# Patient Record
Sex: Male | Born: 1940 | Race: White | Hispanic: No | Marital: Married | Smoking: Never smoker
Health system: Southern US, Community
[De-identification: ages and names within clinical notes are randomized; demographics above are authoritative.]

## PROBLEM LIST (undated history)

## (undated) DIAGNOSIS — K922 Gastrointestinal hemorrhage, unspecified: Secondary | ICD-10-CM

## (undated) DIAGNOSIS — I878 Other specified disorders of veins: Secondary | ICD-10-CM

## (undated) DIAGNOSIS — I5022 Chronic systolic (congestive) heart failure: Secondary | ICD-10-CM

## (undated) DIAGNOSIS — G4733 Obstructive sleep apnea (adult) (pediatric): Secondary | ICD-10-CM

## (undated) DIAGNOSIS — Z7289 Other problems related to lifestyle: Secondary | ICD-10-CM

## (undated) DIAGNOSIS — I10 Essential (primary) hypertension: Secondary | ICD-10-CM

## (undated) DIAGNOSIS — I482 Chronic atrial fibrillation, unspecified: Secondary | ICD-10-CM

## (undated) DIAGNOSIS — Z789 Other specified health status: Secondary | ICD-10-CM

## (undated) DIAGNOSIS — I499 Cardiac arrhythmia, unspecified: Secondary | ICD-10-CM

## (undated) DIAGNOSIS — L0390 Cellulitis, unspecified: Secondary | ICD-10-CM

## (undated) HISTORY — DX: Other specified health status: Z78.9

## (undated) HISTORY — DX: Cellulitis, unspecified: L03.90

## (undated) HISTORY — DX: Cardiac arrhythmia, unspecified: I49.9

## (undated) HISTORY — DX: Other problems related to lifestyle: Z72.89

## (undated) HISTORY — DX: Obstructive sleep apnea (adult) (pediatric): G47.33

## (undated) HISTORY — PX: ABDOMINAL SURGERY: SHX537

---

## 2017-06-04 ENCOUNTER — Inpatient Hospital Stay
Admission: EM | Admit: 2017-06-04 | Discharge: 2017-06-10 | DRG: 291 | Disposition: A | Discharge status: Home Health | Payer: Medicare Other | Attending: Family Medicine | Admitting: Family Medicine

## 2017-06-04 ENCOUNTER — Emergency Department: Payer: Medicare Other

## 2017-06-04 ENCOUNTER — Other Ambulatory Visit: Payer: Self-pay

## 2017-06-04 DIAGNOSIS — J918 Pleural effusion in other conditions classified elsewhere: Secondary | ICD-10-CM | POA: Diagnosis present

## 2017-06-04 DIAGNOSIS — J939 Pneumothorax, unspecified: Secondary | ICD-10-CM

## 2017-06-04 DIAGNOSIS — R739 Hyperglycemia, unspecified: Secondary | ICD-10-CM | POA: Diagnosis not present

## 2017-06-04 DIAGNOSIS — R0603 Acute respiratory distress: Secondary | ICD-10-CM | POA: Diagnosis present

## 2017-06-04 DIAGNOSIS — R609 Edema, unspecified: Secondary | ICD-10-CM | POA: Diagnosis not present

## 2017-06-04 DIAGNOSIS — J90 Pleural effusion, not elsewhere classified: Secondary | ICD-10-CM

## 2017-06-04 DIAGNOSIS — I481 Persistent atrial fibrillation: Secondary | ICD-10-CM | POA: Diagnosis present

## 2017-06-04 DIAGNOSIS — M7989 Other specified soft tissue disorders: Secondary | ICD-10-CM | POA: Diagnosis present

## 2017-06-04 DIAGNOSIS — I4891 Unspecified atrial fibrillation: Secondary | ICD-10-CM | POA: Diagnosis not present

## 2017-06-04 DIAGNOSIS — Z888 Allergy status to other drugs, medicaments and biological substances status: Secondary | ICD-10-CM

## 2017-06-04 DIAGNOSIS — L03116 Cellulitis of left lower limb: Secondary | ICD-10-CM | POA: Diagnosis present

## 2017-06-04 DIAGNOSIS — Z8719 Personal history of other diseases of the digestive system: Secondary | ICD-10-CM

## 2017-06-04 DIAGNOSIS — S91209A Unspecified open wound of unspecified toe(s) with damage to nail, initial encounter: Secondary | ICD-10-CM | POA: Diagnosis present

## 2017-06-04 DIAGNOSIS — J189 Pneumonia, unspecified organism: Secondary | ICD-10-CM | POA: Diagnosis present

## 2017-06-04 DIAGNOSIS — Z95828 Presence of other vascular implants and grafts: Secondary | ICD-10-CM

## 2017-06-04 DIAGNOSIS — E876 Hypokalemia: Secondary | ICD-10-CM | POA: Diagnosis not present

## 2017-06-04 DIAGNOSIS — I878 Other specified disorders of veins: Secondary | ICD-10-CM | POA: Diagnosis present

## 2017-06-04 DIAGNOSIS — I5033 Acute on chronic diastolic (congestive) heart failure: Secondary | ICD-10-CM | POA: Diagnosis not present

## 2017-06-04 DIAGNOSIS — X58XXXA Exposure to other specified factors, initial encounter: Secondary | ICD-10-CM | POA: Diagnosis present

## 2017-06-04 DIAGNOSIS — L03115 Cellulitis of right lower limb: Secondary | ICD-10-CM | POA: Diagnosis present

## 2017-06-04 DIAGNOSIS — I482 Chronic atrial fibrillation: Secondary | ICD-10-CM | POA: Diagnosis present

## 2017-06-04 DIAGNOSIS — L089 Local infection of the skin and subcutaneous tissue, unspecified: Secondary | ICD-10-CM | POA: Diagnosis present

## 2017-06-04 DIAGNOSIS — F1010 Alcohol abuse, uncomplicated: Secondary | ICD-10-CM | POA: Diagnosis present

## 2017-06-04 DIAGNOSIS — R0600 Dyspnea, unspecified: Secondary | ICD-10-CM | POA: Diagnosis not present

## 2017-06-04 DIAGNOSIS — E8809 Other disorders of plasma-protein metabolism, not elsewhere classified: Secondary | ICD-10-CM | POA: Diagnosis not present

## 2017-06-04 DIAGNOSIS — I110 Hypertensive heart disease with heart failure: Secondary | ICD-10-CM | POA: Diagnosis present

## 2017-06-04 DIAGNOSIS — J95811 Postprocedural pneumothorax: Secondary | ICD-10-CM | POA: Diagnosis not present

## 2017-06-04 DIAGNOSIS — I509 Heart failure, unspecified: Secondary | ICD-10-CM

## 2017-06-04 HISTORY — DX: Chronic atrial fibrillation, unspecified: I48.20

## 2017-06-04 HISTORY — DX: Other specified disorders of veins: I87.8

## 2017-06-04 HISTORY — DX: Chronic systolic (congestive) heart failure: I50.22

## 2017-06-04 HISTORY — DX: Essential (primary) hypertension: I10

## 2017-06-04 HISTORY — DX: Gastrointestinal hemorrhage, unspecified: K92.2

## 2017-06-04 LAB — CBC WITH DIFFERENTIAL/PLATELET
BASOS ABS: 0.1 10*3/uL (ref 0–0.1)
Basophils Relative: 1 %
EOS ABS: 0.2 10*3/uL (ref 0–0.7)
EOS PCT: 3 %
HCT: 29.9 % — ABNORMAL LOW (ref 40.0–52.0)
HEMOGLOBIN: 9.6 g/dL — AB (ref 13.0–18.0)
Lymphocytes Relative: 15 %
Lymphs Abs: 0.9 10*3/uL — ABNORMAL LOW (ref 1.0–3.6)
MCH: 29.2 pg (ref 26.0–34.0)
MCHC: 32.2 g/dL (ref 32.0–36.0)
MCV: 90.6 fL (ref 80.0–100.0)
Monocytes Absolute: 0.6 10*3/uL (ref 0.2–1.0)
Monocytes Relative: 9 %
NEUTROS PCT: 72 %
Neutro Abs: 4.5 10*3/uL (ref 1.4–6.5)
PLATELETS: 177 10*3/uL (ref 150–440)
RBC: 3.3 MIL/uL — AB (ref 4.40–5.90)
RDW: 15 % — ABNORMAL HIGH (ref 11.5–14.5)
WBC: 6.3 10*3/uL (ref 3.8–10.6)

## 2017-06-04 LAB — COMPREHENSIVE METABOLIC PANEL
ALK PHOS: 57 U/L (ref 38–126)
ALT: 6 U/L — AB (ref 17–63)
AST: 20 U/L (ref 15–41)
Albumin: 2.7 g/dL — ABNORMAL LOW (ref 3.5–5.0)
Anion gap: 8 (ref 5–15)
BUN: 15 mg/dL (ref 6–20)
CHLORIDE: 106 mmol/L (ref 101–111)
CO2: 26 mmol/L (ref 22–32)
CREATININE: 0.91 mg/dL (ref 0.61–1.24)
Calcium: 8.9 mg/dL (ref 8.9–10.3)
GFR calc Af Amer: >60 mL/min (ref >60)
GFR calc non Af Amer: >60 mL/min (ref >60)
GLUCOSE: 117 mg/dL — AB (ref 65–99)
Potassium: 3.8 mmol/L (ref 3.5–5.1)
SODIUM: 140 mmol/L (ref 135–145)
Total Bilirubin: 0.8 mg/dL (ref 0.3–1.2)
Total Protein: 7 g/dL (ref 6.5–8.1)

## 2017-06-04 LAB — TROPONIN I
Troponin I: <0.03 ng/mL (ref <0.03)
Troponin I: <0.03 ng/mL (ref <0.03)

## 2017-06-04 LAB — BRAIN NATRIURETIC PEPTIDE: B Natriuretic Peptide: 146 pg/mL — ABNORMAL HIGH (ref 0.0–100.0)

## 2017-06-04 MED ORDER — CARVEDILOL 6.25 MG PO TABS
3.1250 mg | ORAL_TABLET | Freq: Two times a day (BID) | ORAL | Status: DC
  Administered 2017-06-04 – 2017-06-05 (×2): 3.125 mg via ORAL
  Filled 2017-06-04 (×2): qty 1

## 2017-06-04 MED ORDER — SODIUM CHLORIDE 0.9% FLUSH
3.0000 mL | INTRAVENOUS | Status: DC | PRN
  Administered 2017-06-07 – 2017-06-09 (×2): 3 mL via INTRAVENOUS
  Filled 2017-06-04 (×2): qty 3

## 2017-06-04 MED ORDER — LISINOPRIL 5 MG PO TABS
2.5000 mg | ORAL_TABLET | Freq: Every day | ORAL | Status: DC
  Administered 2017-06-04 – 2017-06-05 (×2): 2.5 mg via ORAL
  Filled 2017-06-04 (×2): qty 1

## 2017-06-04 MED ORDER — POLYETHYLENE GLYCOL 3350 17 G PO PACK: 17.0000 g | PACK | Freq: Every day | ORAL | Status: DC | PRN

## 2017-06-04 MED ORDER — METOPROLOL TARTRATE 5 MG/5ML IV SOLN: 2.5000 mg | Freq: Four times a day (QID) | INTRAVENOUS | Status: DC | PRN

## 2017-06-04 MED ORDER — IPRATROPIUM-ALBUTEROL 0.5-2.5 (3) MG/3ML IN SOLN
3.0000 mL | RESPIRATORY_TRACT | Status: DC | PRN
Start: 2017-06-04 — End: 2017-06-10

## 2017-06-04 MED ORDER — METOLAZONE 5 MG PO TABS
5.0000 mg | ORAL_TABLET | Freq: Every day | ORAL | Status: DC
  Administered 2017-06-04: 5 mg via ORAL
  Filled 2017-06-04 (×2): qty 1

## 2017-06-04 MED ORDER — ENOXAPARIN SODIUM 40 MG/0.4ML ~~LOC~~ SOLN
40.0000 mg | SUBCUTANEOUS | Status: DC
  Administered 2017-06-04: 40 mg via SUBCUTANEOUS
  Filled 2017-06-04: qty 0.4

## 2017-06-04 MED ORDER — ONDANSETRON HCL 4 MG PO TABS: 4.0000 mg | ORAL_TABLET | Freq: Four times a day (QID) | ORAL | Status: DC | PRN

## 2017-06-04 MED ORDER — HYDROCODONE-ACETAMINOPHEN 5-325 MG PO TABS: 1.0000 | ORAL_TABLET | ORAL | Status: DC | PRN

## 2017-06-04 MED ORDER — BISACODYL 10 MG RE SUPP: 10.0000 mg | Freq: Every day | RECTAL | Status: DC | PRN

## 2017-06-04 MED ORDER — ACETAMINOPHEN 650 MG RE SUPP: 650.0000 mg | Freq: Four times a day (QID) | RECTAL | Status: DC | PRN

## 2017-06-04 MED ORDER — SODIUM CHLORIDE 0.9 % IV SOLN: 250.0000 mL | INTRAVENOUS | Status: DC | PRN

## 2017-06-04 MED ORDER — ONDANSETRON HCL 4 MG/2ML IJ SOLN: 4.0000 mg | Freq: Four times a day (QID) | INTRAMUSCULAR | Status: DC | PRN

## 2017-06-04 MED ORDER — DOCUSATE SODIUM 100 MG PO CAPS
100.0000 mg | ORAL_CAPSULE | Freq: Two times a day (BID) | ORAL | Status: DC
  Administered 2017-06-05 – 2017-06-07 (×3): 100 mg via ORAL
  Filled 2017-06-04 (×6): qty 1

## 2017-06-04 MED ORDER — FUROSEMIDE 10 MG/ML IJ SOLN
40.0000 mg | Freq: Two times a day (BID) | INTRAMUSCULAR | Status: DC
  Administered 2017-06-04 – 2017-06-07 (×5): 40 mg via INTRAVENOUS
  Filled 2017-06-04 (×5): qty 4

## 2017-06-04 MED ORDER — ASPIRIN EC 81 MG PO TBEC
81.0000 mg | DELAYED_RELEASE_TABLET | Freq: Every day | ORAL | Status: DC
  Filled 2017-06-04: qty 1

## 2017-06-04 MED ORDER — DILTIAZEM HCL 100 MG IV SOLR
5.0000 mg/h | INTRAVENOUS | Status: DC
  Administered 2017-06-04: 5 mg/h via INTRAVENOUS
  Filled 2017-06-04: qty 100

## 2017-06-04 MED ORDER — CEFAZOLIN SODIUM-DEXTROSE 2-4 GM/100ML-% IV SOLN
2.0000 g | Freq: Four times a day (QID) | INTRAVENOUS | Status: DC
Start: 2017-06-04 — End: 2017-06-07
  Administered 2017-06-04 – 2017-06-07 (×12): 2 g via INTRAVENOUS
  Filled 2017-06-04 (×16): qty 100

## 2017-06-04 MED ORDER — SODIUM CHLORIDE 0.9% FLUSH
3.0000 mL | Freq: Two times a day (BID) | INTRAVENOUS | Status: DC
  Administered 2017-06-05 – 2017-06-07 (×2): 3 mL via INTRAVENOUS

## 2017-06-04 MED ORDER — ACETAMINOPHEN 325 MG PO TABS
650.0000 mg | ORAL_TABLET | Freq: Four times a day (QID) | ORAL | Status: DC | PRN
  Administered 2017-06-07: 650 mg via ORAL
  Filled 2017-06-04: qty 2

## 2017-06-04 MED ORDER — FUROSEMIDE 10 MG/ML IJ SOLN
40.0000 mg | Freq: Once | INTRAMUSCULAR | Status: AC
  Administered 2017-06-04: 40 mg via INTRAVENOUS
  Filled 2017-06-04: qty 4

## 2017-06-04 NOTE — H&P (Signed)
[employer] - [place] at [facility]   PATIENT NAME: Carlos Roach    MR#:  [id number]  DATE OF BIRTH:  Feb 05, 1941  DATE OF ADMISSION:  06/04/2017  PRIMARY CARE PHYSICIAN: Patient, No Pcp Per   REQUESTING/REFERRING PHYSICIAN:   CHIEF COMPLAINT:   Chief Complaint  Patient presents with  . Cough  . Leg Swelling    HISTORY OF PRESENT ILLNESS: Zeyad Delaguila  is a 77 y.o. male with a known history per below, recently discharged to inpatient rehab from hospital, [facility] home [facility] EMS for 3-week history of progressive cough, worsening lower extremity swelling, has gained up to 5 pounds of fluid per day, in the emergency room patient was found to be tachycardic, tachypneic, BNP 146, EKG noted for A. fib with RVR, chest x-ray noted for heart failure, patient evaluated emergency room, wife at the bedside, clinical exam noted for marked pitting edema up to the thigh bilaterally, avulsed right great toenail with associated mild infection, bilateral lower extremity cellulitis, patient is now been admitted for acute on chronic congestive heart failure exacerbation, bilateral lower extremity cellulitis, A. fib with RVR, and avulsed right great toenail with infection noted  PAST MEDICAL HISTORY:   Past Medical History:  Diagnosis Date  . Atrial fibrillation (HCC)   . Hypertension     PAST SURGICAL HISTORY: PICC line right arm  SOCIAL HISTORY:  Social History   Tobacco Use  . Smoking status: Never Smoker  . Smokeless tobacco: Never Used  Substance Use Topics  . Alcohol use: Yes    Comment: none for 3 weeks    FAMILY HISTORY: HTN  DRUG ALLERGIES:  Allergies  Allergen Reactions  . Mucinex [Guaifenesin Er] Rash    REVIEW OF SYSTEMS:   CONSTITUTIONAL: No fever, +fatigue/weakness.  EYES: No blurred or double vision.  EARS, NOSE, AND THROAT: No tinnitus or ear pain.  RESPIRATORY: + cough, shortness of breath, no wheezing or hemoptysis.  CARDIOVASCULAR:  No chest pain, orthopnea, + edema.  GASTROINTESTINAL: No nausea, vomiting, diarrhea or abdominal pain.  GENITOURINARY: No dysuria, hematuria.  ENDOCRINE: No polyuria, nocturia,  HEMATOLOGY: No anemia, easy bruising or bleeding SKIN: No rash or lesion. MUSCULOSKELETAL: No joint pain or arthritis.   NEUROLOGIC: No tingling, numbness, weakness.  PSYCHIATRY: No anxiety or depression.   MEDICATIONS AT HOME:  Prior to Admission medications   Not on File      PHYSICAL EXAMINATION:   VITAL SIGNS: Blood pressure 115/81, pulse (!) 126, temperature 98.3 F (36.8 C), temperature source Oral, resp. rate 18, height 6' (1.829 m), weight 124.7 kg (275 lb), SpO2 98 %.  GENERAL:  77 y.o.-year-old patient lying in the bed with no acute distress.  Obese, frail appearing EYES: Pupils equal, round, reactive to light and accommodation. No scleral icterus. Extraocular muscles intact.  HEENT: Head atraumatic, normocephalic. Oropharynx and nasopharynx clear.  NECK:  Supple, no jugular venous distention. No thyroid enlargement, no tenderness.  LUNGS: Bilateral diminished breath sounds-worse at bases. No use of accessory muscles of respiration.  CARDIOVASCULAR: S1, S2 normal. No murmurs, rubs, or gallops.  ABDOMEN: Soft, nontender, nondistended. Bowel sounds present. No organomegaly or mass.  EXTREMITIES: Marked bilateral lower extremity pitting edema up to bilateral thighs,no  cyanosis, or clubbing.  NEUROLOGIC: Cranial nerves II through XII are intact. Muscle strength 5/5 in all extremities. Sensation intact. Gait not checked.  PSYCHIATRIC: The patient is alert and oriented x 3.  SKIN: No obvious rash, lesion, or ulcer.  Avulsed right great  toe nail with mild infection noted, bilateral lower extremity cellulitis just below the knee  LABORATORY PANEL:   CBC Recent Labs  Lab 06/04/17 1035  WBC 6.3  HGB 9.6*  HCT 29.9*  PLT 177  MCV 90.6  MCH 29.2  MCHC 32.2  RDW 15.0*  LYMPHSABS 0.9*  MONOABS 0.6   EOSABS 0.2  BASOSABS 0.1   ------------------------------------------------------------------------------------------------------------------  Chemistries  Recent Labs  Lab 06/04/17 1035  NA 140  K 3.8  CL 106  CO2 26  GLUCOSE 117*  BUN 15  CREATININE 0.91  CALCIUM 8.9  AST 20  ALT 6*  ALKPHOS 57  BILITOT 0.8   ------------------------------------------------------------------------------------------------------------------ estimated creatinine clearance is 95.6 mL/min (by C-G formula based on SCr of 0.91 mg/dL). ------------------------------------------------------------------------------------------------------------------ No results for input(s): TSH, T4TOTAL, T3FREE, THYROIDAB in the last 72 hours.  Invalid input(s): FREET3   Coagulation profile No results for input(s): INR, PROTIME in the last 168 hours. ------------------------------------------------------------------------------------------------------------------- No results for input(s): DDIMER in the last 72 hours. -------------------------------------------------------------------------------------------------------------------  Cardiac Enzymes Recent Labs  Lab 06/04/17 1035  TROPONINI <0.03   ------------------------------------------------------------------------------------------------------------------ Invalid input(s): POCBNP  ---------------------------------------------------------------------------------------------------------------  Urinalysis No results found for: COLORURINE, APPEARANCEUR, LABSPEC, PHURINE, GLUCOSEU, HGBUR, BILIRUBINUR, KETONESUR, PROTEINUR, UROBILINOGEN, NITRITE, LEUKOCYTESUR   RADIOLOGY: Dg Chest 2 View  Result Date: 06/04/2017 CLINICAL DATA:  Nonproductive cough bilateral lower extremity swelling for 2 weeks. EXAM: CHEST  2 VIEW COMPARISON:  None. FINDINGS: Right upper extremity PIC C. tip is at the cavoatrial junction. Moderate cardiomegaly. Vascular congestion.  Central and basilar pulmonary edema. Left pleural effusion. Round opacity projects inferior to the hilum on the lateral view of unknown significance. IMPRESSION: Findings are most consistent with CHF. Right upper extremity PICC. Tip is at the cavoatrial junction. Round opacity projects over the lower lung zones. Follow-up chest radiographs are recommend Electronically Signed   By: Jolaine ClickArthur  Hoss M.D.   On: 06/04/2017 11:09    EKG: Orders placed or performed during the hospital encounter of 06/04/17  . EKG 12-Lead  . EKG 12-Lead  . EKG 12-Lead  . EKG 12-Lead    IMPRESSION AND PLAN: 1 acute on chronic congestive heart failure exacerbation Compounded by A. fib with RVR Admit to telemetry bed, IV Lasix twice daily, Zaroxolyn daily, BMP every a.m., strict I&O monitoring, daily weights, low-sodium/heart healthy diet with fluid restriction, aspirin, check lipids in the morning, check echocardiogram, cardiology consult for expert opinion, check blood/urine cultures, lisinopril, complete MAR when available  2 acute A. fib with RVR Most likely exacerbated by above  Rule out acute coronary syndrome with cardiac enzymes x3 sets, Cardizem drip for now, and all other plans as stated above  3 acute bilateral lower extremity cellulitis Exacerbated by edema and noted avulsed right great toenail with associated infection IV Ancef for 7-day course, check wound cultures, blood cultures, and continue close medical monitoring  4 acute avulsed right great toenail With associated infection noted Podiatry to see  5 chronic benign essential hypertension Stable Vitals per routine and make changes as per necessary  6 incomplete MAR Complete when available  7 history of right arm PICC line Stable Per patient-Place for pneumonia treatment Chest x-ray noted for heart failure, bilateral lower lobe opacities-recommended repeat chest x-ray for further evaluation Continue Ancef as stated above, check two-view  chest x-ray in the morning for reevaluation as chest x-ray findings may be primarily heart failure nature  Disposition home in the care of wife in 1-3 days barring any complications, the wife is not interested  in having the patient be sent back to rest home/inpatient rehab  All the records are reviewed and case discussed with ED provider. Management plans discussed with the patient, family and they are in agreement.  CODE STATUS:full Code Status History    This patient does not have a recorded code status. Please follow your organizational policy for patients in this situation.    Advance Directive Documentation     Most Recent Value  Type of Advance Directive  Living will  Pre-existing out of facility DNR order (yellow form or pink MOST form)  No data  "MOST" Form in Place?  No data       TOTAL TIME TAKING CARE OF THIS PATIENT: 45 minutes.    Evelena Asa Salary M.D on 06/04/2017   Between 7am to 6pm - Pager - [phone]  After 6pm go to [email or web address] - password [email or web address]  [facility]  Office  [phone]  CC: Primary care physician; Patient, No Pcp Per   Note: This dictation was prepared with Dragon dictation along with smaller phrase technology. Any transcriptional errors that result from this process are unintentional.

## 2017-06-04 NOTE — Care Management (Signed)
Admitted for CHF and atrial fib with RVR. Cardizem drip and IV lasix.  Presented from [facility]  and it id documented wife does not want him to return to facility. [place] has a [place]  address and no insurance listed.  CM will investigate.  Patient receiving IV antibiotics for cellulitis lower extremities

## 2017-06-04 NOTE — ED Provider Notes (Signed)
[facility] Emergency Department Provider Note  ___________________________________________   First MD Initiated Contact with Patient 06/04/17 1035     (approximate)  I have reviewed the triage vital signs and the nursing notes.   HISTORY  Chief Complaint Cough and Leg Swelling  HPI Carlos Roach is a 77 y.o. male with a history of atrial fibrillation as well as hypertension currently with a PICC line being treated for pneumonia who is presenting to the emergency department today with cough as well as fluid retention and weight gain.  The patient says that he has the cough ongoing now for several weeks but is improving.  He denies any sputum production.  Denies any chest pain or shortness of breath.  However, he states that he is gaining about 5 pounds per day and has persistent lower extremity edema.  Denies any pain to the bilateral lower extremities.  Denies any fever.  Past Medical History:  Diagnosis Date  . Atrial fibrillation (HCC)   . Hypertension     There are no active problems to display for this patient.     Prior to Admission medications   Not on File    Allergies Mucinex [guaifenesin er]  No family history on file.  Social History Social History   Tobacco Use  . Smoking status: Never Smoker  . Smokeless tobacco: Never Used  Substance Use Topics  . Alcohol use: Yes    Comment: none for 3 weeks  . Drug use: No    Review of Systems  Constitutional: No fever/chills Eyes: No visual changes. ENT: No sore throat. Cardiovascular: Denies chest pain. Respiratory: As above Gastrointestinal: No abdominal pain.  No nausea, no vomiting.  No diarrhea.  No constipation. Genitourinary: Negative for dysuria. Musculoskeletal: Negative for back pain. Skin: Negative for rash. Neurological: Negative for headaches, focal weakness or numbness.   ____________________________________________   PHYSICAL EXAM:  VITAL SIGNS: ED Triage  Vitals  Enc Vitals Group     BP 06/04/17 1019 115/82     Pulse Rate 06/04/17 1019 (!) 119     Resp 06/04/17 1019 (!) 30     Temp 06/04/17 1019 98.3 F (36.8 C)     Temp Source 06/04/17 1019 Oral     SpO2 06/04/17 1019 95 %     Weight 06/04/17 1020 275 lb (124.7 kg)     Height 06/04/17 1020 6' (1.829 m)     Head Circumference --      Peak Flow --      Pain Score --      Pain Loc --      Pain Edu? --      Excl. in GC? --     Constitutional: Alert and oriented. Well appearing and in no acute distress. Eyes: Conjunctivae are normal.  Head: Atraumatic. Nose: No congestion/rhinnorhea. Mouth/Throat: Mucous membranes are moist.  Neck: No stridor.   Cardiovascular: Irregularly irregular rhythm.  Tachycardic.  Grossly normal heart sounds. Respiratory: Severely decreased lung sounds throughout with expiratory cough.  No audible rales or wheezes. Gastrointestinal: Soft and nontender. No distention. No CVA tenderness. Musculoskeletal: Bilateral lower extremity edema that is pitting up to the mid thighs. Neurologic:  Normal speech and language. No gross focal neurologic deficits are appreciated. Skin:  Skin is warm, dry and intact. No rash noted. Psychiatric: Mood and affect are normal. Speech and behavior are normal.  ____________________________________________   LABS (all labs ordered are listed, but only abnormal results are displayed)  Labs Reviewed  CBC WITH DIFFERENTIAL/PLATELET - Abnormal; Notable for the following components:      Result Value   RBC 3.30 (*)    Hemoglobin 9.6 (*)    HCT 29.9 (*)    RDW 15.0 (*)    Lymphs Abs 0.9 (*)    All other components within normal limits  COMPREHENSIVE METABOLIC PANEL - Abnormal; Notable for the following components:   Glucose, Bld 117 (*)    Albumin 2.7 (*)    ALT 6 (*)    All other components within normal limits  BRAIN NATRIURETIC PEPTIDE - Abnormal; Notable for the following components:   B Natriuretic Peptide 146.0 (*)     All other components within normal limits  TROPONIN I   ____________________________________________  EKG  ED ECG REPORT I, Arelia Longestavid M Yaiden Yang, the attending physician, personally viewed and interpreted this ECG.   Date: 06/04/2017  EKG Time: 1020  Rate: 103  Rhythm: atrial fibrillation, rate 103  Axis: Normal  Intervals:left anterior fascicular block  ST&T Change: No ST segment elevation or depression.  T wave inversions in 1 as well as aVL.  ____________________________________________  RADIOLOGY  Findings consistent with CHF.  PICC tip at cavoatrial junction.  Round opacity over the lung zones in the lower fields.  Follow-up chest radiographs are recommended. ____________________________________________   PROCEDURES  Procedure(s) performed:   Procedures  Critical Care performed:   ____________________________________________   INITIAL IMPRESSION / ASSESSMENT AND PLAN / ED COURSE  Pertinent labs & imaging results that were available during my care of the patient were reviewed by me and considered in my medical decision making (see chart for details).  Differential includes, but is not limited to, viral syndrome, bronchitis including COPD exacerbation, pneumonia, reactive airway disease including asthma, CHF including exacerbation with or without pulmonary/interstitial edema, pneumothorax, ACS, thoracic trauma, and pulmonary embolism. As part of my medical decision making, I reviewed the following data within the electronic MEDICAL RECORD NUMBER Old chart reviewed    ----------------------------------------- 1:38 PM on 06/04/2017 -----------------------------------------  Patient's wife now at the bedside.  She says that, contradicting the patient, that his cough has been worse and that he is now unable to go to medical procedures secondary to decompensation of his medical conditions.  Patient's wife also says that his blood pressure has been in the 90s and this is why  he has not been able to 6.  Patient will be admitted to the hospital because of his worsening respiratory status as well as the need for close monitoring of his vital signs along with his diuresis.  Patient is understanding of the plan willing to comply.  Signed up with Dr. Katheren ShamsSalary.   ____________________________________________   FINAL CLINICAL IMPRESSION(S) / ED DIAGNOSES  CHF.  Peripheral edema.    NEW MEDICATIONS STARTED DURING THIS VISIT:  New Prescriptions   No medications on file     Note:  This document was prepared using Dragon voice recognition software and may include unintentional dictation errors.     Myrna BlazerSchaevitz, Elester Apodaca Matthew, MD 06/04/17 [phone]

## 2017-06-04 NOTE — ED Triage Notes (Signed)
Pt arrived from [facility] via [facility] c/o 3 week history of cough and swelling in legs and feet, states he is gaining 5lbs a day in fluid

## 2017-06-04 NOTE — ED Notes (Signed)
Returned from XR 

## 2017-06-05 ENCOUNTER — Encounter: Payer: Self-pay | Admitting: Physician Assistant

## 2017-06-05 ENCOUNTER — Inpatient Hospital Stay: Payer: Medicare Other

## 2017-06-05 ENCOUNTER — Inpatient Hospital Stay: Admit: 2017-06-05 | Payer: Medicare Other

## 2017-06-05 LAB — URINALYSIS, COMPLETE (UACMP) WITH MICROSCOPIC
BILIRUBIN URINE: NEGATIVE
Glucose, UA: NEGATIVE mg/dL
Ketones, ur: NEGATIVE mg/dL
NITRITE: NEGATIVE
Protein, ur: 30 mg/dL — AB
SPECIFIC GRAVITY, URINE: 1.016 (ref 1.005–1.030)
pH: 5 (ref 5.0–8.0)

## 2017-06-05 LAB — MRSA PCR SCREENING: MRSA by PCR: NEGATIVE

## 2017-06-05 LAB — BASIC METABOLIC PANEL
Anion gap: 9 (ref 5–15)
BUN: 13 mg/dL (ref 6–20)
CO2: 28 mmol/L (ref 22–32)
CREATININE: 1.01 mg/dL (ref 0.61–1.24)
Calcium: 8.7 mg/dL — ABNORMAL LOW (ref 8.9–10.3)
Chloride: 100 mmol/L — ABNORMAL LOW (ref 101–111)
GFR calc Af Amer: >60 mL/min (ref >60)
GLUCOSE: 103 mg/dL — AB (ref 65–99)
POTASSIUM: 3 mmol/L — AB (ref 3.5–5.1)
SODIUM: 137 mmol/L (ref 135–145)

## 2017-06-05 LAB — TROPONIN I

## 2017-06-05 LAB — LIPID PANEL
CHOLESTEROL: 86 mg/dL (ref 0–200)
HDL: 34 mg/dL — ABNORMAL LOW (ref >40)
LDL Cholesterol: 41 mg/dL (ref 0–99)
Total CHOL/HDL Ratio: 2.5 RATIO
Triglycerides: 56 mg/dL (ref <150)
VLDL: 11 mg/dL (ref 0–40)

## 2017-06-05 LAB — MAGNESIUM: MAGNESIUM: 1.6 mg/dL — AB (ref 1.7–2.4)

## 2017-06-05 MED ORDER — POTASSIUM CHLORIDE CRYS ER 20 MEQ PO TBCR
40.0000 meq | EXTENDED_RELEASE_TABLET | Freq: Two times a day (BID) | ORAL | Status: DC
  Administered 2017-06-05 – 2017-06-07 (×6): 40 meq via ORAL
  Filled 2017-06-05 (×6): qty 2

## 2017-06-05 MED ORDER — METOPROLOL TARTRATE 25 MG PO TABS
25.0000 mg | ORAL_TABLET | Freq: Two times a day (BID) | ORAL | Status: DC
  Administered 2017-06-05 – 2017-06-10 (×11): 25 mg via ORAL
  Filled 2017-06-05 (×11): qty 1

## 2017-06-05 MED ORDER — SODIUM CHLORIDE 0.9% FLUSH
10.0000 mL | Freq: Two times a day (BID) | INTRAVENOUS | Status: DC
  Administered 2017-06-06 – 2017-06-09 (×8): 10 mL

## 2017-06-05 MED ORDER — SODIUM CHLORIDE 0.9% FLUSH: 10.0000 mL | INTRAVENOUS | Status: DC | PRN

## 2017-06-05 MED ORDER — DILTIAZEM HCL 30 MG PO TABS
30.0000 mg | ORAL_TABLET | Freq: Three times a day (TID) | ORAL | Status: DC
  Administered 2017-06-05 – 2017-06-06 (×4): 30 mg via ORAL
  Filled 2017-06-05 (×4): qty 1

## 2017-06-05 MED ORDER — MAGNESIUM SULFATE 2 GM/50ML IV SOLN
2.0000 g | Freq: Once | INTRAVENOUS | Status: AC
  Administered 2017-06-05: 2 g via INTRAVENOUS
  Filled 2017-06-05: qty 50

## 2017-06-05 MED ORDER — BENZONATATE 100 MG PO CAPS
200.0000 mg | ORAL_CAPSULE | Freq: Three times a day (TID) | ORAL | Status: DC
  Administered 2017-06-05 – 2017-06-10 (×15): 200 mg via ORAL
  Filled 2017-06-05 (×15): qty 2

## 2017-06-05 NOTE — Consult Note (Signed)
Is asked to evaluate right great toe for infection.  Patient had very loose right great toenail with mild erythema.  Was able to gently remove the remainder of the nail itself today.  No purulent drainage was noted.  Good health nailbed but the nail was quite loose and causing irritation at the base of the nail fold.  With Betadine and a sterile gauze was applied.  Continue to cover with antibiotic ointment and a Band-Aid daily.  No further follow-up needed.

## 2017-06-05 NOTE — Progress Notes (Signed)
PT Cancellation Note  Patient Details Name: Carlos Roach MRN: [id number] DOB: Apr 22, 1940   Cancelled Treatment:    Reason Eval/Treat Not Completed: Medical issues which prohibited therapy; Pt's Ka currently 3.0 and trending downward with current level falling outside guidelines for participation with PT services.  Will attempt to see pt at a future date/time as medically appropriate.    Ovidio Hanger. Scott Hulan Szumski PT, DPT 06/05/17, 12:35 PM

## 2017-06-05 NOTE — Care Management (Signed)
Spoke with patient and wife.  Obtained address in [place] and corrected demographics in Epic.  Does not want to return to [facility] because "the girl in the office said we would have to pay over 400 dollars a day and we don't have it. "  Patient  was admitted to snf 2/8 and receiving IV antibiotics for wounds on lower extremities via PICC.  If discharges home, would be agreeable to home health but patient does not have PCP.  [place] [facility] then [facility].  No preferences for PCP other than being associated with [facility] and in [place].  Made an appointment with Dr Lawson RadarSionne George's PA for march 11 12:45p.  Will ask physician advisor to sign home health orders and follow until patient gets established. At present physical therapy has not been able to evaluate and unsure if patient is going to need IV therapy at discharge.

## 2017-06-05 NOTE — Consult Note (Signed)
WOC Nurse wound consult note Reason for Consult:Lymphedema and swelling to lower legs.  Patient is off unit for an extended period.  Will see in the morning and assess for need for compression/treatment plan.  WOC team will follow. Maple Hudson.  Reeves Musick RN BSN CWON Pager [phone]

## 2017-06-05 NOTE — Progress Notes (Signed)
[employer] [facility] [place] [facility]                                                                                                                                                                                  Patient Demographics   Carlos Roach, is a 77 y.o. male, DOB - [birth year], WJX:[id number]  Admit date - 06/04/2017   Admitting Physician Bertrum SolMontell D Salary, MD  Outpatient Primary MD for the patient is Patient, No Pcp Per   LOS - 1  Subjective: Patient was [facility] he initially presented there with lower extremity cellulitis, subsequently developed severe upper GI bleed had to have embolization as well as an endoscopy which showed a mass which they were not able to biopsy.  Patient also developed acute CHF during that hospitalization.  Patient was discharged on oral antibiotics for lower extremity cellulitis however at the facility where he resides he was started on IV antibiotics for pneumonia.  Patient now admitted here with shortness of breath cough and congestion.     Review of Systems:   CONSTITUTIONAL: No documented fever. No fatigue, weakness. No weight gain, no weight loss.  EYES: No blurry or double vision.  ENT: No tinnitus. No postnasal drip. No redness of the oropharynx.  RESPIRATORY: Positive cough, no wheeze, no hemoptysis.  Positive dyspnea.  CARDIOVASCULAR: No chest pain. No orthopnea. No palpitations. No syncope.  GASTROINTESTINAL: No nausea, no vomiting or diarrhea. No abdominal pain. No melena or hematochezia.  GENITOURINARY: No dysuria or hematuria.  ENDOCRINE: No polyuria or nocturia. No heat or cold intolerance.  HEMATOLOGY: No anemia. No bruising. No bleeding.  INTEGUMENTARY: No rashes. No lesions.  MUSCULOSKELETAL: No arthritis. No swelling. No gout.  NEUROLOGIC: No numbness, tingling, or ataxia. No seizure-type activity.  PSYCHIATRIC: No anxiety. No insomnia. No ADD.    Vitals:   Vitals:   06/04/17  1740 06/04/17 1916 06/05/17 0333 06/05/17 0736  BP: 114/68 (!) 106/54 104/61 111/71  Pulse: 96 87 90 87  Resp: 18 17 18 18   Temp:  97.9 F (36.6 C) 97.7 F (36.5 C) 98.1 F (36.7 C)  TempSrc:  Oral Oral   SpO2: 97% 98% 97% 97%  Weight:   265 lb 9.6 oz (120.5 kg)   Height:        Wt Readings from Last 3 Encounters:  06/05/17 265 lb 9.6 oz (120.5 kg)     Intake/Output Summary (Last 24 hours) at 06/05/2017 1402 Last data filed at 06/05/2017 1347 Gross per 24 hour  Intake 520.67 ml  Output 3900 ml  Net -3379.33 ml    Physical Exam:   GENERAL: Pleasant-appearing in no apparent distress.  HEAD, EYES, EARS, NOSE AND THROAT: Atraumatic, normocephalic. Extraocular muscles are intact. Pupils equal and reactive to light. Sclerae anicteric. No conjunctival injection. No oro-pharyngeal erythema.  NECK: Supple. There is no jugular venous distention. No bruits, no lymphadenopathy, no thyromegaly.  HEART: Regular rate and rhythm,. No murmurs, no rubs, no clicks.  LUNGS: Rhonchus breath sounds bilaterally with diminished breath sounds at the left base ABDOMEN: Soft, flat, nontender, nondistended. Has good bowel sounds. No hepatosplenomegaly appreciated.  EXTREMITIES: No evidence of any cyanosis, clubbing, or peripheral edema.  +2 pedal and radial pulses bilaterally.  NEUROLOGIC: The patient is alert, awake, and oriented x3 with no focal motor or sensory deficits appreciated bilaterally.  SKIN: Moist and warm with no rashes appreciated.  Psych: Not anxious, depressed LN: No inguinal LN enlargement    Antibiotics   Anti-infectives (From admission, onward)   Start     Dose/Rate Route Frequency Ordered Stop   06/04/17 1600  ceFAZolin (ANCEF) IVPB 2g/100 mL premix     2 g 200 mL/hr over 30 Minutes Intravenous Every 6 hours 06/04/17 1357        Medications   Scheduled Meds: . diltiazem  30 mg Oral Q8H  . docusate sodium  100 mg Oral BID  . furosemide  40 mg Intravenous BID  .  metoprolol tartrate  25 mg Oral BID  . potassium chloride  40 mEq Oral BID  . sodium chloride flush  3 mL Intravenous Q12H   Continuous Infusions: . sodium chloride    .  ceFAZolin (ANCEF) IV Stopped (06/05/17 1338)   PRN Meds:.sodium chloride, acetaminophen **OR** acetaminophen, bisacodyl, HYDROcodone-acetaminophen, ipratropium-albuterol, metoprolol tartrate, ondansetron **OR** ondansetron (ZOFRAN) IV, polyethylene glycol, sodium chloride flush   Data Review:   Micro Results Recent Results (from the past 240 hour(s))  CULTURE, BLOOD (ROUTINE X 2) w Reflex to ID Panel     Status: None (Preliminary result)   Collection Time: 06/04/17  4:36 PM  Result Value Ref Range Status   Specimen Description BLOOD BLOOD LEFT ARM  Final   Special Requests   Final    BOTTLES DRAWN AEROBIC AND ANAEROBIC Blood Culture adequate volume   Culture   Final    NO GROWTH < 24 HOURS Performed at [facility], [address] [place], [place] [address]    Report Status PENDING  Incomplete  CULTURE, BLOOD (ROUTINE X 2) w Reflex to ID Panel     Status: None (Preliminary result)   Collection Time: 06/04/17  4:36 PM  Result Value Ref Range Status   Specimen Description BLOOD LEFT ANTECUBITAL  Final   Special Requests   Final    BOTTLES DRAWN AEROBIC AND ANAEROBIC Blood Culture adequate volume   Culture   Final    NO GROWTH < 24 HOURS Performed at [facility], [address] [place], [place] [address]    Report Status PENDING  Incomplete    Radiology Reports Dg Chest 2 View  Result Date: 06/04/2017 CLINICAL DATA:  Nonproductive cough bilateral lower extremity swelling for 2 weeks. EXAM: CHEST  2 VIEW COMPARISON:  None. FINDINGS: Right upper extremity PIC C. tip is at the cavoatrial junction. Moderate cardiomegaly. Vascular congestion. Central and basilar pulmonary edema. Left pleural effusion. Round opacity projects inferior to the hilum on the lateral view of unknown significance.  IMPRESSION: Findings are most consistent with CHF. Right upper extremity PICC. Tip is at the cavoatrial junction. Round opacity projects over the lower lung zones. Follow-up chest radiographs are recommend Electronically  Signed   By: Jolaine Click M.D.   On: 06/04/2017 11:09   Ct Chest Wo Contrast  Result Date: 06/05/2017 CLINICAL DATA:  Evaluate opacity identified on recent chest radiograph. EXAM: CT CHEST WITHOUT CONTRAST TECHNIQUE: Multidetector CT imaging of the chest was performed following the standard protocol without IV contrast. COMPARISON:  Chest radiograph 06/04/2017 FINDINGS: Cardiovascular: The heart size is enlarged. Trace pericardial fluid. Aortic atherosclerosis. Calcification in the LAD, left circumflex and RCA coronary arteries noted. Mediastinum/Nodes: Normal appearance of the thyroid gland. Normal appearance of the esophagus. Prominent mediastinal lymph nodes are identified without adenopathy. Index right paratracheal node measures 8 mm, image 67/2. Evaluation of the hilar regions limited due to lack of intravenous contrast material. Lungs/Pleura: There is a moderate left pleural effusion. Complete atelectasis and/or consolidation of the left lower lobe is identified. Small right pleural effusion identified. No interstitial edema identified. In the right upper lobe there is a small pulmonary nodule which measures 4 mm, image 58/3. Upper Abdomen: No acute abnormality. 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 Mild upper T-spine scoliosis noted. No suspicious bone lesions. IMPRESSION: 1. Moderate left pleural effusion with complete atelectasis and/or consolidation of the left lower lobe. Follow-up imaging is advised to ensure resolution of left lower lobe atelectasis/consolidation and rule out underlying malignancy. 2. Small right pleural effusion. 3. 4 mm right upper lobe pulmonary nodule. No follow-up needed if patient is low-risk. Non-contrast chest CT  can be considered in 12 months if patient is high-risk. This recommendation follows the consensus statement: Guidelines for Management of Incidental Pulmonary Nodules Detected on CT Images: From the Fleischner Society 2017; Radiology 2017; 284:228-243. 4. Cardiac enlargement, aortic atherosclerosis and 3 vessel coronary artery atherosclerotic calcifications. Aortic Atherosclerosis (ICD10-I70.0). Electronically Signed   By: Signa Kell M.D.   On: 06/05/2017 13:46     CBC Recent Labs  Lab 06/04/17 1035  WBC 6.3  HGB 9.6*  HCT 29.9*  PLT 177  MCV 90.6  MCH 29.2  MCHC 32.2  RDW 15.0*  LYMPHSABS 0.9*  MONOABS 0.6  EOSABS 0.2  BASOSABS 0.1    Chemistries  Recent Labs  Lab 06/04/17 1035 06/05/17 0147  NA 140 137  K 3.8 3.0*  CL 106 100*  CO2 26 28  GLUCOSE 117* 103*  BUN 15 13  CREATININE 0.91 1.01  CALCIUM 8.9 8.7*  MG  --  1.6*  AST 20  --   ALT 6*  --   ALKPHOS 57  --   BILITOT 0.8  --    ------------------------------------------------------------------------------------------------------------------ estimated creatinine clearance is 84.7 mL/min (by C-G formula based on SCr of 1.01 mg/dL). ------------------------------------------------------------------------------------------------------------------ No results for input(s): HGBA1C in the last 72 hours. ------------------------------------------------------------------------------------------------------------------ Recent Labs    06/05/17 0147  CHOL 86  HDL 34*  LDLCALC 41  TRIG 56  CHOLHDL 2.5   ------------------------------------------------------------------------------------------------------------------ No results for input(s): TSH, T4TOTAL, T3FREE, THYROIDAB in the last 72 hours.  Invalid input(s): FREET3 ------------------------------------------------------------------------------------------------------------------ No results for input(s): VITAMINB12, FOLATE, FERRITIN, TIBC, IRON, RETICCTPCT in  the last 72 hours.  Coagulation profile No results for input(s): INR, PROTIME in the last 168 hours.  No results for input(s): DDIMER in the last 72 hours.  Cardiac Enzymes Recent Labs  Lab 06/04/17 1636 06/04/17 1950 06/05/17 0147  TROPONINI <0.03 <0.03 <0.03   ------------------------------------------------------------------------------------------------------------------ Invalid input(s): POCBNP    Assessment & Plan   Patient 77 year old with complicated medical history presenting with shortness of breath   1.  Acute on chronic systolic  CHF recent echo showed EF of 50% which was done within the past month Continue therapy with IV Lasix he has responded to this  2.  Possible pneumonia I have ordered a CT scan which shows left-sided effusion Continue IV antibiotics for now I will discussed with patient regarding drainage of this tomorrow Check pro-calcitonin level  3.  A. fib with RVR continue oral Cardizem, continue metoprolol heart rate improved Due to recent massive GI bleed not a candidate for any type anticoagulation including aspirin  3.  Recent GI bleeding with a mass noted he needs to follow-up with [facility] for an endoscopy for definite diagnosis, Protonix twice daily  4.  Bilateral lower extremity venous stasis wound care consult also podiatry has been consulted for his foot  5.  Hypokalemia hypomagnesemia we will replace his electrolytes recheck in the a.m.  6.  Miscellaneous SCDs for DVT prophylaxis     Code Status Orders  (From admission, onward)        Start     Ordered   06/04/17 1358  Full code  Continuous     06/04/17 1357    Code Status History    Date Active Date Inactive Code Status Order ID Comments User Context   This patient has a current code status but no historical code status.    Advance Directive Documentation     Most Recent Value  Type of Advance Directive  Living will  Pre-existing out of facility DNR order (yellow form or  pink MOST form)  No data  "MOST" Form in Place?  No data           Consults cardiology  DVT Prophylaxis scd's  Lab Results  Component Value Date   PLT 177 06/04/2017     Time Spent in minutes   Greater than 50% of time spent in care coordination and counseling patient regarding the condition and plan of care.   Auburn Bilberry M.D on 06/05/2017 at 2:02 PM  Between 7am to 6pm - Pager - [phone]  After 6pm go to [email or web address] - password [email or web address] [facility]  [facility]   Office  [phone]

## 2017-06-05 NOTE — Progress Notes (Signed)
77 year old male with hx of chronic a-fib which in the past patient underwent DCCV.  Recently he has been rate controlled.  Patient not currently on Eliquis or any anticoagulation given his recent GI Bleed.  Patient  has hx of chronic combined CHF, chronic venous stasis, GI Bleed, hypokalemia, and chronic alcohol abuse.  Patient presented to ED with worsening SOB with weight gain of greater than 5 pounds overnight.  Last [facility] with EF of 50% [facility]  Active Problem List this admission: 1. Persistent A-fib with RVR 2. Acute on Chronic diastolic CHF 3. Chronic Venous Stasis 4. History of upper GI Bleed.   5. Hypokalemia 6. Hyperglycemia 7. Hypoalbuminemia 8. Anemia 9. HTN  CHF Education:?? Educational session with patient completed.  Provided patient with "Living Better with Heart Failure" packet. Briefly reviewed definition of heart failure and signs and symptoms of an exacerbation.  Explained to patient that HF is a chronic illness which requires self-assessment / self-management along with help from the cardiologist/PCP/HF Clinic.?? ? *Reviewed importance of and reason behind checking weight daily in the AM, after using the bathroom, but before getting dressed.?Patient has means to purchase scales.    Reviewed the following information with patient:  *Discussed when to call the Dr= weight gain of >2-3lb overnight of 5lb in a week,  *Discussed yellow zone= call MD: weight gain of >2-3lb overnight of 5lb in a week, increased swelling, increased SOB when lying down, chest discomfort, dizziness, increased fatigue *Red Zone= call 911: struggle to breath, fainting or near fainting, significant chest pain   *Reviewed low sodium diet-provided handout of recommended and not recommended foods. ?Reviewed reading labels with patient. Discussed fluid intake with patient as well. Patient informed this RN that his doctor told him not to drink more than 1500 ml per day  or 1.5 liters. ?Note: ?Dietitian Consultation entered for diet education on low sodium diet.  ? *Instructed patient to take medications as prescribed for heart failure. Explained briefly why pt is on the medications (either make you feel better, live longer or keep you out of the hospital) and discussed monitoring and side effects.   *Smoking Cessation?- Patient is a never smoker.   ?? *Exercise - [facility].  Physical Therapy consult pending.  ?? ? *[facility]- Explained the role of the [facility]. ?Explained to patient the HF Clinic does not replace his PCP/cardiologist, but is an additional resource to help him manage his HF and to keep him out of the hospital. ?Patient requested that I return when his wife is present in order to discuss this.  Will f/u with patient and wife in the morning.    Again, the 5 Steps to Living Better with Heart Failure were reviewed with patient. ?Patient thanked me for providing the above information. ?  Army Meliaiane Risa Auman, RN, BSN, [facility] Cardiovascular and Pulmonary Nurse Navigator

## 2017-06-05 NOTE — Clinical Social Work Note (Signed)
CSW spoke with patient and his wife who was at bedside about returning back to SNF for rehab.  Patient and his wife would like him to return home with home health.  Case manager is aware, CSW to sign off please reconsult if social work needs arise.  Ervin KnackEric R. Hassan Rowannterhaus, MSW, Theresia MajorsLCSWA [phone]  06/05/2017 5:49 PM

## 2017-06-05 NOTE — Consult Note (Signed)
Cardiology Consultation:   Patient ID: Carlos Roach; [id number]; May 28, 1940   Admit date: 06/04/2017 Date of Consult: 06/05/2017  Primary Care Provider: Patient, No Pcp Per Primary Cardiologist: [facility]   Patient Profile:   Carlos Roach is a 77 y.o. 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 Katheren Shams.  History of Present Illness:   Carlos Roach was previously followed by cardiology in the [place], [place] area, though more recently has moved this region. He reports being told he had an "irregular heart beat" approximately 10 years prior. He previously underwent remote DCCV, though more recently has been rate controlled given recurrence of Afib in the setting of alcohol abuse. He was previously anticoagulated with Eliquis, though this was held during his recent admission to [facility] as below.   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 His Eliquis was held given acute blood loss anemia from GI bleed. He required multiple transfusions. Echo showed an EF of 50%, mild LVH, trivial MR/PR/TR, small pericardial effusion. He was diuresed with IV Lasix with a discharge weight of 117 kg. He was scheduled for GI follow up given his duodenal mass with planned repeat EGD, though this is yet to be completed. He was discharged to rehab in [place]. While there, he continued to weight weight at ~ 3-5 pounds overnight prompting him to be  transported to [facility] for further evaluation. He has been noting a cough that is improving and productive of clear sputum. No chest pain or palpitations. His lower extremity swelling is improved from his [facility] admission. He has stable 1-2 pillow orthopnea. He was not discharged on a diuretic from [facility] given relative hypotension with associated orthostasis following diuresis.   He established with [facility] following his recent admission as above with a clinic weight of 121.1 kg. He remained off OAC given recent GI bleed with pending repeat EGD by GI. He was noted to be hypotensive with ABP of 94/60. He was started on Lasix 20 mg prn.   While at rehab, he continued to note worsening SOB with weight gain > 5 pounds overnight prompting him to be brought to [facility] for further evaluation. He was noted to be volume overloaded with an admission weight of 124.7 kg that has improved to 120.5 kg to date. He remains in Afib with reasonably controlled ventricular rates in the 90s bpm. Tropon in negative  X 2, BNP 146, SCr 0.91-->1.01, K+ 3.8-->3.0, WBC 6.3, HGB 9.6 (discharge HGB of 8.3 on 05/19/17), blood cultures negative x 2 to date. If was given IV Lasix and metolazone with a documented UOP of 4 L overnight. He has been rate controlled with IV Cardizem gtt.     Past Medical History:  Diagnosis Date  . Chronic atrial fibrillation (HCC)    a. CHADS2VASc 5 (CHF, HTN, age x 2, vascular disease)  . Chronic systolic CHF (congestive heart failure) (HCC)    a. TTE 1/19: EF 50%, mild LVH, trivial MR/PR/TR, small pericardial  effusion  . Chronic venous stasis   . GI bleed   . Hypertension     Past Surgical History:  Procedure Laterality Date  . ABDOMINAL SURGERY       Home Meds: Prior to Admission medications   Not on File    Inpatient Medications: Scheduled Meds: . aspirin EC  81 mg Oral Daily  . carvedilol  3.125 mg Oral BID WC  . docusate sodium  100 mg Oral BID  . enoxaparin (LOVENOX) injection  40  mg Subcutaneous Q24H  . furosemide  40 mg Intravenous BID  . lisinopril  2.5 mg Oral Daily  . metolazone  5 mg Oral Daily  . sodium chloride flush  3 mL Intravenous Q12H   Continuous Infusions: . sodium chloride    .  ceFAZolin (ANCEF) IV 2 g (06/05/17 0640)  . diltiazem (CARDIZEM) infusion 5 mg/hr (06/05/17 0412)   PRN Meds: sodium chloride, acetaminophen **OR** acetaminophen, bisacodyl, HYDROcodone-acetaminophen, ipratropium-albuterol, metoprolol tartrate, ondansetron **OR** ondansetron (ZOFRAN) IV, polyethylene glycol, sodium chloride flush  Allergies:   Allergies  Allergen Reactions  . Mucinex [Guaifenesin Er] Rash    Social History:   Social History   Socioeconomic History  . Marital status: Married    Spouse name: Not on file  . Number of children: Not on file  . Years of education: Not on file  . Highest education level: Not on file  Social Needs  . Financial resource strain: Not on file  . Food insecurity - worry: Not on file  . Food insecurity - inability: Not on file  . Transportation needs - medical: Not on file  . Transportation needs - non-medical: Not on file  Occupational History  . Not on file  Tobacco Use  . Smoking status: Never Smoker  . Smokeless tobacco: Never Used  Substance and Sexual Activity  . Alcohol use: Yes    Comment: none for 3 weeks  . Drug use: No  . Sexual activity: Not on file  Other Topics Concern  . Not on file  Social History Narrative  . Not on file     Family History:   Family History  Problem Relation Age of Onset  . Hypertension Father     ROS:  Review of Systems  Constitutional: Positive for malaise/fatigue. Negative for chills, diaphoresis, fever and weight loss.  HENT: Negative for congestion.   Eyes: Negative for discharge and redness.  Respiratory: Positive for cough, sputum production and shortness of breath. Negative for hemoptysis and wheezing.        Clear sputum  Cardiovascular: Positive for leg  swelling. Negative for chest pain, palpitations, orthopnea, claudication and PND.  Gastrointestinal: Negative for abdominal pain, blood in stool, heartburn, melena, nausea and vomiting.  Genitourinary: Negative for hematuria.  Musculoskeletal: Negative for falls and myalgias.  Skin: Negative for rash.  Neurological: Positive for weakness. Negative for dizziness, tingling, tremors, sensory change, speech change, focal weakness and loss of consciousness.  Endo/Heme/Allergies: Does not bruise/bleed easily.  Psychiatric/Behavioral: Negative for substance abuse. The patient is not nervous/anxious.   All other systems reviewed and are negative.     Physical Exam/Data:   Vitals:   06/04/17 1740 06/04/17 1916 06/05/17 0333 06/05/17 0736  BP: 114/68 (!) 106/54 104/61 111/71  Pulse: 96 87 90 87  Resp: 18 17 18 18   Temp:  97.9 F (36.6 C) 97.7 F (36.5 C) 98.1 F (36.7 C)  TempSrc:  Oral Oral   SpO2: 97% 98% 97%   Weight:  265 lb 9.6 oz (120.5 kg)   Height:        Intake/Output Summary (Last 24 hours) at 06/05/2017 0852 Last data filed at 06/05/2017 0412 Gross per 24 hour  Intake 160.67 ml  Output 4175 ml  Net -4014.33 ml   Filed Weights   06/04/17 1020 06/04/17 1441 06/05/17 0333  Weight: 275 lb (124.7 kg) 271 lb (122.9 kg) 265 lb 9.6 oz (120.5 kg)   Body mass index is 36.02 kg/m.   Physical Exam: General: Well developed, well nourished, in no acute distress. Head: Normocephalic, atraumatic, sclera non-icteric, no xanthomas, nares without discharge. Neck: Negative for carotid bruits. JVD mildly elevated. Lungs: Diminished breath sounds bilaterally. Breathing is unlabored. Heart: Irregularly irregular with S1 S2. No murmurs, rubs, or gallops appreciated. Abdomen: Soft, non-tender, non-distended with normoactive bowel sounds. No hepatomegaly. No rebound/guarding. No obvious abdominal masses. Msk:  Strength and tone appear normal for age. Extremities: Bilateral LE wrapped with ACE  bandage. Neuro: Alert and oriented X 3. No facial asymmetry. No focal deficit. Moves all extremities spontaneously. Psych:  Responds to questions appropriately with a normal affect.   EKG:  The EKG was personally reviewed and demonstrates: Afib with RVR, 103 bpm, left anterior fascicular block, low voltage precordial leads, poor R wave progression, baseline wandering Telemetry:  Telemetry was personally reviewed and demonstrates: Afib, 90s bpm  Weights: Filed Weights   06/04/17 1020 06/04/17 1441 06/05/17 0333  Weight: 275 lb (124.7 kg) 271 lb (122.9 kg) 265 lb 9.6 oz (120.5 kg)    Relevant CV Studies: TTE pending  ECHO 05/03/2017 INTERPRETATION --------------------------------------------------------------- MILD LV DYSFUNCTION (ef 50%) WITH MILD LVH NORMAL RIGHT VENTRICULAR SYSTOLIC FUNCTION VALVULAR REGURGITATION: TRIVIAL MR, TRIVIAL PR, TRIVIAL TR NO VALVULAR STENOSIS SMALL PERICARDIAL EFFUSION WITH BORDERLINE RESPIRATORY FLOW VARIATION AND ABNORMAL IVC. POOR SOUND TRANSMISSION Dias.FxClass: INDETERMINATE NO PRIOR STUDY FOR COMPARISON   Laboratory Data:  Chemistry Recent Labs  Lab 06/04/17 1035 06/05/17 0147  NA 140 137  K 3.8 3.0*  CL 106 100*  CO2 26 28  GLUCOSE 117* 103*  BUN 15 13  CREATININE 0.91 1.01  CALCIUM 8.9 8.7*  GFRNONAA >60 >60  GFRAA >60 >60  ANIONGAP 8 9    Recent Labs  Lab 06/04/17 1035  PROT 7.0  ALBUMIN 2.7*  AST 20  ALT 6*  ALKPHOS 57  BILITOT 0.8   Hematology Recent Labs  Lab 06/04/17 1035  WBC 6.3  RBC 3.30*  HGB 9.6*  HCT 29.9*  MCV 90.6  MCH 29.2  MCHC 32.2  RDW 15.0*  PLT 177   Cardiac Enzymes Recent Labs  Lab 06/04/17 1035 06/04/17 1636 06/04/17 1950 06/05/17 0147  TROPONINI <0.03 <0.03 <0.03 <0.03   No results for input(s): TROPIPOC in the last 168 hours.  BNP Recent Labs  Lab 06/04/17 1035  BNP 146.0*    DDimer No results for input(s): DDIMER in the last 168 hours.  Radiology/Studies:   Dg Chest 2 View  Result Date: 06/04/2017 IMPRESSION: Findings are most consistent with CHF. Right upper extremity PICC. Tip is at the cavoatrial junction. Round opacity projects over the lower lung zones. Follow-up chest radiographs are recommend Electronically Signed   By: Jolaine Click M.D.   On: 06/04/2017 11:09    Assessment and Plan:   1. Persistent Afib with RVR: -He remains in Afib with improved ventricular rates in the 90s bpm -Transition from IV Cardizem to PO short-acting for today with likely consolidation to long-acting on 2/26 -Not on OAC at  this time given recent GI bleed and ongoing anemia -Continue rate control with PO diltiazem as above and Coreg -Stop ASA -CHADS2VASc 5 (CHF, HTN, age x 2, vascular disease)  2. Acute on chronic diastolic CHF: -He remains volume overloaded -Continue IV Lasix -Stop daily metolazone  -Recent echo 04/2017 as above, no need to repeat at this time -CHF education -Daily weights, strict Is and Os  3. Chronic venous stasis: -Likely exacerbated by his low albumin and anemia -Gentle diuresis  -Compression stockings -Elevate legs  4. History of upper GI bleed with acute blood loss anemia: -Has not been on OAC as above -Continue to defer OAC at this time -Per IM  5. Hypokalemia: -Replete to goal > 4.0  6. Hyperglycemia: -Check A1c  7. Hypoalbuminemia: -Likely exacerbating his swelling -Per IM   8. Anemia: -Likely exacerbating his symptoms -Improved with from recent CBC on 05/19/17 with a HGB of 8.3 -Trend -Per IM  9. HTN: -Well controlled -Previously hypotensive following diuresis -May need to hold lisinopril   For questions or updates, please contact [facility] Please consult [email or web address] for contact info under Cardiology/STEMI.   Signed, Eula Listenyan Keeshia Sanderlin, PA-C [facility] Pager: [phone] 06/05/2017, 8:52 AM

## 2017-06-06 ENCOUNTER — Inpatient Hospital Stay: Payer: Medicare Other

## 2017-06-06 DIAGNOSIS — I4891 Unspecified atrial fibrillation: Secondary | ICD-10-CM

## 2017-06-06 DIAGNOSIS — I5033 Acute on chronic diastolic (congestive) heart failure: Secondary | ICD-10-CM

## 2017-06-06 LAB — CBC
HCT: 29.6 % — ABNORMAL LOW (ref 40.0–52.0)
Hemoglobin: 9.6 g/dL — ABNORMAL LOW (ref 13.0–18.0)
MCH: 29 pg (ref 26.0–34.0)
MCHC: 32.5 g/dL (ref 32.0–36.0)
MCV: 89.1 fL (ref 80.0–100.0)
PLATELETS: 209 10*3/uL (ref 150–440)
RBC: 3.32 MIL/uL — AB (ref 4.40–5.90)
RDW: 14.5 % (ref 11.5–14.5)
WBC: 5.6 10*3/uL (ref 3.8–10.6)

## 2017-06-06 LAB — PROTEIN, PLEURAL OR PERITONEAL FLUID: TOTAL PROTEIN, FLUID: 3 g/dL

## 2017-06-06 LAB — BASIC METABOLIC PANEL
ANION GAP: 10 (ref 5–15)
BUN: 11 mg/dL (ref 6–20)
CALCIUM: 8.9 mg/dL (ref 8.9–10.3)
CO2: 31 mmol/L (ref 22–32)
Chloride: 98 mmol/L — ABNORMAL LOW (ref 101–111)
Creatinine, Ser: 0.79 mg/dL (ref 0.61–1.24)
Glucose, Bld: 91 mg/dL (ref 65–99)
POTASSIUM: 3.1 mmol/L — AB (ref 3.5–5.1)
Sodium: 139 mmol/L (ref 135–145)

## 2017-06-06 LAB — GRAM STAIN

## 2017-06-06 LAB — BODY FLUID CELL COUNT WITH DIFFERENTIAL
EOS FL: 0 %
Lymphs, Fluid: 96 %
Monocyte-Macrophage-Serous Fluid: 3 %
Neutrophil Count, Fluid: 1 %
Total Nucleated Cell Count, Fluid: 170 cu mm

## 2017-06-06 LAB — ALBUMIN, PLEURAL OR PERITONEAL FLUID: ALBUMIN FL: 1.5 g/dL

## 2017-06-06 LAB — LACTATE DEHYDROGENASE, PLEURAL OR PERITONEAL FLUID: LD FL: 54 U/L — AB (ref 3–23)

## 2017-06-06 LAB — HEMOGLOBIN A1C
Hgb A1c MFr Bld: 5 % (ref 4.8–5.6)
Mean Plasma Glucose: 97 mg/dL

## 2017-06-06 LAB — GLUCOSE, PLEURAL OR PERITONEAL FLUID: GLUCOSE FL: 101 mg/dL

## 2017-06-06 LAB — PATHOLOGIST SMEAR REVIEW

## 2017-06-06 LAB — AMYLASE, PLEURAL OR PERITONEAL FLUID: AMYLASE FL: 43 U/L

## 2017-06-06 MED ORDER — POTASSIUM CHLORIDE CRYS ER 20 MEQ PO TBCR
40.0000 meq | EXTENDED_RELEASE_TABLET | Freq: Once | ORAL | Status: AC
  Administered 2017-06-06: 40 meq via ORAL
  Filled 2017-06-06: qty 2

## 2017-06-06 MED ORDER — DILTIAZEM HCL 30 MG PO TABS
30.0000 mg | ORAL_TABLET | Freq: Four times a day (QID) | ORAL | Status: DC
  Administered 2017-06-06 – 2017-06-08 (×5): 30 mg via ORAL
  Filled 2017-06-06 (×7): qty 1

## 2017-06-06 MED ORDER — PREMIER PROTEIN SHAKE
11.0000 [oz_av] | Freq: Two times a day (BID) | ORAL | Status: DC
  Administered 2017-06-06 – 2017-06-10 (×5): 11 [oz_av] via ORAL

## 2017-06-06 MED ORDER — ADULT MULTIVITAMIN W/MINERALS CH
1.0000 | ORAL_TABLET | Freq: Every day | ORAL | Status: DC
  Administered 2017-06-06 – 2017-06-10 (×5): 1 via ORAL
  Filled 2017-06-06 (×5): qty 1

## 2017-06-06 NOTE — Consult Note (Signed)
WOC Nurse wound follow up Wound type:Chronic venous stasis.  Inconsistent use of compression at home, per patient and wife.  Has been in ace wraps since yesterday.  Will apply Unna boots today for consistent compression.  Right great toe has traumatic removal of nail.  Cleansed with betadine and dry dressing applied.  Measurement:intact Wound bed:n/a Drainage (amount, consistency, odor) none Periwound:chronic skin changes Dressing procedure/placement/frequency:Cleanse bilateral legs with soap and water and pat dry.  Wrap with zinc layer from below toes to below knee.  Secure with self adherent coban.  Change weekly.  Cleanse right great toe with betadine swab and cover with dry dressing daily.  WOC team will follow.  Maple HudsonKaren Ollie Esty RN BSN [facility] Pager [phone]

## 2017-06-06 NOTE — Progress Notes (Signed)
PT Cancellation Note  Patient Details Name: Carlos IrishRichard Roach MRN: [id number] DOB: Feb 21, 1941   Cancelled Treatment:    Reason Eval/Treat Not Completed: Medical issues which prohibited therapy. Chart reviewed. Pt underwent thoracentesis today with a 10% pneumothorax noted following procedure. Per MD notes pt to be observed overnight. MD paged to discuss PT evaluation and therapy advised pt to hold until next day. Will attempt PT evaluation after repeat cxr in the AM and when cleared by MD.   Lynnea MaizesJason D Nassir Neidert PT, DPT   Leya Paige 06/06/2017, 5:18 PM

## 2017-06-06 NOTE — Care Management (Signed)
Found that [facility] was following patient at facility for home health at the time of discharge.  Patient had thoracentesis with removal of 1.2 liters of fluid. Found to have 10% left pneumothorax. Anticipate patient will discharge with unna boots.

## 2017-06-06 NOTE — Progress Notes (Signed)
[facility] rounding on unit. Patient was with medical staff member. [facility] provided silent prayer for patient.

## 2017-06-06 NOTE — Progress Notes (Signed)
Progress Note  Patient Name: Nobuo Nunziata Date of Encounter: 06/06/2017  Primary Cardiologist: Lidia Collum, MD - [facility])  Subjective   Breathing improved to though not at baseline.  Still coughing.  No chest pain.  Scheduled for left-sided thoracentesis today.  Inpatient Medications    Scheduled Meds: . benzonatate  200 mg Oral TID  . diltiazem  30 mg Oral Q8H  . docusate sodium  100 mg Oral BID  . furosemide  40 mg Intravenous BID  . metoprolol tartrate  25 mg Oral BID  . potassium chloride  40 mEq Oral BID  . sodium chloride flush  10-40 mL Intracatheter Q12H  . sodium chloride flush  3 mL Intravenous Q12H   Continuous Infusions: . sodium chloride    .  ceFAZolin (ANCEF) IV 2 g (06/06/17 1114)   PRN Meds: sodium chloride, acetaminophen **OR** acetaminophen, bisacodyl, HYDROcodone-acetaminophen, ipratropium-albuterol, metoprolol tartrate, ondansetron **OR** ondansetron (ZOFRAN) IV, polyethylene glycol, sodium chloride flush, sodium chloride flush   Vital Signs    Vitals:   06/06/17 0958 06/06/17 1010 06/06/17 1120 06/06/17 1120  BP: (!) 86/44 (!) 85/62 108/62 108/66  Pulse: 94 99 100 (!) 107  Resp:      Temp:      TempSrc:      SpO2: 95% (!) 89%    Weight:      Height:        Intake/Output Summary (Last 24 hours) at 06/06/2017 1131 Last data filed at 06/06/2017 0618 Gross per 24 hour  Intake 680 ml  Output 2000 ml  Net -1320 ml   Filed Weights   06/04/17 1441 06/05/17 0333 06/06/17 0316  Weight: 271 lb (122.9 kg) 265 lb 9.6 oz (120.5 kg) 264 lb 3.2 oz (119.8 kg)    Physical Exam   GEN: Well nourished, well developed, in no acute distress.  HEENT: Grossly normal.  Neck: Supple, no carotid bruits, or masses.  JVP approximately 12-14 cm. Cardiac: Irregularly irregular, no murmurs, rubs, or gallops. No clubbing, cyanosis, trace bilateral lower extremity edema.  Bilateral lower legs are wrapped.   Respiratory:  Respirations  regular and unlabored, diminished breath sounds in the left base. GI: Obese, protuberant, semifirm, nontender, nondistended, BS + x 4. MS: no deformity or atrophy. Skin: warm and dry, no rash. Neuro:  Strength and sensation are intact. Psych: AAOx3.  Normal affect.  Labs    Chemistry Recent Labs  Lab 06/04/17 1035 06/05/17 0147 06/06/17 0506  NA 140 137 139  K 3.8 3.0* 3.1*  CL 106 100* 98*  CO2 26 28 31   GLUCOSE 117* 103* 91  BUN 15 13 11   CREATININE 0.91 1.01 0.79  CALCIUM 8.9 8.7* 8.9  PROT 7.0  --   --   ALBUMIN 2.7*  --   --   AST 20  --   --   ALT 6*  --   --   ALKPHOS 57  --   --   BILITOT 0.8  --   --   GFRNONAA >60 >60 >60  GFRAA >60 >60 >60  ANIONGAP 8 9 10      Hematology Recent Labs  Lab 06/04/17 1035 06/06/17 0506  WBC 6.3 5.6  RBC 3.30* 3.32*  HGB 9.6* 9.6*  HCT 29.9* 29.6*  MCV 90.6 89.1  MCH 29.2 29.0  MCHC 32.2 32.5  RDW 15.0* 14.5  PLT 177 209    Cardiac Enzymes Recent Labs  Lab 06/04/17 1035 06/04/17 1636 06/04/17 1950 06/05/17 0147  TROPONINI <0.03 <0.03 <0.03 <0.03    BNP Recent Labs  Lab 06/04/17 1035  BNP 146.0*    Radiology    Dg Chest 1 View  Result Date: 06/06/2017 CLINICAL DATA:  Left thoracentesis. EXAM: CHEST 1 VIEW COMPARISON:  06/04/2017 FINDINGS: Small left pleural effusion. Small left apical pneumothorax measuring less than 10%. Mild bilateral interstitial prominence. No focal consolidation. Stable cardiomediastinal silhouette. No acute osseous abnormality. Left shoulder arthroplasty. IMPRESSION: 1. Small left apical pneumothorax status post thoracentesis. Pneumothorax measures less than 10%. 2. Small persistent left pleural effusion. These results were called by telephone at the time of interpretation on 06/06/2017 at 10:44 am to Dr. Richarda OverlieADAM HENN, who verbally acknowledged these results. Electronically Signed   By: Elige KoHetal  Patel   On: 06/06/2017 10:45   Ct Chest Wo Contrast  Result Date: 06/05/2017 CLINICAL DATA:   Evaluate opacity identified on recent chest radiograph. EXAM: CT CHEST WITHOUT CONTRAST TECHNIQUE: Multidetector CT imaging of the chest was performed following the standard protocol without IV contrast. COMPARISON:  Chest radiograph 06/04/2017 FINDINGS: Cardiovascular: The heart size is enlarged. Trace pericardial fluid. Aortic atherosclerosis. Calcification in the LAD, left circumflex and RCA coronary arteries noted. Mediastinum/Nodes: Normal appearance of the thyroid gland. Normal appearance of the esophagus. Prominent mediastinal lymph nodes are identified without adenopathy. Index right paratracheal node measures 8 mm, image 67/2. Evaluation of the hilar regions limited due to lack of intravenous contrast material. Lungs/Pleura: There is a moderate left pleural effusion. Complete atelectasis and/or consolidation of the left lower lobe is identified. Small right pleural effusion identified. No interstitial edema identified. In the right upper lobe there is a small pulmonary nodule which measures 4 mm, image 58/3. Upper Abdomen: No acute abnormality. 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 Mild upper T-spine scoliosis noted. No suspicious bone lesions. IMPRESSION: 1. Moderate left pleural effusion with complete atelectasis and/or consolidation of the left lower lobe. Follow-up imaging is advised to ensure resolution of left lower lobe atelectasis/consolidation and rule out underlying malignancy. 2. Small right pleural effusion. 3. 4 mm right upper lobe pulmonary nodule. No follow-up needed if patient is low-risk. Non-contrast chest CT can be considered in 12 months if patient is high-risk. This recommendation follows the consensus statement: Guidelines for Management of Incidental Pulmonary Nodules Detected on CT Images: From the Fleischner Society 2017; Radiology 2017; 284:228-243. 4. Cardiac enlargement, aortic atherosclerosis and 3 vessel coronary artery atherosclerotic  calcifications. Aortic Atherosclerosis (ICD10-I70.0). Electronically Signed   By: Signa Kellaylor  Stroud M.D.   On: 06/05/2017 13:46    Telemetry    Atrial fibrillation-90s to low 100s- Personally Reviewed  Cardiac Studies   January 2019-echocardiogram ([facility])  EF 50%, mild LVH, trivial MR/PR/TR, small pericardial effusion. _____________   Patient Profile     77 y.o. male with a history of permanent atrial fibrillation, chronic combined CHF, recent GI bleed and acute blood loss anemia requiring packed red blood cells in the setting of duodenal mass and oral anticoagulation (Eliquis currently on hold), hypokalemia, and alcohol abuse, who was admitted February 24 with cough, dyspnea, volume overload, and left pleural effusion.  Assessment & Plan    1.  Acute on chronic diastolic congestive heart failure: Patient with recent echo showing EF of 50%.  [facility] with [facility].  He has been at rehab recently.  In that setting, he has been developing increasing lower extremity swelling and dyspnea, prompting presentation on February 24.  Patient volume overloaded  on admission and also noted to have moderate sized left pleural effusion.  He was -2.2 L overnight and 6.2 L since admission.  Weight is down 7 pounds since admission.  He continues to have significant volume overload.  Previous d/c weight on 05/19/17 was 258 lbs (currently 264 lbs).  Renal function is stable.  Continue IV Lasix at 40 mg twice daily.  Heart rate is reasonable and blood pressure stable to soft.  2.  Left pleural effusion: Thoracentesis performed this morning with 1.2 L removed.  Post procedure chest x-ray with 10% pneumothorax.  Interventional radiology following with plan for repeat x-ray later today.  3.  Permanent atrial fibrillation: Rapid ventricular response in the setting of respiratory distress and volume overload.  Rates more reasonably controlled currently.  Continue  beta-blocker and calcium channel blocker therapy.  Blood pressures have been soft.  We will see if he can tolerate diltiazem every 6 hours instead of every 8, to allow for better 24-hour coverage.  Previously on Eliquis however this, this is on hold in the setting of recent GI bleed.  4.  Recent GI bleed/duodenal mass: Eliquis on hold.  H&H stable. Has outpt GI f/u with plan for repeat EGD.  5.  Hypokalemia: Supplement.  6.  Chronic venous stasis with lower extremity cellulitis: Legs are wrapped.  Antibiotics per internal medicine.  7.  Essential hypertension: Blood pressure soft.  Home dose of ACE inhibitor on hold.  Signed, Nicolasa Ducking, NP  06/06/2017, 11:31 AM    For questions or updates, please contact   Please consult [email or web address] for contact info under Cardiology/STEMI.

## 2017-06-06 NOTE — Progress Notes (Signed)
[employer] [facility] [place] [facility]                                                                                                                                                                                  Patient Demographics   Carlos Roach, is a 77 y.o. male, DOB - [birth year], ZOX:[id number]  Admit date - 06/04/2017   Admitting Physician Carlos SolMontell D Salary, Roach  Outpatient Primary Roach for the patient is Carlos Roach, Carlos Roach   LOS - 2  Subjective: Patient continues to have cough and congestion Breathing much improved   Review of Systems:   CONSTITUTIONAL: No documented fever. No fatigue, weakness. No weight gain, no weight loss.  EYES: No blurry or double vision.  ENT: No tinnitus. No postnasal drip. No redness of the oropharynx.  RESPIRATORY: Positive cough, no wheeze, no hemoptysis.  Positive dyspnea.  CARDIOVASCULAR: No chest pain. No orthopnea. No palpitations. No syncope.  GASTROINTESTINAL: No nausea, no vomiting or diarrhea. No abdominal pain. No melena or hematochezia.  GENITOURINARY: No dysuria or hematuria.  ENDOCRINE: No polyuria or nocturia. No heat or cold intolerance.  HEMATOLOGY: No anemia. No bruising. No bleeding.  INTEGUMENTARY: No rashes. No lesions.  MUSCULOSKELETAL: No arthritis. No swelling. No gout.  NEUROLOGIC: No numbness, tingling, or ataxia. No seizure-type activity.  PSYCHIATRIC: No anxiety. No insomnia. No ADD.    Vitals:   Vitals:   06/06/17 1120 06/06/17 1324 06/06/17 1325 06/06/17 1331  BP: 108/66 111/74 111/74   Pulse: (!) 107  (!) 112   Resp:      Temp:      TempSrc:      SpO2:   95%   Weight:    250 lb 14.4 oz (113.8 kg)  Height:        Wt Readings from Last 3 Encounters:  06/06/17 250 lb 14.4 oz (113.8 kg)     Intake/Output Summary (Last 24 hours) at 06/06/2017 1518 Last data filed at 06/06/2017 1448 Gross per 24 hour  Intake 440 ml  Output 2445 ml  Net -2005 ml    Physical Exam:   GENERAL:  Pleasant-appearing in no apparent distress.  HEAD, EYES, EARS, NOSE AND THROAT: Atraumatic, normocephalic. Extraocular muscles are intact. Pupils equal and reactive to light. Sclerae anicteric. No conjunctival injection. No oro-pharyngeal erythema.  NECK: Supple. There is no jugular venous distention. No bruits, no lymphadenopathy, no thyromegaly.  HEART: Regular rate and rhythm,. No murmurs, no rubs, no clicks.  LUNGS: Decreased breath sounds without any accessory muscle usage ABDOMEN: Soft, flat, nontender, nondistended. Has good bowel sounds. No hepatosplenomegaly appreciated.  EXTREMITIES: No evidence of any cyanosis, clubbing, or peripheral edema.  +  2 pedal and radial pulses bilaterally.  NEUROLOGIC: The patient is alert, awake, and oriented x3 with no focal motor or sensory deficits appreciated bilaterally.  SKIN: Moist and warm with no rashes appreciated.  Psych: Not anxious, depressed LN: No inguinal LN enlargement    Antibiotics   Anti-infectives (From admission, onward)   Start     Dose/Rate Route Frequency Ordered Stop   06/04/17 1600  ceFAZolin (ANCEF) IVPB 2g/100 mL premix     2 g 200 mL/hr over 30 Minutes Intravenous Every 6 hours 06/04/17 1357        Medications   Scheduled Meds: . benzonatate  200 mg Oral TID  . diltiazem  30 mg Oral Q6H  . docusate sodium  100 mg Oral BID  . furosemide  40 mg Intravenous BID  . metoprolol tartrate  25 mg Oral BID  . multivitamin with minerals  1 tablet Oral Daily  . potassium chloride  40 mEq Oral BID  . protein supplement shake  11 oz Oral BID BM  . sodium chloride flush  10-40 mL Intracatheter Q12H  . sodium chloride flush  3 mL Intravenous Q12H   Continuous Infusions: . sodium chloride    .  ceFAZolin (ANCEF) IV Stopped (06/06/17 1145)   PRN Meds:.sodium chloride, acetaminophen **OR** acetaminophen, bisacodyl, HYDROcodone-acetaminophen, ipratropium-albuterol, metoprolol tartrate, ondansetron **OR** ondansetron (ZOFRAN) IV,  polyethylene glycol, sodium chloride flush, sodium chloride flush   Data Review:   Micro Results Recent Results (from the past 240 hour(s))  CULTURE, BLOOD (ROUTINE X 2) w Reflex to ID Panel     Status: None (Preliminary result)   Collection Time: 06/04/17  4:36 PM  Result Value Ref Range Status   Specimen Description BLOOD BLOOD LEFT ARM  Final   Special Requests   Final    BOTTLES DRAWN AEROBIC AND ANAEROBIC Blood Culture adequate volume   Culture   Final    NO GROWTH 2 DAYS Performed at [facility], [address], [place], [place] [address]    Report Status PENDING  Incomplete  CULTURE, BLOOD (ROUTINE X 2) w Reflex to ID Panel     Status: None (Preliminary result)   Collection Time: 06/04/17  4:36 PM  Result Value Ref Range Status   Specimen Description BLOOD LEFT ANTECUBITAL  Final   Special Requests   Final    BOTTLES DRAWN AEROBIC AND ANAEROBIC Blood Culture adequate volume   Culture   Final    NO GROWTH 2 DAYS Performed at [facility], [address], [place], [place] [address]    Report Status PENDING  Incomplete  MRSA PCR Screening     Status: None   Collection Time: 06/05/17  6:42 PM  Result Value Ref Range Status   MRSA by PCR NEGATIVE NEGATIVE Final    Comment:        The GeneXpert MRSA Assay (FDA approved for NASAL specimens only), is one component of a comprehensive MRSA colonization surveillance program. It is not intended to diagnose MRSA infection nor to guide or monitor treatment for MRSA infections. Performed at [facility], [address] [place], [place] [address]   Gram stain     Status: None   Collection Time: 06/06/17 10:15 AM  Result Value Ref Range Status   Specimen Description PLEURAL  Final   Special Requests NONE  Final   Gram Stain   Final    FEW WBC NO ORGANISMS SEEN Performed at [facility], [address] [place],  [place] [address]    Report Status 06/06/2017 FINAL  Final     Radiology Reports Dg Chest 1 View  Result Date: 06/06/2017 CLINICAL DATA:  Follow-up pneumothorax. EXAM: CHEST 1 VIEW COMPARISON:  Chest x-ray 06/06/2017.  CT chest 06/05/2017. FINDINGS: PICC line noted with tip over superior vena cava. Cardiomegaly with mild bilateral interstitial prominence and small bilateral pleural effusions. Findings suggest mild CHF. Stable small left apical pneumothorax. Postsurgical changes left shoulder. IMPRESSION: 1.  Stable small left apical pneumothorax. 2.  Right PICC line noted with tip over superior vena cava. 3. Cardiomegaly with mild bilateral interstitial prominence and small bilateral pleural effusions. Mild CHF could present in this fashion. Electronically Signed   By: Maisie Fus  Register   On: 06/06/2017 14:18   Dg Chest 1 View  Result Date: 06/06/2017 CLINICAL DATA:  Left thoracentesis. EXAM: CHEST 1 VIEW COMPARISON:  06/04/2017 FINDINGS: Small left pleural effusion. Small left apical pneumothorax measuring less than 10%. Mild bilateral interstitial prominence. No focal consolidation. Stable cardiomediastinal silhouette. No acute osseous abnormality. Left shoulder arthroplasty. IMPRESSION: 1. Small left apical pneumothorax status post thoracentesis. Pneumothorax measures less than 10%. 2. Small persistent left pleural effusion. These results were called by telephone at the time of interpretation on 06/06/2017 at 10:44 am to Dr. Richarda Overlie, who verbally acknowledged these results. Electronically Signed   By: Elige Ko   On: 06/06/2017 10:45   Dg Chest 2 View  Result Date: 06/04/2017 CLINICAL DATA:  Nonproductive cough bilateral lower extremity swelling for 2 weeks. EXAM: CHEST  2 VIEW COMPARISON:  None. FINDINGS: Right upper extremity PIC C. tip is at the cavoatrial junction. Moderate cardiomegaly. Vascular congestion. Central and basilar pulmonary edema. Left pleural effusion. Round opacity projects inferior to the hilum on the lateral view of unknown  significance. IMPRESSION: Findings are most consistent with CHF. Right upper extremity PICC. Tip is at the cavoatrial junction. Round opacity projects over the lower lung zones. Follow-up chest radiographs are recommend Electronically Signed   By: Jolaine Click M.D.   On: 06/04/2017 11:09   Ct Chest Wo Contrast  Result Date: 06/05/2017 CLINICAL DATA:  Evaluate opacity identified on recent chest radiograph. EXAM: CT CHEST WITHOUT CONTRAST TECHNIQUE: Multidetector CT imaging of the chest was performed following the standard protocol without IV contrast. COMPARISON:  Chest radiograph 06/04/2017 FINDINGS: Cardiovascular: The heart size is enlarged. Trace pericardial fluid. Aortic atherosclerosis. Calcification in the LAD, left circumflex and RCA coronary arteries noted. Mediastinum/Nodes: Normal appearance of the thyroid gland. Normal appearance of the esophagus. Prominent mediastinal lymph nodes are identified without adenopathy. Index right paratracheal node measures 8 mm, image 67/2. Evaluation of the hilar regions limited due to lack of intravenous contrast material. Lungs/Pleura: There is a moderate left pleural effusion. Complete atelectasis and/or consolidation of the left lower lobe is identified. Small right pleural effusion identified. No interstitial edema identified. In the right upper lobe there is a small pulmonary nodule which measures 4 mm, image 58/3. Upper Abdomen: No acute abnormality. 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 Mild upper T-spine scoliosis noted. No suspicious bone lesions. IMPRESSION: 1. Moderate left pleural effusion with complete atelectasis and/or consolidation of the left lower lobe. Follow-up imaging is advised to ensure resolution of left lower lobe atelectasis/consolidation and rule out underlying malignancy. 2. Small right pleural effusion. 3. 4 mm right upper lobe pulmonary nodule. No follow-up needed if patient is low-risk.  Non-contrast chest CT can be considered in 12 months if patient is high-risk. This recommendation  follows the consensus statement: Guidelines for Management of Incidental Pulmonary Nodules Detected on CT Images: From the Fleischner Society 2017; Radiology 2017; 284:228-243. 4. Cardiac enlargement, aortic atherosclerosis and 3 vessel coronary artery atherosclerotic calcifications. Aortic Atherosclerosis (ICD10-I70.0). Electronically Signed   By: Signa Kell M.D.   On: 06/05/2017 13:46   US Thoracentesis Asp Pleural Space W/img Guide  Result Date: 06/06/2017 INDICATION: Shortness of breath. Pneumonia. Left-sided pleural effusion. Request diagnostic and therapeutic thoracentesis. EXAM: ULTRASOUND GUIDED LEFT THORACENTESIS MEDICATIONS: NoNonene. COMPLICATIONS: None immediate. Postprocedural chest x-ray demonstrates small apical pneumothorax, likely secondary to incomplete re-expansion of the lung. PROCEDURE: An ultrasound guided thoracentesis was thoroughly discussed with the patient and questions answered. The benefits, risks, alternatives and complications were also discussed. The patient understands and wishes to proceed with the procedure. Written consent was obtained. Ultrasound was performed to localize and mark an adequate pocket of fluid in the left chest. The area was then prepped and draped in the normal sterile fashion. 1% Lidocaine was used for local anesthesia. Under ultrasound guidance a Safe-T-Centesis catheter was introduced. Thoracentesis was performed. The catheter was removed and a dressing applied. FINDINGS: A total of approximately 1.2 L of clear yellow fluid was removed. Samples were sent to the laboratory as requested by the clinical team. IMPRESSION: Successful ultrasound guided left thoracentesis yielding 1.2 L of pleural fluid. Read by: Brayton El PA-C Electronically Signed   By: Richarda Overlie M.D.   On: 06/06/2017 10:52     CBC Recent Labs  Lab 06/04/17 1035 06/06/17 0506  WBC  6.3 5.6  HGB 9.6* 9.6*  HCT 29.9* 29.6*  PLT 177 209  MCV 90.6 89.1  MCH 29.2 29.0  MCHC 32.2 32.5  RDW 15.0* 14.5  LYMPHSABS 0.9*  --   MONOABS 0.6  --   EOSABS 0.2  --   BASOSABS 0.1  --     Chemistries  Recent Labs  Lab 06/04/17 1035 06/05/17 0147 06/06/17 0506  NA 140 137 139  K 3.8 3.0* 3.1*  CL 106 100* 98*  CO2 26 28 31   GLUCOSE 117* 103* 91  BUN 15 13 11   CREATININE 0.91 1.01 0.79  CALCIUM 8.9 8.7* 8.9  MG  --  1.6*  --   AST 20  --   --   ALT 6*  --   --   ALKPHOS 57  --   --   BILITOT 0.8  --   --    ------------------------------------------------------------------------------------------------------------------ estimated creatinine clearance is 103.9 mL/min (by C-G formula based on SCr of 0.79 mg/dL). ------------------------------------------------------------------------------------------------------------------ Recent Labs    06/05/17 0147  HGBA1C 5.0   ------------------------------------------------------------------------------------------------------------------ Recent Labs    06/05/17 0147  CHOL 86  HDL 34*  LDLCALC 41  TRIG 56  CHOLHDL 2.5   ------------------------------------------------------------------------------------------------------------------ No results for input(s): TSH, T4TOTAL, T3FREE, THYROIDAB in the last 72 hours.  Invalid input(s): FREET3 ------------------------------------------------------------------------------------------------------------------ No results for input(s): VITAMINB12, FOLATE, FERRITIN, TIBC, IRON, RETICCTPCT in the last 72 hours.  Coagulation profile No results for input(s): INR, PROTIME in the last 168 hours.  No results for input(s): DDIMER in the last 72 hours.  Cardiac Enzymes Recent Labs  Lab 06/04/17 1636 06/04/17 1950 06/05/17 0147  TROPONINI <0.03 <0.03 <0.03    ------------------------------------------------------------------------------------------------------------------ Invalid input(s): POCBNP    Assessment & Plan   Patient 77 year old with complicated medical history presenting with shortness of breath   1.  Acute on chronic systolic CHF recent echo showed EF of 50% which was done within the past  month Continue therapy with IV Lasix patient responding to that diuresing well  2.  Possible pneumonia, patient had CT scan yesterday which showed moderate pleural effusion therefore I ordered a thoracentesis now has a small pneumothorax will need to monitor overnight Repeat chest x-ray in the morning  3.  A. fib with RVR continue oral Cardizem, continue metoprolol heart rate improved Due to recent massive GI bleed not a candidate for any type anticoagulation including aspirin  3.  Recent GI bleeding with a mass noted he needs to follow-up with [facility] for an endoscopy for definite diagnosis, Protonix twice daily  4.  Bilateral lower extremity venous stasis wound care consult Appreciated Unna boot placed  5.  Hypokalemia hypomagnesemia replaced  6.  Miscellaneous SCDs for DVT prophylaxis     Code Status Orders  (From admission, onward)        Start     Ordered   06/04/17 1358  Full code  Continuous     06/04/17 1357    Code Status History    Date Active Date Inactive Code Status Order ID Comments User Context   This patient has a current code status but no historical code status.    Advance Directive Documentation     Most Recent Value  Type of Advance Directive  Living will  Pre-existing out of facility DNR order (yellow form or pink MOST form)  No data  "MOST" Form in Place?  No data           Consults cardiology  DVT Prophylaxis scd's  Lab Results  Component Value Date   PLT 209 06/06/2017     Time Spent in minutes   Greater than 50% of time spent in care coordination and counseling patient  regarding the condition and plan of care.   Auburn Bilberry M.D on 06/06/2017 at 3:18 PM  Between 7am to 6pm - Pager - [phone]  After 6pm go to [email or web address] - password [email or web address] [facility]  [facility]   Office  [phone]

## 2017-06-06 NOTE — Progress Notes (Addendum)
Rounded on patient.  Patient in x-ray.  Wife at bedside.  This RN asked Mrs. Carlos Roach if she had any questions about Heart Failure packet which was reviewed with Mr. Carlos Roach yesterday.  Note:  Mrs. Carlos Roach was not in the room yesterday when this nurse completed HF education with patient.  Mrs. Carlos Roach stated she and her husband have reviewed the HF packet and plan to purchase scales so patient can weigh himself.    [facility] - Explained the purpose of the HF Clinic. ?Explained to wife the HF Clinic does not replace PCP nor Cardiologist, but is an additional resource to helping patient manage heart failure at home.   Mrs. Carlos Roach is in agreement for patient to be followed up in [facility].  Patient has new patient appointment on June 15, 2017 at 10:20 a.m. [facility] Brochure given to Mrs. Carlos Roach.   Mrs. Carlos Roach thanked me for providing the above information.    Army Meliaiane Wright, RN, BSN, [facility] Cardiovascular and Pulmonary Nurse Navigator

## 2017-06-06 NOTE — Progress Notes (Signed)
Nutrition Education Note  RD consulted for nutrition education regarding new onset CHF.  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 Reviewed patient's dietary recall. Provided examples on ways to decrease sodium intake in diet. Discouraged intake of processed foods and use of salt shaker. Encouraged fresh fruits and vegetables as well as whole grain sources of carbohydrates to maximize fiber intake.   RD discussed why it is important for patient to adhere to diet recommendations, and emphasized the role of fluids, foods to avoid, and importance of weighing self daily. Teach back method used.  Expect fair compliance.  Body mass index is 34.03 kg/m. Pt meets criteria for obesity based on current BMI.  Current diet order is 2gram Na, patient is consuming approximately 50-100%% of meals at this time. Labs and medications reviewed.   RD will add Premier Protein and MVI to help pt meet his estimated needs.   No further nutrition interventions warranted at this time. RD contact information provided. If additional nutrition issues arise, please re-consult RD.   Carlos Holidayasey Jerzie Bieri MS, RD, LDN Pager #- [phone] After Hours Pager: [phone]

## 2017-06-06 NOTE — Progress Notes (Signed)
Held IV lasix and Po card d/t BP, MD notified.

## 2017-06-06 NOTE — Plan of Care (Signed)
Patient is diuresing. Patient is SOB with exertion.

## 2017-06-06 NOTE — Procedures (Signed)
PROCEDURE SUMMARY:  Successful US guided left thoracentesis. Yielded 1.2 L of clear yellow fluid. Pt tolerated procedure well. No immediate complications.  Specimen was sent for labs. CXR ordered.  Brayton ElBRUNING, Itzae Mccurdy PA-C 06/06/2017 10:12 AM

## 2017-06-06 NOTE — Progress Notes (Signed)
Post procedure CXR finds small 10% left PTX. This is likely due to incomplete reexpansion of the lung. Monitor for symptoms or O2 desat. Will check CXR later this afternoon or sooner if symptomatic.  Brayton ElKevin Bronnie Vasseur PA-C Interventional Radiology 06/06/2017 10:55 AM

## 2017-06-07 DIAGNOSIS — R609 Edema, unspecified: Secondary | ICD-10-CM

## 2017-06-07 DIAGNOSIS — R0603 Acute respiratory distress: Secondary | ICD-10-CM

## 2017-06-07 DIAGNOSIS — J90 Pleural effusion, not elsewhere classified: Secondary | ICD-10-CM

## 2017-06-07 MED ORDER — MAGNESIUM SULFATE 4 GM/100ML IV SOLN
4.0000 g | Freq: Once | INTRAVENOUS | Status: AC
  Administered 2017-06-07: 4 g via INTRAVENOUS
  Filled 2017-06-07: qty 100

## 2017-06-07 MED ORDER — FUROSEMIDE 10 MG/ML IJ SOLN
40.0000 mg | Freq: Three times a day (TID) | INTRAMUSCULAR | Status: DC
  Administered 2017-06-07 – 2017-06-09 (×6): 40 mg via INTRAVENOUS
  Filled 2017-06-07 (×6): qty 4

## 2017-06-07 MED ORDER — CEPHALEXIN 500 MG PO CAPS
500.0000 mg | ORAL_CAPSULE | Freq: Four times a day (QID) | ORAL | Status: DC
  Administered 2017-06-07 – 2017-06-10 (×12): 500 mg via ORAL
  Filled 2017-06-07 (×12): qty 1

## 2017-06-07 NOTE — Progress Notes (Addendum)
Physical Therapy Evaluation Patient Details Name: Carlos Roach MRN: [id number] DOB: 01-30-1941 Today's Date: 06/07/2017   History of Present Illness  Carlos IrishRichard Leist  is a 77 y.o. male recently [facility] (per [facility] report), presenting via EMS for 3-week history of progressive cough, worsening lower extremity swelling, has gained up to 5 pounds of fluid per day. In the emergency room patient was found to be tachycardic, tachypneic, BNP 146, EKG noted for A. fib with RVR, chest x-ray noted for heart failure. Patient evaluated emergency room, wife at the bedside, clinical exam noted for marked pitting edema up to the thigh bilaterally, avulsed right great toenail with associated mild infection, bilateral lower extremity cellulitis. Patient is now admitted for acute on chronic congestive heart failure exacerbation, possible PNA s/p thoracentesis with 10% pneumothorax after procedure, bilateral lower extremity cellulitis, A. fib with RVR, and avulsed right great toenail with infection. Pt would like to return home at discharge.  Clinical Impression  Pt admitted with above diagnosis. PT spoke with Dr. Vanita PandaSanaini who cleared pt to work with PT. Pt currently with functional limitations due to the deficits listed below (see PT Problem List). Pt requires CGA for bed mobility, transfers, and ambulation. He demonstrates safe hand placement with transfers. Good safety/stability in standing with UE support on walker. Mild increase in time required to come to standing. Pt able to complete a full lap around RN station with rolling walker. SaO2 around 93% on room air and HR fluctuates between 105-120 bpm. Pt denies DOE and no visible signs of respiratory distress. Gait speed is functional for limited community and full household ambulation. [facility] and family [facility] at discharge. Pt will benefit from PT services to address deficits in strength, balance, and mobility  in order to return to full function at home.       Follow Up Recommendations Home health PT;Supervision - Intermittent    Equipment Recommendations  Rolling walker with 5" wheels;Other (comment)(Pt needs rolling walker or wheelkit for his standard walker)    Recommendations for Other Services       Precautions / Restrictions Precautions Precautions: Fall Restrictions Weight Bearing Restrictions: No      Mobility  Bed Mobility Overal bed mobility: Needs Assistance Bed Mobility: Supine to Sit     Supine to sit: Min guard     General bed mobility comments: Provided cues for proper seuqencing. HOB elevated and use of bed rails. Increased time to complete  Transfers Overall transfer level: Needs assistance Equipment used: Rolling walker (2 wheeled) Transfers: Sit to/from Stand Sit to Stand: Min guard         General transfer comment: Pt demonstrates safe hand placement. Good safety/stability in standing with UE support on walker. Mild increase in time required to come to standing  Ambulation/Gait Ambulation/Gait assistance: Min guard Ambulation Distance (Feet): 220 Feet Assistive device: Rolling walker (2 wheeled)       General Gait Details: Pt able to complete a full lap around RN station with rolling walker. SaO2 around 93% on room air and HR fluctuates between 105-120 bpm. Pt denies DOE and no visible signs of respiratory distress. Gait speed is functional for limited community and full household ambulation  Stairs            Wheelchair Mobility    Modified Rankin (Stroke Patients Only)       Balance Overall balance assessment: Needs assistance Sitting-balance support: No upper extremity supported Sitting  balance-Leahy Scale: Normal     Standing balance support: No upper extremity supported Standing balance-Leahy Scale: Fair Standing balance comment: Able to maintain balance with feet apart and together without UE support. Positive Romberg                              Pertinent Vitals/Pain Pain Assessment: No/denies pain    Home Living Family/patient expects to be discharged to:: Private residence Living Arrangements: Spouse/significant other Available Help at Discharge: Family Type of Home: Apartment Home Access: Level entry     Home Layout: One level Home Equipment: Walker - standard;Bedside commode;Grab bars - tub/shower(no shower chair, no home O2)      Prior Function Level of Independence: Needs assistance   Gait / Transfers Assistance Needed: Has been ambulating with rolling walker since becoming ill. Prior to stay at [facility] and rehab placement pt was fully independent. Pt has had one fall in the last 12 months  ADL's / Homemaking Assistance Needed: Recently needing assist with ADLs/IADLs        Hand Dominance   Dominant Hand: Right    Extremity/Trunk Assessment   Upper Extremity Assessment Upper Extremity Assessment: LUE deficits/detail LUE Deficits / Details: Pt reports L shoulder "reconstruction" with chronic deficits. He does present with notable L shoulder flexion weakness. Otherwise bilateral UE strength grossly WFL    Lower Extremity Assessment Lower Extremity Assessment: Generalized weakness       Communication   Communication: No difficulties  Cognition Arousal/Alertness: Awake/alert Behavior During Therapy: WFL for tasks assessed/performed Overall Cognitive Status: Within Functional Limits for tasks assessed                                        General Comments      Exercises     Assessment/Plan    PT Assessment Patient needs continued PT services  PT Problem List Decreased strength;Decreased activity tolerance;Decreased balance;Decreased mobility;Cardiopulmonary status limiting activity       PT Treatment Interventions DME instruction;Gait training;Functional mobility training;Therapeutic activities;Therapeutic exercise;Balance training;Neuromuscular  re-education;Patient/family education    PT Goals (Current goals can be found in the Care Plan section)  Acute Rehab PT Goals Patient Stated Goal: Return to prior level of function at home PT Goal Formulation: With patient Time For Goal Achievement: 06/21/17 Potential to Achieve Goals: Good    Frequency Min 2X/week   Barriers to discharge        Co-evaluation               AM-PAC PT "6 Clicks" Daily Activity  Outcome Measure Difficulty turning over in bed (including adjusting bedclothes, sheets and blankets)?: None Difficulty moving from lying on back to sitting on the side of the bed? : A Little Difficulty sitting down on and standing up from a chair with arms (e.g., wheelchair, bedside commode, etc,.)?: A Little Help needed moving to and from a bed to chair (including a wheelchair)?: A Little Help needed walking in hospital room?: A Little Help needed climbing 3-5 steps with a railing? : A Little 6 Click Score: 19    End of Session Equipment Utilized During Treatment: Gait belt Activity Tolerance: Patient tolerated treatment well Patient left: in bed;with call bell/phone within reach;with bed alarm set   PT Visit Diagnosis: Unsteadiness on feet (R26.81);Muscle weakness (generalized) (M62.81);Difficulty in walking, not elsewhere classified (R26.2)  Time: 1610-9604 PT Time Calculation (min) (ACUTE ONLY): 22 min   Charges:   PT Evaluation $PT Eval Low Complexity: 1 Low PT Treatments $Gait Training: 8-22 mins   PT G Codes:        Sharalyn Ink Darryon Bastin PT, DPT    Mohsen Odenthal 06/07/2017, 2:01 PM

## 2017-06-07 NOTE — Care Management (Signed)
Physical therapy has recommended functionally patient can return home with home health services

## 2017-06-07 NOTE — Progress Notes (Addendum)
So the but I got from 101 and 1 1, now  Progress Note  Patient Name: Carlos Roach Date of Encounter: 06/07/2017  Primary Cardiologist: Lidia Collum, MD - [facility])    Subjective   Mild improvement in shortness of breath after thoracentesis yesterday Heavy cough, no significant sputum Legs and wraps, edema in the thighs Abdominal bloating, 67% effective this year which is better than it was last year July 20 Has not been ambulating  Inpatient Medications    Scheduled Meds: . benzonatate  200 mg Oral TID  . diltiazem  30 mg Oral Q6H  . docusate sodium  100 mg Oral BID  . furosemide  40 mg Intravenous TID  . metoprolol tartrate  25 mg Oral BID  . multivitamin with minerals  1 tablet Oral Daily  . potassium chloride  40 mEq Oral BID  . protein supplement shake  11 oz Oral BID BM  . sodium chloride flush  10-40 mL Intracatheter Q12H  . sodium chloride flush  3 mL Intravenous Q12H   Continuous Infusions: . sodium chloride    .  ceFAZolin (ANCEF) IV Stopped (06/07/17 0704)   PRN Meds: sodium chloride, acetaminophen **OR** acetaminophen, bisacodyl, HYDROcodone-acetaminophen, ipratropium-albuterol, metoprolol tartrate, ondansetron **OR** ondansetron (ZOFRAN) IV, polyethylene glycol, sodium chloride flush, sodium chloride flush   Vital Signs    Vitals:   06/06/17 2159 06/07/17 0031 06/07/17 0332 06/07/17 0626  BP: 112/70 93/63 111/69 104/68  Pulse: 99 (!) 107 99 (!) 106  Resp:   17   Temp:   98.8 F (37.1 C)   TempSrc:   Oral   SpO2:   93%   Weight:   248 lb 11.2 oz (112.8 kg)   Height:        Intake/Output Summary (Last 24 hours) at 06/07/2017 0903 Last data filed at 06/07/2017 0300 Gross per 24 hour  Intake 240 ml  Output 845 ml  Net -605 ml   Filed Weights   06/06/17 0316 06/06/17 1331 06/07/17 0332  Weight: 264 lb 3.2 oz (119.8 kg) 250 lb 14.4 oz (113.8 kg) 248 lb 11.2 oz (112.8 kg)    Telemetry    Atrial fibrillation rates in  the 90s- Personally Reviewed  ECG      Physical Exam   GEN: No acute distress.  Significant cough, laying supine Neck: No JVD Cardiac:  Irregularly irregular no murmurs, rubs, or gallops.  1+ pitting edema into the thighs Respiratory:  Coarse breath sounds bilaterally, dullness at the bases GI: Soft, nontender, distended , dull to percussion MS: No edema; No deformity. Neuro:  Nonfocal  Psych: Normal affect   Labs    Chemistry Recent Labs  Lab 06/04/17 1035 06/05/17 0147 06/06/17 0506  NA 140 137 139  K 3.8 3.0* 3.1*  CL 106 100* 98*  CO2 26 28 31   GLUCOSE 117* 103* 91  BUN 15 13 11   CREATININE 0.91 1.01 0.79  CALCIUM 8.9 8.7* 8.9  PROT 7.0  --   --   ALBUMIN 2.7*  --   --   AST 20  --   --   ALT 6*  --   --   ALKPHOS 57  --   --   BILITOT 0.8  --   --   GFRNONAA >60 >60 >60  GFRAA >60 >60 >60  ANIONGAP 8 9 10      Hematology Recent Labs  Lab 06/04/17 1035 06/06/17 0506  WBC 6.3 5.6  RBC 3.30* 3.32*  HGB  9.6* 9.6*  HCT 29.9* 29.6*  MCV 90.6 89.1  MCH 29.2 29.0  MCHC 32.2 32.5  RDW 15.0* 14.5  PLT 177 209    Cardiac Enzymes Recent Labs  Lab 06/04/17 1035 06/04/17 1636 06/04/17 1950 06/05/17 0147  TROPONINI <0.03 <0.03 <0.03 <0.03   No results for input(s): TROPIPOC in the last 168 hours.   BNP Recent Labs  Lab 06/04/17 1035  BNP 146.0*     DDimer No results for input(s): DDIMER in the last 168 hours.   Radiology    Dg Chest 1 View  Result Date: 06/06/2017 CLINICAL DATA:  Follow-up pneumothorax. EXAM: CHEST 1 VIEW COMPARISON:  Chest x-ray 06/06/2017.  CT chest 06/05/2017. FINDINGS: PICC line noted with tip over superior vena cava. Cardiomegaly with mild bilateral interstitial prominence and small bilateral pleural effusions. Findings suggest mild CHF. Stable small left apical pneumothorax. Postsurgical changes left shoulder. IMPRESSION: 1.  Stable small left apical pneumothorax. 2.  Right PICC line noted with tip over superior vena  cava. 3. Cardiomegaly with mild bilateral interstitial prominence and small bilateral pleural effusions. Mild CHF could present in this fashion. Electronically Signed   By: Maisie Fus  Register   On: 06/06/2017 14:18   Dg Chest 1 View  Result Date: 06/06/2017 CLINICAL DATA:  Left thoracentesis. EXAM: CHEST 1 VIEW COMPARISON:  06/04/2017 FINDINGS: Small left pleural effusion. Small left apical pneumothorax measuring less than 10%. Mild bilateral interstitial prominence. No focal consolidation. Stable cardiomediastinal silhouette. No acute osseous abnormality. Left shoulder arthroplasty. IMPRESSION: 1. Small left apical pneumothorax status post thoracentesis. Pneumothorax measures less than 10%. 2. Small persistent left pleural effusion. These results were called by telephone at the time of interpretation on 06/06/2017 at 10:44 am to Dr. Richarda Overlie, who verbally acknowledged these results. Electronically Signed   By: Elige Ko   On: 06/06/2017 10:45   Ct Chest Wo Contrast  Result Date: 06/05/2017 CLINICAL DATA:  Evaluate opacity identified on recent chest radiograph. EXAM: CT CHEST WITHOUT CONTRAST TECHNIQUE: Multidetector CT imaging of the chest was performed following the standard protocol without IV contrast. COMPARISON:  Chest radiograph 06/04/2017 FINDINGS: Cardiovascular: The heart size is enlarged. Trace pericardial fluid. Aortic atherosclerosis. Calcification in the LAD, left circumflex and RCA coronary arteries noted. Mediastinum/Nodes: Normal appearance of the thyroid gland. Normal appearance of the esophagus. Prominent mediastinal lymph nodes are identified without adenopathy. Index right paratracheal node measures 8 mm, image 67/2. Evaluation of the hilar regions limited due to lack of intravenous contrast material. Lungs/Pleura: There is a moderate left pleural effusion. Complete atelectasis and/or consolidation of the left lower lobe is identified. Small right pleural effusion identified. No  interstitial edema identified. In the right upper lobe there is a small pulmonary nodule which measures 4 mm, image 58/3. Upper Abdomen: No acute abnormality. 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 Mild upper T-spine scoliosis noted. No suspicious bone lesions. IMPRESSION: 1. Moderate left pleural effusion with complete atelectasis and/or consolidation of the left lower lobe. Follow-up imaging is advised to ensure resolution of left lower lobe atelectasis/consolidation and rule out underlying malignancy. 2. Small right pleural effusion. 3. 4 mm right upper lobe pulmonary nodule. No follow-up needed if patient is low-risk. Non-contrast chest CT can be considered in 12 months if patient is high-risk. This recommendation follows the consensus statement: Guidelines for Management of Incidental Pulmonary Nodules Detected on CT Images: From the Fleischner Society 2017; Radiology 2017; 284:228-243. 4. Cardiac enlargement, aortic atherosclerosis and 3 vessel coronary artery atherosclerotic calcifications.  Aortic Atherosclerosis (ICD10-I70.0). Electronically Signed   By: Signa Kell M.D.   On: 06/05/2017 13:46   US Thoracentesis Asp Pleural Space W/img Guide  Result Date: 06/06/2017 INDICATION: Shortness of breath. Pneumonia. Left-sided pleural effusion. Request diagnostic and therapeutic thoracentesis. EXAM: ULTRASOUND GUIDED LEFT THORACENTESIS MEDICATIONS: NoNonene. COMPLICATIONS: None immediate. Postprocedural chest x-ray demonstrates small apical pneumothorax, likely secondary to incomplete re-expansion of the lung. PROCEDURE: An ultrasound guided thoracentesis was thoroughly discussed with the patient and questions answered. The benefits, risks, alternatives and complications were also discussed. The patient understands and wishes to proceed with the procedure. Written consent was obtained. Ultrasound was performed to localize and mark an adequate pocket of fluid in the  left chest. The area was then prepped and draped in the normal sterile fashion. 1% Lidocaine was used for local anesthesia. Under ultrasound guidance a Safe-T-Centesis catheter was introduced. Thoracentesis was performed. The catheter was removed and a dressing applied. FINDINGS: A total of approximately 1.2 L of clear yellow fluid was removed. Samples were sent to the laboratory as requested by the clinical team. IMPRESSION: Successful ultrasound guided left thoracentesis yielding 1.2 L of pleural fluid. Read by: Brayton El PA-C Electronically Signed   By: Richarda Overlie M.D.   On: 06/06/2017 10:52    Cardiac Studies    January 2019-echocardiogram ([facility])  EF 50%, mild LVH, trivial MR/PR/TR, small pericardial effusion   Patient Profile     77 y.o. male with a history of permanent atrial fibrillation, chronic combined CHF, recent GI bleed and acute blood loss anemia requiring packed red blood cells in the setting of duodenal mass and oral anticoagulation (Eliquis currently on hold), hypokalemia, and alcohol abuse, who was admitted February 24 with cough, dyspnea, volume overload, and left pleural effusion.    Assessment & Plan      1.  Acute on chronic diastolic congestive heart failure:  echo showing EF of 50%.   discharged from the [facility] after admission with GI bleed.   at rehab recently. developing increasing lower extremity swelling and dyspnea,moderate sized left pleural effusion.   -Still with massive volume overload edema in his thighs, distended abdomen Declined Lasix yesterday evening Normal renal function with low potassium, being repleted I would increase Lasix up to 40 IV 3 times daily 6 AM 12 noon and 6 PM Would likely need several more days of diuresis  2.  Left pleural effusion:  Thoracentesis yesterday 1.2 L removed. Secondary to acute on chronic diastolic CHF  3.  Permanent atrial fibrillation:  Rapid ventricular response in the setting of respiratory  distress and volume overload.  Rates more reasonably controlled currently.   Continue beta-blocker and calcium channel blocker therapy.   Previously on Eliquis however this, this is on hold in the setting of recent GI bleed.  4.  Recent GI bleed/duodenal mass:  Eliquis on hold.  H&H stable.   outpt GI f/u with plan for repeat EGD.  No  5.  Hypokalemia: Supplement. On potassium 40 twice daily, may need to give additional dose for 3 times daily dosing today  6.  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   Exacerbated by acute on chronic diastolic CHF, edema in his thighs  7.  Essential hypertension:  Blood pressure soft.  ACE inhibitor on hold. 104 systolic this morning  8.  Chest congestion, pneumonia Thick Coughing concerning for pneumonia/bronchitis, on antibiotics    Total encounter time more than 25 minutes  Greater than 50% was  spent in counseling and coordination of care with the patient   For questions or updates, please contact [facility] Please consult [email or web address] for contact info under Cardiology/STEMI.      Signed, Julien Nordmannimothy Imri Lor, MD  06/07/2017, 9:03 AM

## 2017-06-07 NOTE — Progress Notes (Signed)
PT Hold Note  Patient Details Name: Carlos Roach MRN: [id number] DOB: [birth year]   Evaluation Hold:    Reason Eval/Treat Not Completed: Medical issues which prohibited therapy. Chart reviewed. Pt underwent thoracentesis today with a 10% pneumothorax noted following procedure. Spoke with MD yesterday and per notes pt was to be observed overnight with cxr repeated today AM. Currently no active orders for repeat cxr today. RN contacted who was aware of issue and is planning to page hospitalist today. Will hold PT evaluation until plan of care is clear for today.  Sharalyn InkJason D Chameka Mcmullen PT, DPT   Johnice Riebe 06/07/2017, 10:15 AM

## 2017-06-07 NOTE — Progress Notes (Signed)
During rounding MD notified of Cardizem being held sometimes 2/2 SBP<100.  I will continue to assess.

## 2017-06-07 NOTE — Progress Notes (Signed)
[facility] - [place] at [facility]   PATIENT NAME: Carlos Roach    MR#:  [id number]  DATE OF BIRTH:  [birth year]  SUBJECTIVE:   Patient is here due to cough, lower extremity swelling, shortness of breath and noted to be in congestive heart failure. Patient is status post left-sided thoracentesis yesterday with 1.2 L of fluid removed. Continue IV diuresis as per cardiology. Shortness of breath has improved.  REVIEW OF SYSTEMS:    Review of Systems  Constitutional: Negative for chills and fever.  HENT: Negative for congestion and tinnitus.   Eyes: Negative for blurred vision and double vision.  Respiratory: Positive for shortness of breath. Negative for cough and wheezing.   Cardiovascular: Positive for leg swelling. Negative for chest pain, orthopnea and PND.  Gastrointestinal: Negative for abdominal pain, diarrhea, nausea and vomiting.  Genitourinary: Negative for dysuria and hematuria.  Neurological: Negative for dizziness, sensory change and focal weakness.  All other systems reviewed and are negative.   Nutrition: Heart Healthy Tolerating Diet: Yes Tolerating PT: Eval noted.    DRUG ALLERGIES:   Allergies  Allergen Reactions  . Mucinex [Guaifenesin Er] Rash    VITALS:  Blood pressure (!) 92/54, pulse 89, temperature 98.8 F (37.1 C), temperature source Oral, resp. rate 17, height 6' (1.829 m), weight 112.8 kg (248 lb 11.2 oz), SpO2 94 %.  PHYSICAL EXAMINATION:   Physical Exam  GENERAL:  77 y.o.-year-old obese patient lying in bed in no acute distress.  EYES: Pupils equal, round, reactive to light and accommodation. No scleral icterus. Extraocular muscles intact.  HEENT: Head atraumatic, normocephalic. Oropharynx and nasopharynx clear.  NECK:  Supple, no jugular venous distention. No thyroid enlargement, no tenderness.  LUNGS: Normal breath sounds bilaterally, no wheezing, basilar rales, No rhonchi. No use of accessory muscles of respiration.   CARDIOVASCULAR: S1, S2 normal. No murmurs, rubs, or gallops.  ABDOMEN: Soft, nontender, nondistended. Bowel sounds present. No organomegaly or mass.  EXTREMITIES: No cyanosis, clubbing, +1-2 edema b/l NEUROLOGIC: Cranial nerves II through XII are intact. No focal Motor or sensory deficits b/l.  Globally weak.  PSYCHIATRIC: The patient is alert and oriented x 3.  SKIN: No obvious rash, lesion, or ulcer.    LABORATORY PANEL:   CBC Recent Labs  Lab 06/06/17 0506  WBC 5.6  HGB 9.6*  HCT 29.6*  PLT 209   ------------------------------------------------------------------------------------------------------------------  Chemistries  Recent Labs  Lab 06/04/17 1035 06/05/17 0147 06/06/17 0506  NA 140 137 139  K 3.8 3.0* 3.1*  CL 106 100* 98*  CO2 26 28 31   GLUCOSE 117* 103* 91  BUN 15 13 11   CREATININE 0.91 1.01 0.79  CALCIUM 8.9 8.7* 8.9  MG  --  1.6*  --   AST 20  --   --   ALT 6*  --   --   ALKPHOS 57  --   --   BILITOT 0.8  --   --    ------------------------------------------------------------------------------------------------------------------  Cardiac Enzymes Recent Labs  Lab 06/05/17 0147  TROPONINI <0.03   ------------------------------------------------------------------------------------------------------------------  RADIOLOGY:  Dg Chest 1 View  Result Date: 06/06/2017 CLINICAL DATA:  Follow-up pneumothorax. EXAM: CHEST 1 VIEW COMPARISON:  Chest x-ray 06/06/2017.  CT chest 06/05/2017. FINDINGS: PICC line noted with tip over superior vena cava. Cardiomegaly with mild bilateral interstitial prominence and small bilateral pleural effusions. Findings suggest mild CHF. Stable small left apical pneumothorax. Postsurgical changes left shoulder. IMPRESSION: 1.  Stable small left apical pneumothorax. 2.  Right PICC line  noted with tip over superior vena cava. 3. Cardiomegaly with mild bilateral interstitial prominence and small bilateral pleural effusions. Mild CHF  could present in this fashion. Electronically Signed   By: Maisie Fushomas  Register   On: 06/06/2017 14:18   Dg Chest 1 View  Result Date: 06/06/2017 CLINICAL DATA:  Left thoracentesis. EXAM: CHEST 1 VIEW COMPARISON:  06/04/2017 FINDINGS: Small left pleural effusion. Small left apical pneumothorax measuring less than 10%. Mild bilateral interstitial prominence. No focal consolidation. Stable cardiomediastinal silhouette. No acute osseous abnormality. Left shoulder arthroplasty. IMPRESSION: 1. Small left apical pneumothorax status post thoracentesis. Pneumothorax measures less than 10%. 2. Small persistent left pleural effusion. These results were called by telephone at the time of interpretation on 06/06/2017 at 10:44 am to Dr. Richarda OverlieADAM HENN, who verbally acknowledged these results. Electronically Signed   By: Elige KoHetal  Patel   On: 06/06/2017 10:45   [place] Thoracentesis Asp Pleural Space W/img Guide  Result Date: 06/06/2017 INDICATION: Shortness of breath. Pneumonia. Left-sided pleural effusion. Request diagnostic and therapeutic thoracentesis. EXAM: ULTRASOUND GUIDED LEFT THORACENTESIS MEDICATIONS: NoNonene. COMPLICATIONS: None immediate. Postprocedural chest x-ray demonstrates small apical pneumothorax, likely secondary to incomplete re-expansion of the lung. PROCEDURE: An ultrasound guided thoracentesis was thoroughly discussed with the patient and questions answered. The benefits, risks, alternatives and complications were also discussed. The patient understands and wishes to proceed with the procedure. Written consent was obtained. Ultrasound was performed to localize and mark an adequate pocket of fluid in the left chest. The area was then prepped and draped in the normal sterile fashion. 1% Lidocaine was used for local anesthesia. Under ultrasound guidance a Safe-T-Centesis catheter was introduced. Thoracentesis was performed. The catheter was removed and a dressing applied. FINDINGS: A total of approximately 1.2 L of  clear yellow fluid was removed. Samples were sent to the laboratory as requested by the clinical team. IMPRESSION: Successful ultrasound guided left thoracentesis yielding 1.2 L of pleural fluid. Read by: Brayton ElKevin Bruning PA-C Electronically Signed   By: Richarda OverlieAdam  Henn M.D.   On: 06/06/2017 10:52     ASSESSMENT AND PLAN:   77 year old male with past medical history of hypertension, chronic systolic CHF, chronic atrial fibrillation, chronic venous stasis, previous history of GI bleed who presented to the hospital due to shortness of breath.  1. Acute on chronic diastolic CHF-status post left-sided thoracentesis yesterday with 1.2 L of fluid removed. -Still has significant volume overload with overt edema in his thighs and distended abdomen. -Continue IV Lasix, follow I's and O's and daily weights.-Continue metoprolol, Cardizem  2. Chronic atrial fibrillation-rate controlled, continue Cardizem, metoprolol. -Patient was on Eliquis but currently on hold given the recent GI bleed.  3. Left-sided pleural effusion-secondary to the CHF. Patient is status post ultrasound-guided thoracentesis with 1.2 L of fluid removed. We'll continue to monitor.  4. Chronic lower extremity edema with venous stasis-appreciate wound care consult, legs are currently wrapped. Continue local wound care.  5. Essential hypertension-continue metoprolol, Cardizem. Blood pressure but on the low side and therefore ACE inhibitor on hold.  6. Hypokalemia/hypomagnesemia-we'll replace accordingly and repeat level in the morning.  7. History of recent GI bleed-currently hemoglobin stable. No acute bleeding. Hold off on anticoagulation. Continue Protonix.    All the records are reviewed and case discussed with Care Management/Social Worker. Management plans discussed with the patient, family and they are in agreement.  CODE STATUS: Full code  DVT Prophylaxis: Ted's & SCD's.   TOTAL TIME TAKING CARE OF THIS PATIENT: 30 minutes.    POSSIBLE  D/C IN 1-2 DAYS, DEPENDING ON CLINICAL CONDITION.   Houston Siren M.D on 06/07/2017 at 3:01 PM  Between 7am to 6pm - Pager - [phone]  After 6pm go to [email or web address] - Social research officer, government  [facility] [place] [facility]  Office  [phone]  CC: Primary care physician; Rayetta Humphrey, MD

## 2017-06-08 DIAGNOSIS — I481 Persistent atrial fibrillation: Secondary | ICD-10-CM

## 2017-06-08 DIAGNOSIS — R0600 Dyspnea, unspecified: Secondary | ICD-10-CM

## 2017-06-08 LAB — BASIC METABOLIC PANEL
ANION GAP: 9 (ref 5–15)
BUN: 11 mg/dL (ref 6–20)
CALCIUM: 8.8 mg/dL — AB (ref 8.9–10.3)
CHLORIDE: 95 mmol/L — AB (ref 101–111)
CO2: 34 mmol/L — AB (ref 22–32)
Creatinine, Ser: 0.74 mg/dL (ref 0.61–1.24)
GFR calc non Af Amer: >60 mL/min (ref >60)
Glucose, Bld: 102 mg/dL — ABNORMAL HIGH (ref 65–99)
POTASSIUM: 3.2 mmol/L — AB (ref 3.5–5.1)
Sodium: 138 mmol/L (ref 135–145)

## 2017-06-08 LAB — MAGNESIUM: Magnesium: 2 mg/dL (ref 1.7–2.4)

## 2017-06-08 MED ORDER — POTASSIUM CHLORIDE CRYS ER 20 MEQ PO TBCR
40.0000 meq | EXTENDED_RELEASE_TABLET | Freq: Three times a day (TID) | ORAL | Status: DC
  Administered 2017-06-08 – 2017-06-09 (×4): 40 meq via ORAL
  Filled 2017-06-08 (×4): qty 2

## 2017-06-08 MED ORDER — DILTIAZEM HCL ER COATED BEADS 120 MG PO CP24
120.0000 mg | ORAL_CAPSULE | Freq: Every day | ORAL | Status: DC
  Administered 2017-06-08 – 2017-06-10 (×3): 120 mg via ORAL
  Filled 2017-06-08 (×3): qty 1

## 2017-06-08 NOTE — Progress Notes (Signed)
Rounded on patient to reiterate HF Education.    Reviewed the steps to living better with HF including: 1. Weigh Daily - Compare weight to previous day's weight to watch for increase.   2. Assess symptoms according to HF Zones 3. Take medications as prescribed. 4.  Low sodium diet 5. Exercise - Stay as active as possible.    Wife not present at bedside today as patient informed this RN that [place] had to [place] to [place] to take care of a few things.  Additional education materials provided to patient on Heart Failure -  1. Guide to Understanding Heart Failure 2. The Heart Failure Handbook.     Will follow-up with patient and wife tomorrow.    Army Meliaiane Wright, RN, BSN, [facility]  Cardiovascular and Pulmonary Nurse Navigator

## 2017-06-08 NOTE — Progress Notes (Signed)
[facility] - [place] at [facility]   PATIENT NAME: Carlos Roach    MR#:  [id number]  DATE OF BIRTH:  07/22/1940  SUBJECTIVE:   Patient is here due to cough, lower extremity swelling, shortness of breath and noted to be in congestive heart failure. Patient is status post left-sided thoracentesis yesterday with 1.2 L of fluid removed.  Improving with diuresis, leg swelling has improved. No further worsening shortness of breath.  REVIEW OF SYSTEMS:    Review of Systems  Constitutional: Negative for chills and fever.  HENT: Negative for congestion and tinnitus.   Eyes: Negative for blurred vision and double vision.  Respiratory: Positive for shortness of breath. Negative for cough and wheezing.   Cardiovascular: Positive for leg swelling. Negative for chest pain, orthopnea and PND.  Gastrointestinal: Negative for abdominal pain, diarrhea, nausea and vomiting.  Genitourinary: Negative for dysuria and hematuria.  Neurological: Negative for dizziness, sensory change and focal weakness.  All other systems reviewed and are negative.   Nutrition: Heart Healthy Tolerating Diet: Yes Tolerating PT: Eval noted.    DRUG ALLERGIES:   Allergies  Allergen Reactions  . Mucinex [Guaifenesin Er] Rash    VITALS:  Blood pressure (P) 107/66, pulse (P) 95, temperature 97.7 F (36.5 C), temperature source Oral, resp. rate (P) 18, height 6' (1.829 m), weight 111.4 kg (245 lb 9.6 oz), SpO2 (P) 96 %.  PHYSICAL EXAMINATION:   Physical Exam  GENERAL:  77 y.o.-year-old obese patient lying in bed in no acute distress.  EYES: Pupils equal, round, reactive to light and accommodation. No scleral icterus. Extraocular muscles intact.  HEENT: Head atraumatic, normocephalic. Oropharynx and nasopharynx clear.  NECK:  Supple, no jugular venous distention. No thyroid enlargement, no tenderness.  LUNGS: Normal breath sounds bilaterally, no wheezing, basilar rales, No rhonchi. No use of  accessory muscles of respiration.  CARDIOVASCULAR: S1, S2 normal. No murmurs, rubs, or gallops.  ABDOMEN: Soft, nontender, nondistended. Bowel sounds present. No organomegaly or mass.  EXTREMITIES: No cyanosis, clubbing, +1-2 edema b/l NEUROLOGIC: Cranial nerves II through XII are intact. No focal Motor or sensory deficits b/l.  Globally weak.  PSYCHIATRIC: The patient is alert and oriented x 3.  SKIN: No obvious rash, lesion, or ulcer.    LABORATORY PANEL:   CBC Recent Labs  Lab 06/06/17 0506  WBC 5.6  HGB 9.6*  HCT 29.6*  PLT 209   ------------------------------------------------------------------------------------------------------------------  Chemistries  Recent Labs  Lab 06/04/17 1035  06/08/17 0527  NA 140   < > 138  K 3.8   < > 3.2*  CL 106   < > 95*  CO2 26   < > 34*  GLUCOSE 117*   < > 102*  BUN 15   < > 11  CREATININE 0.91   < > 0.74  CALCIUM 8.9   < > 8.8*  MG  --    < > 2.0  AST 20  --   --   ALT 6*  --   --   ALKPHOS 57  --   --   BILITOT 0.8  --   --    < > = values in this interval not displayed.   ------------------------------------------------------------------------------------------------------------------  Cardiac Enzymes Recent Labs  Lab 06/05/17 0147  TROPONINI <0.03   ------------------------------------------------------------------------------------------------------------------  RADIOLOGY:  Dg Chest 1 View  Result Date: 06/06/2017 CLINICAL DATA:  Follow-up pneumothorax. EXAM: CHEST 1 VIEW COMPARISON:  Chest x-ray 06/06/2017.  CT chest 06/05/2017. FINDINGS: PICC line noted with tip  over superior vena cava. Cardiomegaly with mild bilateral interstitial prominence and small bilateral pleural effusions. Findings suggest mild CHF. Stable small left apical pneumothorax. Postsurgical changes left shoulder. IMPRESSION: 1.  Stable small left apical pneumothorax. 2.  Right PICC line noted with tip over superior vena cava. 3. Cardiomegaly with  mild bilateral interstitial prominence and small bilateral pleural effusions. Mild CHF could present in this fashion. Electronically Signed   By: Maisie Fushomas  Register   On: 06/06/2017 14:18     ASSESSMENT AND PLAN:   77 year old male with past medical history of hypertension, chronic systolic CHF, chronic atrial fibrillation, chronic venous stasis, previous history of GI bleed who presented to the hospital due to shortness of breath.  1. Acute on chronic diastolic CHF-status post left-sided thoracentesis yesterday with 1.2 L of fluid removed. -Still has significant volume overload with overt edema in his thighs and distended abdomen. -Continue IV Lasix, and responding well and possible switch to Oral diuretics in a.m. Discussed w/ Cardiology.  - Continue metoprolol, Cardizem  2. Chronic atrial fibrillation-rate controlled, continue Cardizem, metoprolol. -Patient was on Eliquis but currently on hold given the recent GI bleed.  3. Left-sided pleural effusion-secondary to the CHF. Patient is status post ultrasound-guided thoracentesis with 1.2 L of fluid removed. Stable.   4. Chronic lower extremity edema with venous stasis-appreciate wound care consult, legs are currently wrapped. Continue local wound care.  5. Essential hypertension-continue metoprolol, Cardizem. Blood pressure but on the low side and therefore ACE inhibitor on hold.  6. Hypokalemia/hypomagnesemia- due to IV diuretics.  -  Mg. Level normal.  Cont. Oral Potassium supplementation. .  7. History of recent GI bleed-currently hemoglobin stable. No acute bleeding. Hold off on anticoagulation. Continue Protonix.    All the records are reviewed and case discussed with Care Management/Social Worker. Management plans discussed with the patient, family and they are in agreement.  CODE STATUS: Full code  DVT Prophylaxis: Ted's & SCD's.   TOTAL TIME TAKING CARE OF THIS PATIENT: 30 minutes.   POSSIBLE D/C IN 1-2 DAYS, DEPENDING ON  CLINICAL CONDITION.   [place] SirenSAINANI,Lavoris Canizales J M.D on 06/08/2017 at 1:58 PM  Between 7am to 6pm - Pager - [phone]  After 6pm go to [email or web address] - Social research officer, governmentpassword [email or web address]  [facility] [place] [facility]  Office  [phone]  CC: Primary care physician; Rayetta HumphreyGeorge, Sionne A, MD

## 2017-06-08 NOTE — Progress Notes (Addendum)
Progress Note  Patient Name: Desmund Elman Date of Encounter: 06/08/2017  Primary Cardiologist: Lidia Collum, MD - [facility])  Subjective   Thinks he's improving.  Still coughing.  No dyspnea @ rest.  Inpatient Medications    Scheduled Meds: . benzonatate  200 mg Oral TID  . cephALEXin  500 mg Oral Q6H  . diltiazem  30 mg Oral Q6H  . furosemide  40 mg Intravenous TID  . metoprolol tartrate  25 mg Oral BID  . multivitamin with minerals  1 tablet Oral Daily  . potassium chloride  40 mEq Oral BID  . protein supplement shake  11 oz Oral BID BM  . sodium chloride flush  10-40 mL Intracatheter Q12H  . sodium chloride flush  3 mL Intravenous Q12H   Continuous Infusions: . sodium chloride     PRN Meds: sodium chloride, acetaminophen **OR** acetaminophen, bisacodyl, HYDROcodone-acetaminophen, ipratropium-albuterol, metoprolol tartrate, ondansetron **OR** ondansetron (ZOFRAN) IV, polyethylene glycol, sodium chloride flush, sodium chloride flush   Vital Signs    Vitals:   06/07/17 1627 06/07/17 1958 06/07/17 2325 06/08/17 0351  BP: 107/61 (!) 95/56 (!) 98/54 104/61  Pulse: 94 97 86 99  Resp: 14 17  18   Temp: 98.1 F (36.7 C) 98.7 F (37.1 C)  97.7 F (36.5 C)  TempSrc: Oral Oral  Oral  SpO2: 97% 94%  96%  Weight:    245 lb 9.6 oz (111.4 kg)  Height:        Intake/Output Summary (Last 24 hours) at 06/08/2017 0834 Last data filed at 06/08/2017 0300 Gross per 24 hour  Intake 340 ml  Output 2820 ml  Net -2480 ml   Filed Weights   06/06/17 1331 06/07/17 0332 06/08/17 0351  Weight: 250 lb 14.4 oz (113.8 kg) 248 lb 11.2 oz (112.8 kg) 245 lb 9.6 oz (111.4 kg)    Physical Exam   GEN: Well nourished, well developed, in no acute distress.  HEENT: Grossly normal.  Neck: Supple, JVP ~ 12 cm, no carotid bruits, or masses. Cardiac: IR, IR, no murmurs, rubs, or gallops. No clubbing, cyanosis.  LE edema much improved, still with posterior thigh/flank  edema - 1+.    Respiratory:  Respirations regular and unlabored, bibasilar crackles. GI: firm, protbuerant, nontender, BS + x 4. MS: no deformity or atrophy. Skin: warm and dry, no rash. Neuro:  Strength and sensation are intact. Psych: AAOx3.  Normal affect.  Labs    Chemistry Recent Labs  Lab 06/04/17 1035 06/05/17 0147 06/06/17 0506 06/08/17 0527  NA 140 137 139 138  K 3.8 3.0* 3.1* 3.2*  CL 106 100* 98* 95*  CO2 26 28 31  34*  GLUCOSE 117* 103* 91 102*  BUN 15 13 11 11   CREATININE 0.91 1.01 0.79 0.74  CALCIUM 8.9 8.7* 8.9 8.8*  PROT 7.0  --   --   --   ALBUMIN 2.7*  --   --   --   AST 20  --   --   --   ALT 6*  --   --   --   ALKPHOS 57  --   --   --   BILITOT 0.8  --   --   --   GFRNONAA >60 >60 >60 >60  GFRAA >60 >60 >60 >60  ANIONGAP 8 9 10 9      Hematology Recent Labs  Lab 06/04/17 1035 06/06/17 0506  WBC 6.3 5.6  RBC 3.30* 3.32*  HGB 9.6* 9.6*  HCT  29.9* 29.6*  MCV 90.6 89.1  MCH 29.2 29.0  MCHC 32.2 32.5  RDW 15.0* 14.5  PLT 177 209    Cardiac Enzymes Recent Labs  Lab 06/04/17 1035 06/04/17 1636 06/04/17 1950 06/05/17 0147  TROPONINI <0.03 <0.03 <0.03 <0.03     BNP Recent Labs  Lab 06/04/17 1035  BNP 146.0*      Radiology    Dg Chest 1 View  Result Date: 06/06/2017 CLINICAL DATA:  Follow-up pneumothorax. EXAM: CHEST 1 VIEW COMPARISON:  Chest x-ray 06/06/2017.  CT chest 06/05/2017. FINDINGS: PICC line noted with tip over superior vena cava. Cardiomegaly with mild bilateral interstitial prominence and small bilateral pleural effusions. Findings suggest mild CHF. Stable small left apical pneumothorax. Postsurgical changes left shoulder. IMPRESSION: 1.  Stable small left apical pneumothorax. 2.  Right PICC line noted with tip over superior vena cava. 3. Cardiomegaly with mild bilateral interstitial prominence and small bilateral pleural effusions. Mild CHF could present in this fashion. Electronically Signed   By: Maisie Fushomas  Register   On:  06/06/2017 14:18   Dg Chest 1 View  Result Date: 06/06/2017 CLINICAL DATA:  Left thoracentesis. EXAM: CHEST 1 VIEW COMPARISON:  06/04/2017 FINDINGS: Small left pleural effusion. Small left apical pneumothorax measuring less than 10%. Mild bilateral interstitial prominence. No focal consolidation. Stable cardiomediastinal silhouette. No acute osseous abnormality. Left shoulder arthroplasty. IMPRESSION: 1. Small left apical pneumothorax status post thoracentesis. Pneumothorax measures less than 10%. 2. Small persistent left pleural effusion. These results were called by telephone at the time of interpretation on 06/06/2017 at 10:44 am to Dr. Richarda OverlieADAM HENN, who verbally acknowledged these results. Electronically Signed   By: Elige KoHetal  Patel   On: 06/06/2017 10:45   [place] Thoracentesis Asp Pleural Space W/img Guide  Result Date: 06/06/2017 INDICATION: Shortness of breath. Pneumonia. Left-sided pleural effusion. Request diagnostic and therapeutic thoracentesis. EXAM: ULTRASOUND GUIDED LEFT THORACENTESIS MEDICATIONS: NoNonene. COMPLICATIONS: None immediate. Postprocedural chest x-ray demonstrates small apical pneumothorax, likely secondary to incomplete re-expansion of the lung. PROCEDURE: An ultrasound guided thoracentesis was thoroughly discussed with the patient and questions answered. The benefits, risks, alternatives and complications were also discussed. The patient understands and wishes to proceed with the procedure. Written consent was obtained. Ultrasound was performed to localize and mark an adequate pocket of fluid in the left chest. The area was then prepped and draped in the normal sterile fashion. 1% Lidocaine was used for local anesthesia. Under ultrasound guidance a Safe-T-Centesis catheter was introduced. Thoracentesis was performed. The catheter was removed and a dressing applied. FINDINGS: A total of approximately 1.2 L of clear yellow fluid was removed. Samples were sent to the laboratory as requested by  the clinical team. IMPRESSION: Successful ultrasound guided left thoracentesis yielding 1.2 L of pleural fluid. Read by: Brayton ElKevin Bruning PA-C Electronically Signed   By: Richarda OverlieAdam  Henn M.D.   On: 06/06/2017 10:52    Telemetry    Afib, 90's - Personally Reviewed  Cardiac Studies   January 2019-echocardiogram ([facility])  EF 50%, mild LVH, trivial MR/PR/TR, small pericardial effusion  Patient Profile     77 y.o.malewith a history of permanent atrial fibrillation, chronic combined CHF, recent GI bleed and acute blood loss anemia requiring packed red blood cells in the setting of duodenal mass and oral anticoagulation (Eliquis currently on hold), hypokalemia, and alcohol abuse, who was admitted February 24 with cough, dyspnea, volume overload, and left pleural effusion.  Assessment & Plan    1.  Acute on chronic diast CHF:  Admitted with dyspnea,  volume overload, and cough.  Responding well to diuresis.  Minus 2.4L overnight, 9.3L since admission.  Wt down from 271 to 245 (2/8 d/c wt from [facility] was 258 lbs).  Still with flank edema, though legs look much better.  Abd remains semi-firm.  Breathing overall better, but still w/ bibasilar crackles.  Cont IV diuresis today.  When we switch to PO, he might do better w/ torsemide.  HR/BP stable.  2.  L Pleural effusion:  S/p thoracentesis 2/26 - 1.2L removed.  3.  Permanent Afib:  Reasonable rate control on  blocker and dilt.  Will consolidate dilt to long-acting.  4.  Recent GIB/duodenal mass:  Eliquis on hold.  H/H stable (last 2/26).  Has outpt GI f/u with plan for repeat EGD.  5.  Hypokalemia:  K 3.2 this AM.  Escalate KCl to 40 TID.  6.  Chronic venous stasis w/ lower ext cellulitis: legs are wrapped  Abx per IM.  7.  Essential HTN:  Stable.  Home dose of acei on hold - soft.  Signed, Nicolasa Ducking, NP  06/08/2017, 8:34 AM    For questions or updates, please contact   Please consult [email or web address] for contact info under  Cardiology/STEMI.   Attending Note Patient seen and examined, agree with detailed note above,  Patient presentation and plan discussed on rounds.   Significant coughing, thick, bronchitic Reports that shortness of breath is slowly improving, still not at baseline Ambulated yesterday out of bed short distance, significant coughing with deep inspiration   On physical exam laying supine in bed, significant thick cough, unable to estimate JVP, lungs with coarse breath sounds particularly at the bases, heart sounds irregularly irregular , abdomen distended dull to percussion, flank edema trace to 1+, trace edema in the thighs  -2.5 L yesterday, -10 L total  A.P:   1. Acute on chronic diastolic congestive heart failure:  echo showing EF of 50%.  -10 L this admission still with fluid Normal renal function   Lasix IV 3 times daily  Aggressive potassium repletion  2. Left pleural effusion:  Thoracentesis  1.2 L removed. Secondary to acute on chronic diastolic CHF  3. Permanent atrial fibrillation:  Rapid ventricular response in the setting of respiratory distress and volume overload. Rates more reasonably controlled currently.  Continue beta-blocker and calcium channel blocker therapy.  Previously on Eliquis however this, this is on hold in the setting of recent GI bleed.  4. Recent GI bleed/duodenal mass: Eliquis on hold. H&H stable.   outpt GI f/u with plan for repeat EGD.  No  5. Hypokalemia: Supplement. On potassium 40 tid  6. 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  Exacerbated by acute on chronic diastolic CHF, edema in his thighs/flank  Needs at least additional day of diuresis  7. Essential hypertension:  Blood pressure soft. ACE inhibitor on hold.  8.  Chest congestion, pneumonia Thick Coughing concerning for pneumonia/bronchitis, on antibiotics  Greater than 50% was spent in counseling and coordination of  care with patient Total encounter time 25 minutes or more   Signed: Dossie Arbour  M.D., Ph.D. [facility]

## 2017-06-09 LAB — CULTURE, BLOOD (ROUTINE X 2)
CULTURE: NO GROWTH
Culture: NO GROWTH
SPECIAL REQUESTS: ADEQUATE
Special Requests: ADEQUATE

## 2017-06-09 LAB — BASIC METABOLIC PANEL
Anion gap: 6 (ref 5–15)
BUN: 13 mg/dL (ref 6–20)
CO2: 34 mmol/L — ABNORMAL HIGH (ref 22–32)
CREATININE: 0.69 mg/dL (ref 0.61–1.24)
Calcium: 9 mg/dL (ref 8.9–10.3)
Chloride: 97 mmol/L — ABNORMAL LOW (ref 101–111)
GFR calc Af Amer: >60 mL/min (ref >60)
GLUCOSE: 104 mg/dL — AB (ref 65–99)
Potassium: 3.5 mmol/L (ref 3.5–5.1)
Sodium: 137 mmol/L (ref 135–145)

## 2017-06-09 MED ORDER — FUROSEMIDE 10 MG/ML IJ SOLN
80.0000 mg | Freq: Two times a day (BID) | INTRAMUSCULAR | Status: DC
  Administered 2017-06-09 – 2017-06-10 (×2): 80 mg via INTRAVENOUS
  Filled 2017-06-09 (×2): qty 8

## 2017-06-09 MED ORDER — FUROSEMIDE 10 MG/ML IJ SOLN
40.0000 mg | Freq: Once | INTRAMUSCULAR | Status: AC
  Administered 2017-06-09: 40 mg via INTRAVENOUS
  Filled 2017-06-09: qty 4

## 2017-06-09 MED ORDER — POTASSIUM CHLORIDE CRYS ER 20 MEQ PO TBCR
40.0000 meq | EXTENDED_RELEASE_TABLET | Freq: Four times a day (QID) | ORAL | Status: DC
  Administered 2017-06-09 – 2017-06-10 (×3): 40 meq via ORAL
  Filled 2017-06-09 (×3): qty 2

## 2017-06-09 NOTE — Progress Notes (Signed)
[facility]   PATIENT NAME: Carlos IrishRichard Roach    MR#:  [id number]  DATE OF BIRTH:  05-05-1940  SUBJECTIVE:   Shortness of breath improved, about 12 L negative since admission, volume status is improved and overall feels better.  REVIEW OF SYSTEMS:    Review of Systems  Constitutional: Negative for chills and fever.  HENT: Negative for congestion and tinnitus.   Eyes: Negative for blurred vision and double vision.  Respiratory: Positive for shortness of breath. Negative for cough and wheezing.   Cardiovascular: Positive for leg swelling. Negative for chest pain, orthopnea and PND.  Gastrointestinal: Negative for abdominal pain, diarrhea, nausea and vomiting.  Genitourinary: Negative for dysuria and hematuria.  Neurological: Negative for dizziness, sensory change and focal weakness.  All other systems reviewed and are negative.   Nutrition: Heart Healthy Tolerating Diet: Yes Tolerating PT: Eval noted.    DRUG ALLERGIES:   Allergies  Allergen Reactions  . Mucinex [Guaifenesin Er] Rash    VITALS:  Blood pressure (!) 104/57, pulse 75, temperature 97.9 F (36.6 C), temperature source Oral, resp. rate 18, height 6' (1.829 m), weight 107.6 kg (237 lb 3.4 oz), SpO2 95 %.  PHYSICAL EXAMINATION:   Physical Exam  GENERAL:  77 y.o.-year-old obese patient lying in bed in no acute distress.  EYES: Pupils equal, round, reactive to light and accommodation. No scleral icterus. Extraocular muscles intact.  HEENT: Head atraumatic, normocephalic. Oropharynx and nasopharynx clear.  NECK:  Supple, + jugular venous distention. No thyroid enlargement, no tenderness.  LUNGS: Normal breath sounds bilaterally, no wheezing, basilar rales, No rhonchi. No use of accessory muscles of respiration.  CARDIOVASCULAR: S1, S2 normal. No murmurs, rubs, or gallops.  ABDOMEN: Soft, nontender, nondistended. Bowel sounds present. No organomegaly or mass.  EXTREMITIES:  No cyanosis, clubbing, +1-2 edema b/l NEUROLOGIC: Cranial nerves II through XII are intact. No focal Motor or sensory deficits b/l.  Globally weak.  PSYCHIATRIC: The patient is alert and oriented x 3.  SKIN: No obvious rash, lesion, or ulcer.    LABORATORY PANEL:   CBC Recent Labs  Lab 06/06/17 0506  WBC 5.6  HGB 9.6*  HCT 29.6*  PLT 209   ------------------------------------------------------------------------------------------------------------------  Chemistries  Recent Labs  Lab 06/04/17 1035  06/08/17 0527 06/09/17 0601  NA 140   < > 138 137  K 3.8   < > 3.2* 3.5  CL 106   < > 95* 97*  CO2 26   < > 34* 34*  GLUCOSE 117*   < > 102* 104*  BUN 15   < > 11 13  CREATININE 0.91   < > 0.74 0.69  CALCIUM 8.9   < > 8.8* 9.0  MG  --    < > 2.0  --   AST 20  --   --   --   ALT 6*  --   --   --   ALKPHOS 57  --   --   --   BILITOT 0.8  --   --   --    < > = values in this interval not displayed.   ------------------------------------------------------------------------------------------------------------------  Cardiac Enzymes Recent Labs  Lab 06/05/17 0147  TROPONINI <0.03   ------------------------------------------------------------------------------------------------------------------  RADIOLOGY:  No results found.   ASSESSMENT AND PLAN:   77 year old male with past medical history of hypertension, chronic systolic CHF, chronic atrial fibrillation, chronic venous stasis, previous history of GI bleed who presented to the hospital due to  shortness of breath.  1. Acute on chronic diastolic CHF-status post left-sided thoracentesis with 1.2 L of fluid removed. -Still has significant volume overload with overt edema in his thighs and distended abdomen. -Continue IV Lasix and dose advanced by cardiology and responding well and can switch to Oral diuretics by tomorrow or day after. About 12 L (-) since admission.  - Continue metoprolol, Cardizem  2. Chronic atrial  fibrillation-rate controlled, continue Cardizem, metoprolol. -Patient was on Eliquis but currently on hold given the recent GI bleed.  3. Left-sided pleural effusion-secondary to the CHF. Patient is status post ultrasound-guided thoracentesis with 1.2 L of fluid removed. Stable.   4. Chronic lower extremity edema with venous stasis-appreciate wound care consult, legs are currently wrapped in UNNA wraps.   5. Essential hypertension-continue metoprolol, Cardizem. Blood pressure but on the low side and therefore ACE inhibitor on hold.  6. Hypokalemia/hypomagnesemia- due to IV diuretics.  -Improving with supplementation.  7. History of recent GI bleed-currently hemoglobin stable. No acute bleeding. Hold off on anticoagulation. Continue Protonix.    All the records are reviewed and case discussed with Care Management/Social Worker. Management plans discussed with the patient, family and they are in agreement.  CODE STATUS: Full code  DVT Prophylaxis: Ted's & SCD's.   TOTAL TIME TAKING CARE OF THIS PATIENT: 25 minutes.   POSSIBLE D/C IN 1-2 DAYS, DEPENDING ON CLINICAL CONDITION.   Houston Siren M.D on 06/09/2017 at 1:52 PM  Between 7am to 6pm - Pager - [phone]  After 6pm go to [email or web address] - Social research officer, government  [facility]  Office  [phone]  CC: Primary care physician; Rayetta Humphrey, MD

## 2017-06-09 NOTE — Consult Note (Signed)
WOC Nurse wound follow up Wraps are intact with significant decrease in edema today.  Will continue to change weekly.  WOC team will follow.  Maple HudsonKaren Anntionette Madkins RN BSN CWON Pager [phone]

## 2017-06-09 NOTE — Care Management (Addendum)
Anticipate discharge within the next 24 hours.  Update patient and discussed that home health services have been set up.  Dan HumphreysWalker will be delivered to room today.  UPdated [facility] on anticipated discharge date.  Has not qualified for home oxygen.  Is not going to need home IV antibiotics.  Will need order for unna boots to be in home health orders

## 2017-06-09 NOTE — Progress Notes (Signed)
Progress Note  Patient Name: Carlos Roach Date of Encounter: 06/09/2017  Primary Cardiologist: Lidia Collum, MD - [facility])  Subjective   Breathing stable.  Still coughing.  No dyspnea at rest.  Still has some flank edema.  Inpatient Medications    Scheduled Meds: . benzonatate  200 mg Oral TID  . cephALEXin  500 mg Oral Q6H  . diltiazem  120 mg Oral Daily  . furosemide  40 mg Intravenous TID  . metoprolol tartrate  25 mg Oral BID  . multivitamin with minerals  1 tablet Oral Daily  . potassium chloride  40 mEq Oral TID  . protein supplement shake  11 oz Oral BID BM  . sodium chloride flush  10-40 mL Intracatheter Q12H  . sodium chloride flush  3 mL Intravenous Q12H   Continuous Infusions: . sodium chloride     PRN Meds: sodium chloride, acetaminophen **OR** acetaminophen, bisacodyl, HYDROcodone-acetaminophen, ipratropium-albuterol, metoprolol tartrate, ondansetron **OR** ondansetron (ZOFRAN) IV, polyethylene glycol, sodium chloride flush, sodium chloride flush   Vital Signs    Vitals:   06/08/17 2002 06/09/17 0500 06/09/17 0605 06/09/17 0802  BP: 112/63  (!) 101/59 (!) 104/57  Pulse: 92  95 75  Resp:      Temp: 98.2 F (36.8 C)  98.6 F (37 C) 97.9 F (36.6 C)  TempSrc: Oral  Oral Oral  SpO2: 93%  92% 95%  Weight:  237 lb 3.4 oz (107.6 kg)    Height:        Intake/Output Summary (Last 24 hours) at 06/09/2017 0952 Last data filed at 06/09/2017 0900 Gross per 24 hour  Intake 360 ml  Output 2725 ml  Net -2365 ml   Filed Weights   06/07/17 0332 06/08/17 0351 06/09/17 0500  Weight: 248 lb 11.2 oz (112.8 kg) 245 lb 9.6 oz (111.4 kg) 237 lb 3.4 oz (107.6 kg)    Physical Exam   GEN: Well nourished, well developed, in no acute distress.  HEENT: Grossly normal.  Neck: Supple, JVP to jaw.  No carotid bruits, or masses. Cardiac: Irregularly irregular, no murmurs, rubs, or gallops. No clubbing, cyanosis.  No lower extremity edema but he  has persistent bilateral lower back/flank edema.  Radials/DP/PT 2+ and equal bilaterally.  Respiratory:  Respirations regular and unlabored, bibasilar crackles. GI: Remains firm and protuberant.  Nontender., BS + x 4. MS: no deformity or atrophy. Skin: warm and dry, no rash. Neuro:  Strength and sensation are intact. Psych: AAOx3.  Normal affect.  Labs    Chemistry Recent Labs  Lab 06/04/17 1035  06/06/17 0506 06/08/17 0527 06/09/17 0601  NA 140   < > 139 138 137  K 3.8   < > 3.1* 3.2* 3.5  CL 106   < > 98* 95* 97*  CO2 26   < > 31 34* 34*  GLUCOSE 117*   < > 91 102* 104*  BUN 15   < > 11 11 13   CREATININE 0.91   < > 0.79 0.74 0.69  CALCIUM 8.9   < > 8.9 8.8* 9.0  PROT 7.0  --   --   --   --   ALBUMIN 2.7*  --   --   --   --   AST 20  --   --   --   --   ALT 6*  --   --   --   --   ALKPHOS 57  --   --   --   --  BILITOT 0.8  --   --   --   --   GFRNONAA >60   < > >60 >60 >60  GFRAA >60   < > >60 >60 >60  ANIONGAP 8   < > 10 9 6    < > = values in this interval not displayed.     Hematology Recent Labs  Lab 06/04/17 1035 06/06/17 0506  WBC 6.3 5.6  RBC 3.30* 3.32*  HGB 9.6* 9.6*  HCT 29.9* 29.6*  MCV 90.6 89.1  MCH 29.2 29.0  MCHC 32.2 32.5  RDW 15.0* 14.5  PLT 177 209    Cardiac Enzymes Recent Labs  Lab 06/04/17 1035 06/04/17 1636 06/04/17 1950 06/05/17 0147  TROPONINI <0.03 <0.03 <0.03 <0.03      BNP Recent Labs  Lab 06/04/17 1035  BNP 146.0*      Radiology    No results found.  Telemetry    Afib 80's to 90's - Personally Reviewed  Cardiac Studies   January 2019-echocardiogram ([facility])  EF 50%, mild LVH, trivial MR/PR/TR, small pericardial effusion  Patient Profile     77 y.o.malewith a history of permanent atrial fibrillation, chronic combined CHF, recent GI bleed and acute blood loss anemia requiring packed red blood cells in the setting of duodenal mass and oral anticoagulation (Eliquis currently on hold), hypokalemia, and  alcohol abuse, who was admitted February 24 with cough, dyspnea, volume overload, and left pleural effusion.  Assessment & Plan    1.  Acute on chronic diastolic congestive heart failure: Admitted with dyspnea, volume overload, and cough.  He continues to respond well to diuresis though it has been somewhat slow going as he does remain at least mildly volume overloaded at this point.  He was -3 L overnight and 12.1 L since admission.  Weight is down to 237 this morning.  Still with JVD and flank edema.  Will change to Lasix 80 IV twice daily for higher dose today.  BUN and creatinine remain stable.  Hopefully can switch to oral torsemide tomorrow.  Heart rate and blood pressure stable.  2.  Left pleural effusion: Status post thoracentesis February 26 with 1.2 L removed.  3.  Permanent atrial fibrillation: Reasonable rate control on beta-blocker and diltiazem.  4.  Recent GI bleed/duodenal mass: Off of Eliquis [facility].  He has plans for outpatient GI follow-up with repeat EGD.  5.  Hypokalemia: Potassium 3.5 this morning.  He remains on high-dose potassium supplementation.  6.  Chronic venous stasis with lower extremity cellulitis: Legs are wrapped.  No edema.  On oral antibiotics per internal medicine.  7.  Essential hypertension: Stable.  Signed, Nicolasa Duckinghristopher Rhys Anchondo, NP  06/09/2017, 9:52 AM    For questions or updates, please contact   Please consult [email or web address] for contact info under Cardiology/STEMI.

## 2017-06-10 LAB — BASIC METABOLIC PANEL
ANION GAP: 7 (ref 5–15)
BUN: 13 mg/dL (ref 6–20)
CHLORIDE: 99 mmol/L — AB (ref 101–111)
CO2: 35 mmol/L — AB (ref 22–32)
Calcium: 9.3 mg/dL (ref 8.9–10.3)
Creatinine, Ser: 0.7 mg/dL (ref 0.61–1.24)
GFR calc non Af Amer: >60 mL/min (ref >60)
Glucose, Bld: 105 mg/dL — ABNORMAL HIGH (ref 65–99)
Potassium: 3.8 mmol/L (ref 3.5–5.1)
Sodium: 141 mmol/L (ref 135–145)

## 2017-06-10 MED ORDER — DILTIAZEM HCL ER COATED BEADS 120 MG PO CP24: 120.0000 mg | ORAL_CAPSULE | Freq: Every day | ORAL | 0 refills | Status: AC

## 2017-06-10 MED ORDER — TORSEMIDE 20 MG PO TABS: 20.0000 mg | ORAL_TABLET | Freq: Two times a day (BID) | ORAL | 2 refills | Status: DC

## 2017-06-10 MED ORDER — TORSEMIDE 20 MG PO TABS: 20.0000 mg | ORAL_TABLET | Freq: Two times a day (BID) | ORAL | Status: DC

## 2017-06-10 MED ORDER — PREMIER PROTEIN SHAKE: 11.0000 [oz_av] | Freq: Two times a day (BID) | ORAL | 0 refills | Status: DC

## 2017-06-10 MED ORDER — CEPHALEXIN 500 MG PO CAPS: 500.0000 mg | ORAL_CAPSULE | Freq: Four times a day (QID) | ORAL | 0 refills | Status: DC

## 2017-06-10 NOTE — Progress Notes (Signed)
Progress Note  Patient Name: Carlos Roach Date of Encounter: 06/10/2017  Primary Cardiologist: Kateri Mc  Subjective   No chest pain; dyspnea has improved   Inpatient Medications    Scheduled Meds: . benzonatate  200 mg Oral TID  . cephALEXin  500 mg Oral Q6H  . diltiazem  120 mg Oral Daily  . furosemide  80 mg Intravenous BID  . metoprolol tartrate  25 mg Oral BID  . multivitamin with minerals  1 tablet Oral Daily  . potassium chloride  40 mEq Oral QID  . protein supplement shake  11 oz Oral BID BM  . sodium chloride flush  10-40 mL Intracatheter Q12H  . sodium chloride flush  3 mL Intravenous Q12H   Continuous Infusions: . sodium chloride     PRN Meds: sodium chloride, acetaminophen **OR** acetaminophen, bisacodyl, HYDROcodone-acetaminophen, ipratropium-albuterol, metoprolol tartrate, ondansetron **OR** ondansetron (ZOFRAN) IV, polyethylene glycol, sodium chloride flush, sodium chloride flush   Vital Signs    Vitals:   06/09/17 1555 06/09/17 2000 06/10/17 0409 06/10/17 0746  BP: 103/68 113/63 99/71 102/64  Pulse: 87 96 98 88  Resp:  18 17   Temp: 98.2 F (36.8 C) 98.2 F (36.8 C) 97.6 F (36.4 C) 97.8 F (36.6 C)  TempSrc: Oral Oral Oral Oral  SpO2: 94% 94% 94% 94%  Weight:   232 lb 9.6 oz (105.5 kg)   Height:        Intake/Output Summary (Last 24 hours) at 06/10/2017 1003 Last data filed at 06/09/2017 2302 Gross per 24 hour  Intake 120 ml  Output 1025 ml  Net -905 ml   Filed Weights   06/08/17 0351 06/09/17 0500 06/10/17 0409  Weight: 245 lb 9.6 oz (111.4 kg) 237 lb 3.4 oz (107.6 kg) 232 lb 9.6 oz (105.5 kg)    Telemetry    Atrial fibrillation rate controlled - Personally Reviewed   Physical Exam   GEN: No acute distress.   Neck: supple Cardiac: irregular Respiratory: Diminished BS LLL GI: Soft, nontender, non-distended  MS: wrapped; trace edema Neuro:  Nonfocal  Psych: Normal affect   Labs    Chemistry Recent Labs  Lab 06/04/17 1035   06/08/17 0527 06/09/17 0601 06/10/17 0436  NA 140   < > 138 137 141  K 3.8   < > 3.2* 3.5 3.8  CL 106   < > 95* 97* 99*  CO2 26   < > 34* 34* 35*  GLUCOSE 117*   < > 102* 104* 105*  BUN 15   < > 11 13 13   CREATININE 0.91   < > 0.74 0.69 0.70  CALCIUM 8.9   < > 8.8* 9.0 9.3  PROT 7.0  --   --   --   --   ALBUMIN 2.7*  --   --   --   --   AST 20  --   --   --   --   ALT 6*  --   --   --   --   ALKPHOS 57  --   --   --   --   BILITOT 0.8  --   --   --   --   GFRNONAA >60   < > >60 >60 >60  GFRAA >60   < > >60 >60 >60  ANIONGAP 8   < > 9 6 7    < > = values in this interval not displayed.     Hematology Recent Labs  Lab  06/04/17 1035 06/06/17 0506  WBC 6.3 5.6  RBC 3.30* 3.32*  HGB 9.6* 9.6*  HCT 29.9* 29.6*  MCV 90.6 89.1  MCH 29.2 29.0  MCHC 32.2 32.5  RDW 15.0* 14.5  PLT 177 209    Cardiac Enzymes Recent Labs  Lab 06/04/17 1035 06/04/17 1636 06/04/17 1950 06/05/17 0147  TROPONINI <0.03 <0.03 <0.03 <0.03     BNP Recent Labs  Lab 06/04/17 1035  BNP 146.0*     Patient Profile     77 y.o.malewith a history of permanent atrial fibrillation, chronic combined CHF, recent GI bleed and acute blood loss anemia requiring packed red blood cells in the setting of duodenal mass and oral anticoagulation (Eliquis currently on hold), hypokalemia, and alcohol abuse, who was admitted February 24 with cough, dyspnea, volume overload, and left pleural effusion.  Assessment & Plan    1 acute on chronic diastolic congestive heart failure-volume status imporoved. I/O-940 (-[id number] since admission).  Will change Lasix to demadex 20 mg by mouth twice daily.  Patient needs fluid restriction to 1.5 L daily and low-sodium diet.  He will need close follow-up as an outpatient.  Recommend checking potassium and renal function in 5-6 days following discharge.    2 Permanent atrial fibrillation-continue metoprolol and Cardizem for rate control.  He is not on anticoagulation given recent  GI bleed in the setting of duodenal mass.  3 left pleural effusion-status post thoracentesis.  Small residual pneumothorax.  Further management per primary care.  4 Recent GI bleed-patient noted to have duodenal mass.  Anticoagulation on hold.  Follow-up GI after discharge.  5 hypertension-blood pressure is controlled.  Continue present medications.  For questions or updates, please contact [facility] Please consult [email or web address] for contact info under Cardiology/STEMI.      Signed, Olga MillersBrian Annaya Bangert, MD  06/10/2017, 10:03 AM

## 2017-06-10 NOTE — Discharge Summary (Signed)
[facility] [place] [facility]   PATIENT NAME: Carlos Roach    MR#:  [id number]  DATE OF BIRTH:  18-Sep-1940  DATE OF ADMISSION:  06/04/2017 ADMITTING PHYSICIAN: Bertrum Sol, MD  DATE OF DISCHARGE: No discharge date for patient encounter.  PRIMARY CARE PHYSICIAN: Rayetta Humphrey, MD    ADMISSION DIAGNOSIS:  Peripheral edema [R60.9] Acute congestive heart failure, unspecified heart failure type (HCC) [I50.9]  DISCHARGE DIAGNOSIS:  Active Problems:   CHF (congestive heart failure) (HCC)   SECONDARY DIAGNOSIS:   Past Medical History:  Diagnosis Date  . Chronic atrial fibrillation (HCC)    a. CHADS2VASc 5 (CHF, HTN, age x 2, vascular disease)  . Chronic systolic CHF (congestive heart failure) (HCC)    a. TTE 1/19: EF 50%, mild LVH, trivial MR/PR/TR, small pericardial effusion  . Chronic venous stasis   . GI bleed   . Hypertension     HOSPITAL COURSE:  77 year old male with past medical history of hypertension, chronic systolic CHF, chronic atrial fibrillation, chronic venous stasis, previous history of GI bleed who presented to the hospital due to shortness of breath.  1. Acute on chronic diastolic CHF Resolved status post left-sided thoracentesis with 1.2 L of fluid removed Treated with IV Lasix-converted to Demadex 20 mg twice daily per cardiology recommendations, cardiology did see patient while in house, continued metoprolol, Cardizem  2. Chronic atrial fibrillation Stable on Cardizem, metoprolol. Patient was on Eliquis but currently on hold given the recent GI bleed.  3. Left-sided pleural effusion Nearly resolved secondary to the [facility] s/p ultrasound-guided thoracentesis with 1.2 L of fluid removed   4. Chronic lower extremity edema with venous stasis Wound care nurse to see patient while in house, placed on UNNA leg wraps    5. Essential hypertension Stable continue metoprolol, Cardizem.   6.  Hypokalemia/hypomagnesemia due to IV diuretics Repleted   7. History of recent GI bleed Stable hemoglobin stable. No acute bleeding. Anticoagulation avoided for this reason Treated with Protonix.  DISCHARGE CONDITIONS:  On day of discharge patient is afebrile, hemodynamically stable, tolerating diet, to follow-up as directed status post discharge with primary care provider/congestive heart failure clinic, with home health services, for more specific details please see chart  CONSULTS OBTAINED:  Treatment Team:  Marinus Maw, MD Gwyneth Revels, DPM Aaren Atallah, Evelena Asa, MD  DRUG ALLERGIES:   Allergies  Allergen Reactions  . Mucinex [Guaifenesin Er] Rash    DISCHARGE MEDICATIONS:   Allergies as of 06/10/2017      Reactions   Mucinex [guaifenesin Er] Rash      Medication List    STOP taking these medications   furosemide 20 MG tablet Commonly known as:  LASIX   piperacillin-tazobactam IVPB Commonly known as:  ZOSYN   vancomycin 1,000 mg in sodium chloride 0.9 % 250 mL     TAKE these medications   acetaminophen 325 MG tablet Commonly known as:  TYLENOL Take 650 mg by mouth every 6 (six) hours as needed.   albuterol 108 (90 Base) MCG/ACT inhaler Commonly known as:  PROVENTIL HFA;VENTOLIN HFA Inhale 1-2 puffs into the lungs every 6 (six) hours as needed for wheezing or shortness of breath.   benzonatate 200 MG capsule Commonly known as:  TESSALON Take 200 mg by mouth 3 (three) times daily as needed for cough.   cephALEXin 500 MG capsule Commonly known as:  KEFLEX Take 1 capsule (500 mg total) by mouth every 6 (six) hours.   diltiazem  120 MG 24 hr capsule Commonly known as:  CARDIZEM CD Take 1 capsule (120 mg total) by mouth daily. Start taking on:  06/11/2017   feeding supplement (PRO-STAT SUGAR FREE 64) Liqd Take 30 mLs by mouth 2 (two) times daily.   Fish Oil 1000 MG Caps Take 1 capsule by mouth 3 (three) times daily.   folic acid 800 MCG  tablet Commonly known as:  FOLVITE Take 400 mcg by mouth daily.   Melatonin 3 MG Tbdp Take 1 tablet by mouth at bedtime.   metoprolol tartrate 25 MG tablet Commonly known as:  LOPRESSOR Take 25 mg by mouth every 8 (eight) hours.   multivitamin tablet Take 1 tablet by mouth daily.   potassium chloride SA 20 MEQ tablet Commonly known as:  K-DUR,KLOR-CON Take 20 mEq by mouth 2 (two) times daily.   protein supplement shake Liqd Commonly known as:  PREMIER PROTEIN Take 325 mLs (11 oz total) by mouth 2 (two) times daily between meals.   simvastatin 40 MG tablet Commonly known as:  ZOCOR Take 40 mg by mouth daily.   tamsulosin 0.4 MG Caps capsule Commonly known as:  FLOMAX Take 0.4 mg by mouth daily after breakfast.   thiamine 100 MG tablet Take 100 mg by mouth daily.   torsemide 20 MG tablet Commonly known as:  DEMADEX Take 1 tablet (20 mg total) by mouth 2 (two) times daily.            Durable Medical Equipment  (From admission, onward)        Start     Ordered   06/09/17 1146  For home use only DME Walker rolling  Once    Question:  Patient needs a walker to treat with the following condition  Answer:  Difficulty in walking   06/09/17 1148       DISCHARGE INSTRUCTIONS:    If you experience worsening of your admission symptoms, develop shortness of breath, life threatening emergency, suicidal or homicidal thoughts you must seek medical attention immediately by calling 911 or calling your MD immediately  if symptoms less severe.  You Must read complete instructions/literature along with all the possible adverse reactions/side effects for all the Medicines you take and that have been prescribed to you. Take any new Medicines after you have completely understood and accept all the possible adverse reactions/side effects.   Please note  You were cared for by a hospitalist during your hospital stay. If you have any questions about your discharge medications or  the care you received while you were in the hospital after you are discharged, you can call the unit and asked to speak with the hospitalist on call if the hospitalist that took care of you is not available. Once you are discharged, your primary care physician will handle any further medical issues. Please note that NO REFILLS for any discharge medications will be authorized once you are discharged, as it is imperative that you return to your primary care physician (or establish a relationship with a primary care physician if you do not have one) for your aftercare needs so that they can reassess your need for medications and monitor your lab values.    Today   CHIEF COMPLAINT:   Chief Complaint  Patient presents with  . Cough  . Leg Swelling    HISTORY OF PRESENT ILLNESS:  77 y.o. male with a known history per below, recently discharged to inpatient rehab from hospital, presenting from rest home via EMS for  3-week history of progressive cough, worsening lower extremity swelling, has gained up to 5 pounds of fluid per day, in the emergency room patient was found to be tachycardic, tachypneic, BNP 146, EKG noted for A. fib with RVR, chest x-ray noted for heart failure, patient evaluated emergency room, wife at the bedside, clinical exam noted for marked pitting edema up to the thigh bilaterally, avulsed right great toenail with associated mild infection, bilateral lower extremity cellulitis, patient is now been admitted for acute on chronic congestive heart failure exacerbation, bilateral lower extremity cellulitis, A. fib with RVR, and avulsed right great toenail with infection noted  VITAL SIGNS:  Blood pressure 102/64, pulse 88, temperature 97.8 F (36.6 C), temperature source Oral, resp. rate 17, height 6' (1.829 m), weight 105.5 kg (232 lb 9.6 oz), SpO2 94 %.  I/O:    Intake/Output Summary (Last 24 hours) at 06/10/2017 1129 Last data filed at 06/10/2017 1000 Gross per 24 hour  Intake 120  ml  Output 1275 ml  Net -1155 ml    PHYSICAL EXAMINATION:  GENERAL:  77 y.o.-year-old patient lying in the bed with no acute distress.  EYES: Pupils equal, round, reactive to light and accommodation. No scleral icterus. Extraocular muscles intact.  HEENT: Head atraumatic, normocephalic. Oropharynx and nasopharynx clear.  NECK:  Supple, no jugular venous distention. No thyroid enlargement, no tenderness.  LUNGS: Normal breath sounds bilaterally, no wheezing, rales,rhonchi or crepitation. No use of accessory muscles of respiration.  CARDIOVASCULAR: S1, S2 normal. No murmurs, rubs, or gallops.  ABDOMEN: Soft, non-tender, non-distended. Bowel sounds present. No organomegaly or mass.  EXTREMITIES: No pedal edema, cyanosis, or clubbing.  NEUROLOGIC: Cranial nerves II through XII are intact. Muscle strength 5/5 in all extremities. Sensation intact. Gait not checked.  PSYCHIATRIC: The patient is alert and oriented x 3.  SKIN: No obvious rash, lesion, or ulcer.   DATA REVIEW:   CBC Recent Labs  Lab 06/06/17 0506  WBC 5.6  HGB 9.6*  HCT 29.6*  PLT 209    Chemistries  Recent Labs  Lab 06/04/17 1035  06/08/17 0527  06/10/17 0436  NA 140   < > 138   < > 141  K 3.8   < > 3.2*   < > 3.8  CL 106   < > 95*   < > 99*  CO2 26   < > 34*   < > 35*  GLUCOSE 117*   < > 102*   < > 105*  BUN 15   < > 11   < > 13  CREATININE 0.91   < > 0.74   < > 0.70  CALCIUM 8.9   < > 8.8*   < > 9.3  MG  --    < > 2.0  --   --   AST 20  --   --   --   --   ALT 6*  --   --   --   --   ALKPHOS 57  --   --   --   --   BILITOT 0.8  --   --   --   --    < > = values in this interval not displayed.    Cardiac Enzymes Recent Labs  Lab 06/05/17 0147  TROPONINI <0.03    Microbiology Results  Results for orders placed or performed during the hospital encounter of 06/04/17  CULTURE, BLOOD (ROUTINE X 2) w Reflex to ID Panel     Status:  None   Collection Time: 06/04/17  4:36 PM  Result Value Ref Range  Status   Specimen Description BLOOD BLOOD LEFT ARM  Final   Special Requests   Final    BOTTLES DRAWN AEROBIC AND ANAEROBIC Blood Culture adequate volume   Culture   Final    NO GROWTH 5 DAYS Performed at [facility], [address] [place], [place] [address]    Report Status 06/09/2017 FINAL  Final  CULTURE, BLOOD (ROUTINE X 2) w Reflex to ID Panel     Status: None   Collection Time: 06/04/17  4:36 PM  Result Value Ref Range Status   Specimen Description BLOOD LEFT ANTECUBITAL  Final   Special Requests   Final    BOTTLES DRAWN AEROBIC AND ANAEROBIC Blood Culture adequate volume   Culture   Final    NO GROWTH 5 DAYS Performed at [facility], [address] [place], [place] [address]    Report Status 06/09/2017 FINAL  Final  MRSA PCR Screening     Status: None   Collection Time: 06/05/17  6:42 PM  Result Value Ref Range Status   MRSA by PCR NEGATIVE NEGATIVE Final    Comment:        The GeneXpert MRSA Assay (FDA approved for NASAL specimens only), is one component of a comprehensive MRSA colonization surveillance program. It is not intended to diagnose MRSA infection nor to guide or monitor treatment for MRSA infections. Performed at [facility], [address] [place], [place] [address]   Gram stain     Status: None   Collection Time: 06/06/17 10:15 AM  Result Value Ref Range Status   Specimen Description PLEURAL  Final   Special Requests NONE  Final   Gram Stain   Final    FEW WBC NO ORGANISMS SEEN Performed at [facility], [address] [place], [place] [address]    Report Status 06/06/2017 FINAL  Final    RADIOLOGY:  No results found.  EKG:   Orders placed or performed during the hospital encounter of 06/04/17  . EKG 12-Lead  . EKG 12-Lead  . EKG 12-Lead  . EKG 12-Lead      Management plans discussed with the patient, family and they are in agreement.  CODE STATUS:     Code Status Orders  (From  admission, onward)        Start     Ordered   06/04/17 1358  Full code  Continuous     06/04/17 1357    Code Status History    Date Active Date Inactive Code Status Order ID Comments User Context   This patient has a current code status but no historical code status.    Advance Directive Documentation     Most Recent Value  Type of Advance Directive  Living will  Pre-existing out of facility DNR order (yellow form or pink MOST form)  No data  "MOST" Form in Place?  No data      TOTAL TIME TAKING CARE OF THIS PATIENT: 45 minutes.    Evelena Asa Fabiola Mudgett M.D on 06/10/2017 at 11:29 AM  Between 7am to 6pm - Pager - [phone]  After 6pm go to [email or web address] - password [email or web address]  [facility]  Office  [phone]  CC: Primary care physician; Rayetta Humphrey, MD   Note: This dictation was prepared with Dragon dictation along with smaller phrase technology. Any transcriptional errors that result from this process are  unintentional.

## 2017-06-12 NOTE — Progress Notes (Signed)
Home health RN, physical therapy, occupational therapy, social work, Engineer, productionAide

## 2017-06-12 NOTE — Care Management (Addendum)
Received call from Ileene RubensKim Fisher with [facility] regarding discharge referral.  Patient discharged 06/10/2017. Agency was following through Epic and aware of discharge but did not see an order for home health. Report for the weekend CM stated that a home health referral had been initiated. No discharge documentation from weekend CM present.  Have reached out to attending that discharged the patient to obtain the home health orders.  Requested home health proceed with contacting patient and set up initial visit asap..  Agency will schedule patient for a visit today

## 2017-06-12 NOTE — Care Management Important Message (Signed)
Important Message  Patient Details  Name: Carlos IrishRichard Roach MRN: [id number] Date of Birth: 1940-07-23   Medicare Important Message Given:  No  Left weekend CM report that an IM notice needed to be delivered and signed because there it appeared there was not one given and signed on admission  Carlos HongGreene, Edrik Rundle R, RN 06/12/2017, 10:02 AM

## 2017-06-14 NOTE — Progress Notes (Signed)
Patient ID: Carlos Roach, male    DOB: 09-25-1940, 77 y.o.   MRN: [id number]  HPI  Carlos Roach is a 77 y/o male with a history of atrial fibrillation, HTN, GIB, obstructive sleep apnea (wearing CPAP), cellulitis, prior alcohol use and chronic heart failure.   Echo report from 05/04/17 reviewed and showed an EF of 50% along with trivial Carlos/TR.   Admitted 06/04/17 due to acute on chronic heart failure. Cardiology consult obtained. Left sided thoracentesis done with removal of 1.2L of fluid. Initially given IV lasix and then transitioned to oral torsemide. Discharged after 6 days. 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 EGD done, blood transfusions given and medications adjusted. Patient was discharged after 18 days.   He presents today for his initial visit with a chief complaint of swelling in his lower legs. He says this has been present for several weeks and is much improved since most recent hospitalization. He has an associated cough along with this. He denies any fatigue, chest pain, shortness of breath, palpitations, abdominal distention, dizziness, difficulty sleeping or weight gain.   Past Medical History:  Diagnosis Date  . Alcohol use   . Arrhythmia    atrial fibrillation  . Cellulitis   . Chronic atrial fibrillation (HCC)    a. CHADS2VASc 5 (CHF, HTN, age x 2, vascular disease)  . Chronic systolic CHF (congestive heart failure) (HCC)    a. TTE 1/19: EF 50%, mild LVH, trivial Carlos/PR/TR, small pericardial effusion  . Chronic venous stasis   . GI bleed   . Hypertension   . Obstructive sleep apnea    Past Surgical History:  Procedure Laterality Date  . ABDOMINAL SURGERY     Family History  Problem Relation Age of Onset  . Hypertension Father    Social History   Tobacco Use  . Smoking status: Never Smoker  . Smokeless tobacco: Never Used  Substance Use Topics  . Alcohol use: Yes    Comment: none for 3 weeks    Allergies  Allergen Reactions  . Mucinex [Guaifenesin Er] Rash   Prior to Admission medications   Medication Sig Start Date End Date Taking? Authorizing Provider  cephALEXin (KEFLEX) 500 MG capsule Take 1 capsule (500 mg total) by mouth every 6 (six) hours. 06/10/17  Yes Salary, Evelena Asa, MD  diltiazem (CARDIZEM CD) 120 MG 24 hr capsule Take 1 capsule (120 mg total) by mouth daily. 06/11/17  Yes Salary, Evelena Asa, MD  folic acid (FOLVITE) 800 MCG tablet Take 400 mcg by mouth daily.   Yes [provider]  Melatonin 3 MG TBDP Take 1 tablet by mouth at bedtime.   Yes [provider]  metoprolol tartrate (LOPRESSOR) 25 MG tablet Take 25 mg by mouth every 8 (eight) hours.   Yes [provider]  Multiple Vitamin (MULTIVITAMIN) tablet Take 1 tablet by mouth daily.   Yes [provider]  Omega-3 Fatty Acids (FISH OIL) 1000 MG CAPS Take 1 capsule by mouth 3 (three) times daily.   Yes [provider]  potassium chloride SA (K-DUR,KLOR-CON) 20 MEQ tablet Take 20 mEq by mouth 2 (two) times daily.   Yes [provider]  protein supplement shake (PREMIER PROTEIN) LIQD Take 325 mLs (11 oz total) by mouth 2 (two) times daily between meals. 06/10/17  Yes Salary, Evelena Asa, MD  simvastatin (ZOCOR) 40 MG tablet Take 40 mg by mouth daily.   Yes [provider]  tamsulosin (FLOMAX) 0.4 MG CAPS capsule Take 0.4 mg by mouth daily after breakfast.   Yes [provider]  thiamine 100 MG tablet Take 100 mg by mouth daily.   Yes [provider]  torsemide (DEMADEX) 20 MG tablet Take 1 tablet (20 mg total) by mouth 2 (two) times daily. 06/10/17  Yes Salary, Evelena AsaMontell D, MD  benzonatate (TESSALON) 200 MG capsule Take 200 mg by mouth 3 (three) times daily as needed for cough.    [provider]   Review of Systems  Constitutional: Negative for appetite change and fatigue.  HENT: Negative for congestion, postnasal drip and sore throat.    Eyes: Negative.   Respiratory: Positive for cough. Negative for chest tightness and shortness of breath.   Cardiovascular: Positive for leg swelling (wrapped in UNNA boots). Negative for chest pain and palpitations.  Gastrointestinal: Negative for abdominal distention and abdominal pain.  Endocrine: Negative.   Genitourinary: Negative.   Musculoskeletal: Negative for back pain and neck pain.  Skin: Negative.   Allergic/Immunologic: Negative.   Neurological: Negative for dizziness and numbness.  Hematological: Negative for adenopathy. Does not bruise/bleed easily.  Psychiatric/Behavioral: Negative for dysphoric mood and sleep disturbance (sleeping with CPAP and on 1 pillow). The patient is not nervous/anxious.    Vitals:   06/15/17 1028  BP: 107/70  Pulse: 90  Resp: 18  SpO2: 96%  Weight: 238 lb 4 oz (108.1 kg)  Height: 6' (1.829 m)   Wt Readings from Last 3 Encounters:  06/15/17 238 lb 4 oz (108.1 kg)  06/10/17 232 lb 9.6 oz (105.5 kg)   Lab Results  Component Value Date   CREATININE 0.70 06/10/2017   CREATININE 0.69 06/09/2017   CREATININE 0.74 06/08/2017   Physical Exam  Constitutional: He is oriented to person, place, and time. He appears well-developed and well-nourished.  HENT:  Head: Normocephalic and atraumatic.  Neck: Normal range of motion. Neck supple. No JVD present.  Cardiovascular: Normal rate and regular rhythm.  Pulmonary/Chest: Effort normal. He has no wheezes. He has no rales.  Abdominal: Soft. He exhibits no distension. There is no tenderness.  Musculoskeletal: He exhibits edema (currently wrapped in UNNA boots bilaterally). He exhibits no tenderness.  Neurological: He is alert and oriented to person, place, and time.  Skin: Skin is warm and dry.  Psychiatric: He has a normal mood and affect. His behavior is normal. Thought content normal.  Nursing note and vitals reviewed.  Assessment & Plan:   1: Chronic heart failure with preserved ejection  fraction- - NYHA class I - euvolemic today - weighing daily and says that his weight has gradually declined. Instructed to call for an overnight weight gain of >2 pounds or a weekly weight gain of >5 pounds - not adding salt to food except boiled eggs. Has recently started reading food labels for sodium content. Reviewed the importance of keeping daily sodium intake to 2000mg  daily and written dietary information was given to him and his wife regarding this - currently receiving home physical therapy - home health nurse is wrapping legs and is coming twice weekly - BNP on 06/04/17 was 146.0 - saw cardiology Jerolyn [facility] @ [facility]) 05/24/17 - patient reports receiving his flu/pneumonia vaccines for this season - instructed to drink between 40-60 ounces of fluid daily. Says that he drinks ~ 20 ounces of water daily along with 1-2 protein shakes. Wife will check to see how many ounces the shakes are - Pharm D reconciled medications with the patient -  no alcohol in the last 3 weeks - wearing CPAP nightly  2: HTN- - BP looks good today - saw PCP Laural Benes @ [facility]) 01/12/17; has an appointment to get established with Dr. Greggory Stallion on 06/19/17 - BMP from 06/10/17 reviewed and showed sodium 141, potassium 3.8 and GFR >60   3: Atrial fibrillation- - currently on diltiazem and metoprolol tartrate - no anticoagulation due to recent GI bleed  Patient did not bring his medications nor a list. Each medication was verbally reviewed with the patient and he was encouraged to bring the bottles to every visit to confirm accuracy of list.  Return in 1 month or sooner for any questions/problems before then.

## 2017-06-15 ENCOUNTER — Ambulatory Visit: Payer: Medicare Other | Attending: Family | Admitting: Family

## 2017-06-15 ENCOUNTER — Encounter: Payer: Self-pay | Admitting: Family

## 2017-06-15 VITALS — BP 107/70 | HR 90 | Resp 18 | Ht 72.0 in | Wt 238.2 lb

## 2017-06-15 DIAGNOSIS — I482 Chronic atrial fibrillation: Secondary | ICD-10-CM | POA: Insufficient documentation

## 2017-06-15 DIAGNOSIS — I4891 Unspecified atrial fibrillation: Secondary | ICD-10-CM | POA: Insufficient documentation

## 2017-06-15 DIAGNOSIS — I509 Heart failure, unspecified: Secondary | ICD-10-CM | POA: Diagnosis present

## 2017-06-15 DIAGNOSIS — G4733 Obstructive sleep apnea (adult) (pediatric): Secondary | ICD-10-CM | POA: Insufficient documentation

## 2017-06-15 DIAGNOSIS — I110 Hypertensive heart disease with heart failure: Secondary | ICD-10-CM | POA: Insufficient documentation

## 2017-06-15 DIAGNOSIS — I5032 Chronic diastolic (congestive) heart failure: Secondary | ICD-10-CM

## 2017-06-15 DIAGNOSIS — Z79899 Other long term (current) drug therapy: Secondary | ICD-10-CM | POA: Diagnosis not present

## 2017-06-15 DIAGNOSIS — I480 Paroxysmal atrial fibrillation: Secondary | ICD-10-CM

## 2017-06-15 DIAGNOSIS — I10 Essential (primary) hypertension: Secondary | ICD-10-CM | POA: Insufficient documentation

## 2017-06-15 NOTE — Patient Instructions (Signed)
Continue weighing daily and call for an overnight weight gain of > 2 pounds or a weekly weight gain of >5 pounds. 

## 2017-07-21 NOTE — Progress Notes (Deleted)
Patient ID: Carlos Roach, male    DOB: 1940/09/14, 77 y.o.   MRN: [id number]  HPI  Carlos Roach is a 77 y/o male with a history of atrial fibrillation, HTN, GIB, obstructive sleep apnea (wearing CPAP), cellulitis, prior alcohol use and chronic heart failure.   Echo report from 05/04/17 reviewed and showed an EF of 50% along with trivial Carlos/TR.   Admitted 06/04/17 due to acute on chronic heart failure. Cardiology consult obtained. Left sided thoracentesis done with removal of 1.2L of fluid. Initially given IV lasix and then transitioned to oral torsemide. Discharged after 6 days. 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 EGD done, blood transfusions given and medications adjusted. Patient was discharged after 18 days.   He presents today for a follow-up visit with a chief complaint of   Past Medical History:  Diagnosis Date  . Alcohol use   . Arrhythmia    atrial fibrillation  . Cellulitis   . Chronic atrial fibrillation (HCC)    a. CHADS2VASc 5 (CHF, HTN, age x 2, vascular disease)  . Chronic systolic CHF (congestive heart failure) (HCC)    a. TTE 1/19: EF 50%, mild LVH, trivial Carlos/PR/TR, small pericardial effusion  . Chronic venous stasis   . GI bleed   . Hypertension   . Obstructive sleep apnea    Past Surgical History:  Procedure Laterality Date  . ABDOMINAL SURGERY     Family History  Problem Relation Age of Onset  . Hypertension Father    Social History   Tobacco Use  . Smoking status: Never Smoker  . Smokeless tobacco: Never Used  Substance Use Topics  . Alcohol use: Yes    Comment: none for 3 weeks   Allergies  Allergen Reactions  . Mucinex [Guaifenesin Er] Rash    Review of Systems  Constitutional: Negative for appetite change and fatigue.  HENT: Negative for congestion, postnasal drip and sore throat.   Eyes: Negative.   Respiratory: Positive for cough. Negative for chest tightness and shortness of  breath.   Cardiovascular: Positive for leg swelling (wrapped in UNNA boots). Negative for chest pain and palpitations.  Gastrointestinal: Negative for abdominal distention and abdominal pain.  Endocrine: Negative.   Genitourinary: Negative.   Musculoskeletal: Negative for back pain and neck pain.  Skin: Negative.   Allergic/Immunologic: Negative.   Neurological: Negative for dizziness and numbness.  Hematological: Negative for adenopathy. Does not bruise/bleed easily.  Psychiatric/Behavioral: Negative for dysphoric mood and sleep disturbance (sleeping with CPAP and on 1 pillow). The patient is not nervous/anxious.     Physical Exam  Constitutional: He is oriented to person, place, and time. He appears well-developed and well-nourished.  HENT:  Head: Normocephalic and atraumatic.  Neck: Normal range of motion. Neck supple. No JVD present.  Cardiovascular: Normal rate and regular rhythm.  Pulmonary/Chest: Effort normal. He has no wheezes. He has no rales.  Abdominal: Soft. He exhibits no distension. There is no tenderness.  Musculoskeletal: He exhibits edema (currently wrapped in UNNA boots bilaterally). He exhibits no tenderness.  Neurological: He is alert and oriented to person, place, and time.  Skin: Skin is warm and dry.  Psychiatric: He has a normal mood and affect. His behavior is normal. Thought content normal.  Nursing note and vitals reviewed.  Assessment & Plan:   1: Chronic heart failure with preserved ejection fraction- - NYHA class I - euvolemic today - weighing daily and says that his weight  has gradually declined. Reminded to call for an overnight weight gain of >2 pounds or a weekly weight gain of >5 pounds - not adding salt to food except boiled eggs. Has recently started reading food labels for sodium content. Reviewed the importance of keeping daily sodium intake to 2000mg  daily  - currently receiving home physical therapy - home health nurse is wrapping legs and  is coming twice weekly - BNP on 06/04/17 was 146.0 - saw cardiology Jerolyn [facility] @ [facility]) 05/24/17 - - instructed to drink between 40-60 ounces of fluid daily. Says that he drinks ~ 20 ounces of water daily along with 1-2 protein shakes. Wife will check to see how many ounces the shakes are - Pharm D reconciled medications with the patient - no alcohol in the last 3 weeks - wearing CPAP nightly  2: HTN- - BP looks good today - saw PCP Laural Benes @ [facility]) 01/12/17; has an appointment to get established with Dr. Greggory Stallion on 06/19/17 - BMP from 06/10/17 reviewed and showed sodium 141, potassium 3.8 and GFR >60   3: Atrial fibrillation- - currently on diltiazem and metoprolol tartrate - no anticoagulation due to recent GI bleed  Patient did not bring his medications nor a list. Each medication was verbally reviewed with the patient and he was encouraged to bring the bottles to every visit to confirm accuracy of list.

## 2017-07-24 ENCOUNTER — Ambulatory Visit: Payer: Medicare Other | Admitting: Family

## 2017-07-25 ENCOUNTER — Ambulatory Visit: Payer: Medicare Other | Admitting: Family

## 2017-10-26 ENCOUNTER — Encounter: Payer: Medicare Other | Attending: Nurse Practitioner | Admitting: Nurse Practitioner

## 2017-10-26 DIAGNOSIS — L97221 Non-pressure chronic ulcer of left calf limited to breakdown of skin: Secondary | ICD-10-CM | POA: Diagnosis not present

## 2017-10-26 DIAGNOSIS — I83012 Varicose veins of right lower extremity with ulcer of calf: Secondary | ICD-10-CM | POA: Insufficient documentation

## 2017-10-26 DIAGNOSIS — L97212 Non-pressure chronic ulcer of right calf with fat layer exposed: Secondary | ICD-10-CM | POA: Insufficient documentation

## 2017-10-27 NOTE — Progress Notes (Signed)
LARENCE, THONE ([id number]) Visit Report for 10/26/2017 Chief Complaint Document Details Patient Name: Carlos Roach, Carlos Roach Date of Service: 10/26/2017 9:45 AM Medical Record Number: [id number] Patient Account Number: [id number] Date of Birth/Sex: [birth year] (77 y.o. Male) Treating RN: Phillis Haggis Primary Care Provider: Angus Palms Other Clinician: Referring Provider: Angus Palms Treating Provider/Extender: Kathreen Cosier in Treatment: 0 Information Obtained from: Patient Chief Complaint BLE ulcers Electronic Signature(s) Signed: 10/26/2017 3:25:25 PM By: Bonnell Public Entered By: Bonnell Public on 10/26/2017 15:25:24 Carlos Roach ([id number]) -------------------------------------------------------------------------------- HPI Details Patient Name: Carlos Roach Date of Service: 10/26/2017 9:45 AM Medical Record Number: [id number] Patient Account Number: [id number] Date of Birth/Sex: 10-Dec-1940 (77 y.o. Male) Treating RN: Phillis Haggis Primary Care Provider: Angus Palms Other Clinician: Referring Provider: Angus Palms Treating Provider/Extender: Kathreen Cosier in Treatment: 0 History of Present Illness HPI Description: 10/26/17-He presents for initial evaluation of bilateral lower extremity ulcers. He has been wearing an Unna boot compression over the last several months with minimal improvement in ulcerations. He does give a vague and somewhat confusing history regarding his lower extremity ulcers; unable to do identify if these were traumatic in nature, originally blisters, and more exact timeframe. He has not been evaluated by vascular medicine, he has elevated ABIs bilaterally in the office and will be referred to vascular medicine for evaluation. He has palpable dorsalis pedis and posttib bilaterally but we are unable to increase his compression d/t unreliable ABI results. In my opinion he would tolerate 3-4 layer compression and would have more  benefit than the unna boots. We will initiate topical treatment as some of the injuries appear traumatic with removal of dry unna wrap. He will follow up next week. He has been hospitalized with lower extremity cellulitis in the recent and remote past, most recently in March and discharged on Rocephin. He is currently on no antibiotic therapy and presents with no indication of cellulitis Electronic Signature(s) Signed: 10/26/2017 3:34:09 PM By: Bonnell Public Entered By: Bonnell Public on 10/26/2017 15:34:08 Carlos Roach ([id number]) -------------------------------------------------------------------------------- Physical Exam Details Patient Name: Carlos Roach Date of Service: 10/26/2017 9:45 AM Medical Record Number: [id number] Patient Account Number: [id number] Date of Birth/Sex: [birth year] (77 y.o. Male) Treating RN: Phillis Haggis Primary Care Provider: Angus Palms Other Clinician: Referring Provider: Angus Palms Treating Provider/Extender: Bonnell Public Weeks in Treatment: 0 Respiratory respirations are even and unlabored. clear throughout. Cardiovascular s1 s2 regular rate and rhythm. BLE- palpable DP, PT. BLE- edema, warm to touch, hemosiderin staining. Musculoskeletal ambulates with walker assistance. Psychiatric appears to have poor insight and/or judgement; poor historian. . somewhat flat affect. Electronic Signature(s) Signed: 10/26/2017 3:36:03 PM By: Bonnell Public Entered By: Bonnell Public on 10/26/2017 15:36:03 Carlos Roach ([id number]) -------------------------------------------------------------------------------- Physician Orders Details Patient Name: Carlos Roach Date of Service: 10/26/2017 9:45 AM Medical Record Number: [id number] Patient Account Number: [id number] Date of Birth/Sex: 02/01/1941 (77 y.o. Male) Treating RN: Phillis Haggis Primary Care Provider: Angus Palms Other Clinician: Referring Provider: Angus Palms Treating  Provider/Extender: Kathreen Cosier in Treatment: 0 Verbal / Phone Orders: Yes ClinicianAshok Cordia, Debi Read Back and Verified: Yes Diagnosis Coding Wound Cleansing Wound #1 Left,Anterior Lower Leg o Clean wound with Normal Saline. o Cleanse wound with mild soap and water Wound #2 Left,Medial Lower Leg o Clean wound with Normal Saline. o Cleanse wound with mild soap and water Wound #3 Right,Anterior Lower Leg o Clean wound with Normal Saline. o Cleanse wound with mild soap and water Wound #4 Right,Proximal,Lateral Lower Leg o Clean wound with Normal Saline. o Cleanse  wound with mild soap and water Wound #5 Right,Distal,Lateral Lower Leg o Clean wound with Normal Saline. o Cleanse wound with mild soap and water Anesthetic (add to Medication List) Wound #1 Left,Anterior Lower Leg o Topical Lidocaine 4% cream applied to wound bed prior to debridement (In Clinic Only). Wound #2 Left,Medial Lower Leg o Topical Lidocaine 4% cream applied to wound bed prior to debridement (In Clinic Only). Wound #3 Right,Anterior Lower Leg o Topical Lidocaine 4% cream applied to wound bed prior to debridement (In Clinic Only). Wound #4 Right,Proximal,Lateral Lower Leg o Topical Lidocaine 4% cream applied to wound bed prior to debridement (In Clinic Only). Wound #5 Right,Distal,Lateral Lower Leg o Topical Lidocaine 4% cream applied to wound bed prior to debridement (In Clinic Only). Skin Barriers/Peri-Wound Care Wound #1 Left,Anterior Lower Leg o Moisturizing lotion - not on wounds Wound #2 Left,Medial Lower Leg o Moisturizing lotion - not on wounds Wound #3 Right,Anterior Lower Leg Carlos Roach, Carlos Roach ([id number]) o Moisturizing lotion - not on wounds Wound #4 Right,Proximal,Lateral Lower Leg o Moisturizing lotion - not on wounds Wound #5 Right,Distal,Lateral Lower Leg o Moisturizing lotion - not on wounds Primary Wound Dressing Wound #1 Left,Anterior  Lower Leg o Mepitel One Contact layer - place on wound and then place the hydrafera blue ready transfer on o Hydrafera Blue Ready Transfer - place over the mepitel Wound #2 Left,Medial Lower Leg o Mepitel One Contact layer - place on wound and then place the hydrafera blue ready transfer on o Hydrafera Blue Ready Transfer - place over the mepitel Wound #3 Right,Anterior Lower Leg o Mepitel One Contact layer - place on wound and then place the hydrafera blue ready transfer on o Hydrafera Blue Ready Transfer - place over the mepitel Wound #4 Right,Proximal,Lateral Lower Leg o Mepitel One Contact layer - place on wound and then place the hydrafera blue ready transfer on o Hydrafera Blue Ready Transfer - place over the mepitel Wound #5 Right,Distal,Lateral Lower Leg o Mepitel One Contact layer - place on wound and then place the hydrafera blue ready transfer on o Hydrafera Blue Ready Transfer - place over the mepitel Secondary Dressing Wound #1 Left,Anterior Lower Leg o ABD pad o Conform/Kerlix Wound #2 Left,Medial Lower Leg o ABD pad o Conform/Kerlix Wound #3 Right,Anterior Lower Leg o ABD pad o Conform/Kerlix Wound #4 Right,Proximal,Lateral Lower Leg o ABD pad o Conform/Kerlix Wound #5 Right,Distal,Lateral Lower Leg o ABD pad o Conform/Kerlix Dressing Change Frequency Wound #1 Left,Anterior Lower Leg o Other: - HHRN to go see pt on Friday (tomorrow 10/27/17) and HHRN to go see pt on Tuesday (10/31/17) and then pt to return to Wound Care Center on Thursday Wound #2 Left,Medial Lower Leg Carlos Roach, Carlos Roach ([id number]) o Other: - HHRN to go see pt on Friday (tomorrow 10/27/17) and HHRN to go see pt on Tuesday (10/31/17) and then pt to return to Wound Care Center on Thursday Wound #3 Right,Anterior Lower Leg o Other: - HHRN to go see pt on Friday (tomorrow 10/27/17) and HHRN to go see pt on Tuesday (10/31/17) and then pt to return to  Wound Care Center on Thursday Wound #4 Right,Proximal,Lateral Lower Leg o Other: - HHRN to go see pt on Friday (tomorrow 10/27/17) and HHRN to go see pt on Tuesday (10/31/17) and then pt to return to Wound Care Center on Thursday Wound #5 Right,Distal,Lateral Lower Leg o Other: - HHRN to go see pt on Friday (tomorrow 10/27/17) and HHRN to go see pt on Tuesday (10/31/17) and then  pt to return to Wound Care Center on Thursday Follow-up Appointments Wound #1 Left,Anterior Lower Leg o Return Appointment in 1 week. Wound #2 Left,Medial Lower Leg o Return Appointment in 1 week. Wound #3 Right,Anterior Lower Leg o Return Appointment in 1 week. Wound #4 Right,Proximal,Lateral Lower Leg o Return Appointment in 1 week. Wound #5 Right,Distal,Lateral Lower Leg o Return Appointment in 1 week. Edema Control Wound #1 Left,Anterior Lower Leg o Other: - ace wrap to bilateral legs Wound #2 Left,Medial Lower Leg o Other: - ace wrap to bilateral legs Wound #3 Right,Anterior Lower Leg o Other: - ace wrap to bilateral legs Wound #4 Right,Proximal,Lateral Lower Leg o Other: - ace wrap to bilateral legs Wound #5 Right,Distal,Lateral Lower Leg o Other: - ace wrap to bilateral legs Additional Orders / Instructions Wound #1 Left,Anterior Lower Leg o Increase protein intake. Wound #2 Left,Medial Lower Leg o Increase protein intake. Wound #3 Right,Anterior Lower Leg o Increase protein intake. Carlos Roach, Carlos Roach ([id number]) Wound #4 Right,Proximal,Lateral Lower Leg o Increase protein intake. Wound #5 Right,Distal,Lateral Lower Leg o Increase protein intake. Home Health Wound #1 Left,Anterior Lower Leg o Continue Home Health Visits - [facility] o Home Health Nurse may visit PRN to address patientos wound care needs. o FACE TO FACE ENCOUNTER: MEDICARE and MEDICAID PATIENTS: I certify that this patient is under my care and that I had a face-to-face encounter that  meets the physician face-to-face encounter requirements with this patient on this date. The encounter with the patient was in whole or in part for the following MEDICAL CONDITION: (primary reason for Home Healthcare) MEDICAL NECESSITY: I certify, that based on my findings, NURSING services are a medically necessary home health service. HOME BOUND STATUS: I certify that my clinical findings support that this patient is homebound (i.Carlos Roach., Due to illness or injury, pt requires aid of supportive devices such as crutches, cane, wheelchairs, walkers, the use of special transportation or the assistance of another person to leave their place of residence. There is a normal inability to leave the home and doing so requires considerable and taxing effort. Other absences are for medical reasons / religious services and are infrequent or of short duration when for other reasons). o If current dressing causes regression in wound condition, may D/C ordered dressing product/s and apply Normal Saline Moist Dressing daily until next Wound Healing Center / Other MD appointment. Notify Wound Healing Center of regression in wound condition at [phone]. o Please direct any NON-WOUND related issues/requests for orders to patient's Primary Care Physician Wound #2 Left,Medial Lower Leg o Continue Home Health Visits - [facility] o Home Health Nurse may visit PRN to address patientos wound care needs. o FACE TO FACE ENCOUNTER: MEDICARE and MEDICAID PATIENTS: I certify that this patient is under my care and that I had a face-to-face encounter that meets the physician face-to-face encounter requirements with this patient on this date. The encounter with the patient was in whole or in part for the following MEDICAL CONDITION: (primary reason for Home Healthcare) MEDICAL NECESSITY: I certify, that based on my findings, NURSING services are a medically necessary home health service. HOME BOUND STATUS: I certify that  my clinical findings support that this patient is homebound (i.Carlos Roach., Due to illness or injury, pt requires aid of supportive devices such as crutches, cane, wheelchairs, walkers, the use of special transportation or the assistance of another person to leave their place of residence. There is a normal inability to leave the home and doing so requires considerable and  taxing effort. Other absences are for medical reasons / religious services and are infrequent or of short duration when for other reasons). o If current dressing causes regression in wound condition, may D/C ordered dressing product/s and apply Normal Saline Moist Dressing daily until next Wound Healing Center / Other MD appointment. Notify Wound Healing Center of regression in wound condition at [phone]. o Please direct any NON-WOUND related issues/requests for orders to patient's Primary Care Physician Wound #3 Right,Anterior Lower Leg o Continue Home Health Visits - [facility] o Home Health Nurse may visit PRN to address patientos wound care needs. o FACE TO FACE ENCOUNTER: MEDICARE and MEDICAID PATIENTS: I certify that this patient is under my care and that I had a face-to-face encounter that meets the physician face-to-face encounter requirements with this patient on this date. The encounter with the patient was in whole or in part for the following MEDICAL CONDITION: (primary reason for Home Healthcare) MEDICAL NECESSITY: I certify, that based on my findings, NURSING services are a medically necessary home health service. HOME BOUND STATUS: I certify that my clinical findings support that this patient is homebound (i.Carlos Roach., Due to illness or injury, pt requires aid of supportive devices such as crutches, cane, wheelchairs, walkers, the use of special transportation or the assistance of another person to leave their place of residence. There is a normal inability to leave the home and doing so requires considerable and  taxing effort. Other absences are for medical reasons / religious services and are infrequent or of short duration when for other reasons). Carlos Roach, Carlos Roach ([id number]) o If current dressing causes regression in wound condition, may D/C ordered dressing product/s and apply Normal Saline Moist Dressing daily until next Wound Healing Center / Other MD appointment. Notify Wound Healing Center of regression in wound condition at [phone]. o Please direct any NON-WOUND related issues/requests for orders to patient's Primary Care Physician Wound #4 Right,Proximal,Lateral Lower Leg o Continue Home Health Visits - [facility] o Home Health Nurse may visit PRN to address patientos wound care needs. o FACE TO FACE ENCOUNTER: MEDICARE and MEDICAID PATIENTS: I certify that this patient is under my care and that I had a face-to-face encounter that meets the physician face-to-face encounter requirements with this patient on this date. The encounter with the patient was in whole or in part for the following MEDICAL CONDITION: (primary reason for Home Healthcare) MEDICAL NECESSITY: I certify, that based on my findings, NURSING services are a medically necessary home health service. HOME BOUND STATUS: I certify that my clinical findings support that this patient is homebound (i.Carlos Roach., Due to illness or injury, pt requires aid of supportive devices such as crutches, cane, wheelchairs, walkers, the use of special transportation or the assistance of another person to leave their place of residence. There is a normal inability to leave the home and doing so requires considerable and taxing effort. Other absences are for medical reasons / religious services and are infrequent or of short duration when for other reasons). o If current dressing causes regression in wound condition, may D/C ordered dressing product/s and apply Normal Saline Moist Dressing daily until next Wound Healing Center / Other MD  appointment. Notify Wound Healing Center of regression in wound condition at [phone]. o Please direct any NON-WOUND related issues/requests for orders to patient's Primary Care Physician Wound #5 Right,Distal,Lateral Lower Leg o Continue Home Health Visits - [facility] o Home Health Nurse may visit PRN to address patientos wound care needs. o FACE TO FACE ENCOUNTER:  MEDICARE and MEDICAID PATIENTS: I certify that this patient is under my care and that I had a face-to-face encounter that meets the physician face-to-face encounter requirements with this patient on this date. The encounter with the patient was in whole or in part for the following MEDICAL CONDITION: (primary reason for Home Healthcare) MEDICAL NECESSITY: I certify, that based on my findings, NURSING services are a medically necessary home health service. HOME BOUND STATUS: I certify that my clinical findings support that this patient is homebound (i.Carlos Roach., Due to illness or injury, pt requires aid of supportive devices such as crutches, cane, wheelchairs, walkers, the use of special transportation or the assistance of another person to leave their place of residence. There is a normal inability to leave the home and doing so requires considerable and taxing effort. Other absences are for medical reasons / religious services and are infrequent or of short duration when for other reasons). o If current dressing causes regression in wound condition, may D/C ordered dressing product/s and apply Normal Saline Moist Dressing daily until next Wound Healing Center / Other MD appointment. Notify Wound Healing Center of regression in wound condition at [phone]. o Please direct any NON-WOUND related issues/requests for orders to patient's Primary Care Physician Services and Therapies o Venous Duplex Doppler o Arterial Studies- Bilateral - ABI Patient Medications Allergies: Mucinex Notifications Medication Indication  Start End lidocaine DOSE 1 - topical 4 % cream - 1 cream topical Electronic Signature(s) PIERCE, BAROCIO ([id number]) Signed: 10/26/2017 3:48:25 PM By: Alejandro Mulling Signed: 10/26/2017 4:35:32 PM By: Bonnell Public Entered By: Alejandro Mulling on 10/26/2017 11:10:41 Carlos Roach, Carlos Roach ([id number]) -------------------------------------------------------------------------------- Prescription 10/26/2017 Patient Name: Carlos Roach Provider: Bonnell Public NP Date of Birth: 10-26-1940 NPI#: [id number] Sex: Male DEA#: [id number] Phone #: [phone] License #: Patient Address: [facility] [address] [facility] [place], [place] [address] [address], [address] [place], [place] [address] [phone] Allergies Mucinex Medication Medication: Route: Strength: Form: lidocaine topical 4% cream Class: TOPICAL LOCAL ANESTHETICS Dose: Frequency / Time: Indication: 1 1 cream topical Number of Refills: Number of Units: 0 Generic Substitution: Start Date: End Date: Administered at Substitution Permitted Facility: Yes Time Administered: Time Discontinued: Note to Pharmacy: Signature(s): Date(s): Electronic Signature(s) Signed: 10/26/2017 3:48:25 PM By: Alejandro Mulling Signed: 10/26/2017 4:35:32 PM By: Bonnell Public Entered By: Alejandro Mulling on 10/26/2017 11:10:42 Carlos Roach ([id number]) Carlos Roach, Carlos Roach ([id number]) --------------------------------------------------------------------------------  Problem List Details Patient Name: Carlos Roach Date of Service: 10/26/2017 9:45 AM Medical Record Number: [id number] Patient Account Number: [id number] Date of Birth/Sex: 1940-09-08 (77 y.o. 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 10/26/2017 No Yes L97.221 Non-pressure chronic ulcer of left calf limited to breakdown of 10/26/2017 No Yes skin R60.9 Edema, unspecified 10/26/2017 No Yes Inactive Problems Resolved Problems Electronic Signature(s) Signed: 10/26/2017 3:24:56 PM By: Bonnell Public Entered By: Bonnell Public on 10/26/2017 15:24:55 Carlos Roach ([id number]) -------------------------------------------------------------------------------- Progress Note Details Patient Name: Carlos Roach Date of Service: 10/26/2017 9:45 AM Medical Record Number: [id number] Patient Account Number: [id number] Date of Birth/Sex: Feb 05, 1941 (77 y.o. Male) Treating RN: Phillis Haggis Primary Care  Provider: Angus Palms Other Clinician: Referring Provider: Angus Palms Treating Provider/Extender: Kathreen Cosier in Treatment: 0 Subjective Chief Complaint Information obtained from Patient BLE ulcers History of Present Illness (HPI) 10/26/17-He presents for initial evaluation of bilateral lower extremity ulcers. He has been wearing an Unna boot compression over the last several months with minimal improvement in ulcerations. He does give a vague and somewhat confusing history regarding his lower extremity ulcers; unable to do identify if these were traumatic in nature, originally blisters, and more exact timeframe. He has not been evaluated by vascular medicine, he has elevated ABIs bilaterally in the office and will be referred to vascular medicine for evaluation. He has palpable dorsalis pedis and posttib bilaterally but we are unable to increase his compression d/t unreliable ABI results. In my opinion he would  tolerate 3-4 layer compression and would have more benefit than the unna boots. We will initiate topical treatment as some of the injuries appear traumatic with removal of dry unna wrap. He will follow up next week. He has been hospitalized with lower extremity cellulitis in the recent and remote past, most recently in March and discharged on Rocephin. He is currently on no antibiotic therapy and presents with no indication of cellulitis Wound History Patient presents with 6 open wounds that have been present for approximately 3 months. Patient has been treating wounds in the following manner: unna boots. Laboratory tests have not been performed in the last month. Patient reportedly has not tested positive for an antibiotic resistant organism. Patient reportedly has not tested positive for osteomyelitis. Patient reportedly has not had testing performed to evaluate circulation in the legs. Patient History Information obtained from Patient. Allergies Mucinex Family History Cancer - Siblings, Heart Disease - Father, Hypertension - Father, No family history of Diabetes, Hereditary Spherocytosis, Kidney Disease, Lung Disease, Seizures, Stroke, Thyroid Problems, Tuberculosis. Social History Never smoker, Marital Status - Married, Alcohol Use - Never - quit in January, Drug Use - No History, Caffeine Use - Daily. Medical History Eyes Denies history of Cataracts, Glaucoma, Optic Neuritis Ear/Nose/Mouth/Throat Denies history of Chronic sinus problems/congestion, Middle ear problems Hematologic/Lymphatic Denies history of Anemia, Hemophilia, Human Immunodeficiency Virus, Lymphedema, Sickle Cell Disease Respiratory Carlos Roach, Carlos Roach ([id number]) Patient has history of Sleep Apnea - CPAP Denies history of Aspiration, Asthma, Chronic Obstructive Pulmonary Disease (COPD), Pneumothorax, Tuberculosis Cardiovascular Patient has history of Arrhythmia - a fib, Congestive Heart Failure, Hypertension,  Peripheral Venous Disease Denies history of Angina, Coronary Artery Disease, Deep Vein Thrombosis, Hypotension, Myocardial Infarction, Peripheral Arterial Disease Gastrointestinal Denies history of Cirrhosis , Colitis, Crohn s, Hepatitis A, Hepatitis B, Hepatitis C Endocrine Denies history of Type I Diabetes, Type II Diabetes Genitourinary Denies history of End Stage Renal Disease Immunological Denies history of Lupus Erythematosus, Raynaud s, Scleroderma Integumentary (Skin) Denies history of History of Burn, History of pressure wounds Musculoskeletal Denies history of Gout, Rheumatoid Arthritis, Osteoarthritis, Osteomyelitis Neurologic Denies history of Dementia, Neuropathy, Quadriplegia, Paraplegia, Seizure Disorder Oncologic Denies history of Received Chemotherapy, Received Radiation Psychiatric Denies history of Anorexia/bulimia, Confinement Anxiety Medical And Surgical History Notes Gastrointestinal hx GI bleed, ETOH use Review of Systems (ROS) Constitutional Symptoms (General Health) The patient has no complaints or symptoms. Eyes Denies complaints or symptoms of Dry Eyes, Vision Changes, Glasses / Contacts. Ear/Nose/Mouth/Throat Denies complaints or symptoms of Difficult clearing ears, Sinusitis. Hematologic/Lymphatic Denies complaints or symptoms of Bleeding / Clotting Disorders, Human Immunodeficiency Virus. Respiratory Denies complaints or symptoms of Chronic or frequent coughs, Shortness of Breath. Cardiovascular Complains or has  symptoms of LE edema. Denies complaints or symptoms of Chest pain. Gastrointestinal Denies complaints or symptoms of Frequent diarrhea, Nausea, Vomiting. Endocrine Denies complaints or symptoms of Hepatitis, Thyroid disease, Polydypsia (Excessive Thirst). Genitourinary Denies complaints or symptoms of Kidney failure/ Dialysis, Incontinence/dribbling. Immunological Denies complaints or symptoms of Hives, Itching. Integumentary  (Skin) Complains or has symptoms of Wounds, Swelling. Denies complaints or symptoms of Bleeding or bruising tendency, Breakdown. Musculoskeletal Denies complaints or symptoms of Muscle Pain, Muscle Weakness. Neurologic Denies complaints or symptoms of Numbness/parasthesias, Focal/Weakness. Psychiatric Denies complaints or symptoms of Anxiety, Claustrophobia. Carlos Roach, Carlos Roach ([id number]) Objective Constitutional Vitals Time Taken: 9:55 AM, Height: 72 in, Source: Measured, Weight: 252 lbs, Source: Measured, BMI: 34.2, Temperature: 97.8 F, Pulse: 79 bpm, Respiratory Rate: 16 breaths/min, Blood Pressure: 125/69 mmHg. Respiratory respirations are even and unlabored. clear throughout. Cardiovascular s1 s2 regular rate and rhythm. BLE- palpable DP, PT. BLE- edema, warm to touch, hemosiderin staining. Musculoskeletal ambulates with walker assistance. Psychiatric appears to have poor insight and/or judgement; poor historian. somewhat flat affect. Integumentary (Hair, Skin) Wound #1 status is Open. Original cause of wound was Blister. The wound is located on the Left,Anterior Lower Leg. The wound measures 1.4cm length x 0.7cm width x 0.1cm depth; 0.77cm^2 area and 0.077cm^3 volume. There is Fat Layer (Subcutaneous Tissue) Exposed exposed. There is no tunneling or undermining noted. There is a large amount of serous drainage noted. The wound margin is flat and intact. There is large (67-100%) red granulation within the wound bed. There is a small (1-33%) amount of necrotic tissue within the wound bed including Adherent Slough. The periwound skin appearance exhibited: Hemosiderin Staining. The periwound skin appearance did not exhibit: Callus, Crepitus, Excoriation, Induration, Rash, Scarring, Dry/Scaly, Maceration, Atrophie Blanche, Cyanosis, Ecchymosis, Mottled, Pallor, Rubor, Erythema. Periwound temperature was noted as No Abnormality. The periwound has tenderness on palpation. Wound #2  status is Open. Original cause of wound was Blister. The wound is located on the Left,Medial Lower Leg. The wound measures 1.3cm length x 0.3cm width x 0.1cm depth; 0.306cm^2 area and 0.031cm^3 volume. There is Fat Layer (Subcutaneous Tissue) Exposed exposed. There is no tunneling or undermining noted. There is a large amount of sanguinous drainage noted. The wound margin is flat and intact. There is large (67-100%) red granulation within the wound bed. There is no necrotic tissue within the wound bed. The periwound skin appearance exhibited: Hemosiderin Staining. The periwound skin appearance did not exhibit: Callus, Crepitus, Excoriation, Induration, Rash, Scarring, Dry/Scaly, Maceration, Atrophie Blanche, Cyanosis, Ecchymosis, Mottled, Pallor, Rubor, Erythema. Periwound temperature was noted as No Abnormality. The periwound has tenderness on palpation. Wound #3 status is Open. Original cause of wound was Blister. The wound is located on the Right,Anterior Lower Leg. The wound measures 3.7cm length x 2.5cm width x 0.1cm depth; 7.265cm^2 area and 0.726cm^3 volume. There is Fat Layer (Subcutaneous Tissue) Exposed exposed. There is no tunneling or undermining noted. There is a large amount of sanguinous drainage noted. The wound margin is flat and intact. There is large (67-100%) red granulation within the wound bed. There is a small (1-33%) amount of necrotic tissue within the wound bed including Adherent Slough. The periwound skin appearance exhibited: Hemosiderin Staining. The periwound skin appearance did not exhibit: Callus, Crepitus, Excoriation, Induration, Rash, Scarring, Dry/Scaly, Maceration, Atrophie Blanche, Cyanosis, Ecchymosis, Mottled, Pallor, Rubor, Erythema. Periwound temperature was noted as No Abnormality. The periwound has tenderness on palpation. Wound #4 status is Open. Original cause of wound was Blister. The wound is located on the Right,Proximal,Lateral  [address] [place], Carlos Roach  ([id number]) Leg. The wound measures 3.5cm length x 0.5cm width x 0.1cm depth; 1.374cm^2 area and 0.137cm^3 volume. There is Fat Layer (Subcutaneous Tissue) Exposed exposed. There is no tunneling or undermining noted. There is a large amount of sanguinous drainage noted. The wound margin is flat and intact. There is large (67-100%) red granulation within the wound bed. There is a small (1-33%) amount of necrotic tissue within the wound bed including Adherent Slough. The periwound skin appearance exhibited: Hemosiderin Staining. The periwound skin appearance did not exhibit: Callus, Crepitus, Excoriation, Induration, Rash, Scarring, Dry/Scaly, Maceration, Atrophie Blanche, Cyanosis, Ecchymosis, Mottled, Pallor, Rubor, Erythema. Periwound temperature was noted as No Abnormality. The periwound has tenderness on palpation. Wound #5 status is Open. Original cause of wound was Blister. The wound is located on the Right,Distal,Lateral Lower Leg. The wound measures 3.6cm length x 0.6cm width x 0.1cm depth; 1.696cm^2 area and 0.17cm^3 volume. There is Fat Layer (Subcutaneous Tissue) Exposed exposed. There is no tunneling or undermining noted. There is a large amount of sanguinous drainage noted. The wound margin is flat and intact. There is large (67-100%) red granulation within the wound bed. There is a small (1-33%) amount of necrotic tissue within the wound bed including Adherent Slough. The periwound skin appearance exhibited: Hemosiderin Staining. The periwound skin appearance did not exhibit: Callus, Crepitus, Excoriation, Induration, Rash, Scarring, Dry/Scaly, Maceration, Atrophie Blanche, Cyanosis, Ecchymosis, Mottled, Pallor, Rubor, Erythema. Periwound temperature was noted as No Abnormality. The periwound has tenderness on palpation. 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 Cleanse wound with mild soap and water Wound #2 Left,Medial Lower Leg: Clean wound with Normal Saline. Cleanse wound with mild soap and water Wound #3 Right,Anterior Lower Leg: Clean wound with Normal Saline. Cleanse wound with mild soap and water Wound #4 Right,Proximal,Lateral Lower Leg: Clean wound with Normal Saline. Cleanse wound with mild soap and water Wound #5 Right,Distal,Lateral Lower Leg: Clean wound with Normal Saline. Cleanse wound with mild soap and water Anesthetic (add to Medication List): Wound #1 Left,Anterior Lower Leg: Topical Lidocaine 4% cream applied to wound bed prior to debridement (In Clinic Only). Wound #2 Left,Medial Lower Leg: Carlos Roach, Carlos Roach ([id number]) Topical Lidocaine 4% cream applied to wound bed prior to debridement (In Clinic Only). Wound #3 Right,Anterior Lower Leg: Topical Lidocaine 4% cream applied to wound bed prior to debridement (In Clinic Only). Wound #4 Right,Proximal,Lateral Lower Leg: Topical Lidocaine 4% cream applied to wound bed prior to debridement (In Clinic Only). Wound #5 Right,Distal,Lateral Lower Leg: Topical Lidocaine 4% cream applied to wound bed prior to debridement (In Clinic Only). Skin Barriers/Peri-Wound Care: Wound #1 Left,Anterior Lower Leg: Moisturizing lotion - not on wounds Wound #2 Left,Medial Lower Leg: Moisturizing lotion - not on wounds Wound #3 Right,Anterior Lower Leg: Moisturizing lotion - not on wounds Wound #4 Right,Proximal,Lateral Lower Leg: Moisturizing lotion - not on wounds Wound #5 Right,Distal,Lateral Lower Leg: Moisturizing lotion - not on wounds Primary Wound Dressing: Wound #1 Left,Anterior Lower Leg: Mepitel One Contact layer - place on wound and  then place the hydrafera blue ready transfer on Hydrafera Blue Ready Transfer - place over the mepitel Wound #2 Left,Medial Lower Leg: Mepitel One Contact layer -  place on wound and then place the hydrafera blue ready transfer on Hydrafera Blue Ready Transfer - place over the mepitel Wound #3 Right,Anterior Lower Leg: Mepitel One Contact layer - place on wound and then place the hydrafera blue ready transfer on Hydrafera Blue Ready Transfer - place over the mepitel Wound #4 Right,Proximal,Lateral Lower Leg: Mepitel One Contact layer - place on wound and then place the hydrafera blue ready transfer on Hydrafera Blue Ready Transfer - place over the mepitel Wound #5 Right,Distal,Lateral Lower Leg: Mepitel One Contact layer - place on wound and then place the hydrafera blue ready transfer on Hydrafera Blue Ready Transfer - place over the mepitel Secondary Dressing: Wound #1 Left,Anterior Lower Leg: ABD pad Conform/Kerlix Wound #2 Left,Medial Lower Leg: ABD pad Conform/Kerlix Wound #3 Right,Anterior Lower Leg: ABD pad Conform/Kerlix Wound #4 Right,Proximal,Lateral Lower Leg: ABD pad Conform/Kerlix Wound #5 Right,Distal,Lateral Lower Leg: ABD pad Conform/Kerlix Dressing Change Frequency: Wound #1 Left,Anterior Lower Leg: Other: - HHRN to go see pt on Friday (tomorrow 10/27/17) and HHRN to go see pt on Tuesday (10/31/17) and then pt to [facility] on [facility] Wound #2 Left,Medial Lower Leg: Other: - HHRN to go see pt on Friday (tomorrow 10/27/17) and HHRN to go see pt on Tuesday (10/31/17) and then pt to return to [facility] on Thursday Wound #3 Right,Anterior Lower Leg: Other: - HHRN to go see pt on Friday (tomorrow 10/27/17) and HHRN to go see pt on Tuesday (10/31/17) and then pt to return to [facility] on Thursday Carlos Roach, Carlos Roach ([id number]) Wound #4 Right,Proximal,Lateral Lower Leg: Other: - HHRN to go see pt on Friday (tomorrow 10/27/17) and HHRN  to go see pt on Tuesday (10/31/17) and then pt to return to [facility] Care Center on Thursday Wound #5 Right,Distal,Lateral Lower Leg: Other: - HHRN to go see pt on Friday (tomorrow 10/27/17) and HHRN to go see pt on Tuesday (10/31/17) and then pt to return to [facility] Center on Thursday Follow-up Appointments: Wound #1 Left,Anterior Lower Leg: Return Appointment in 1 week. Wound #2 Left,Medial Lower Leg: Return Appointment in 1 week. Wound #3 Right,Anterior Lower Leg: Return Appointment in 1 week. Wound #4 Right,Proximal,Lateral Lower Leg: Return Appointment in 1 week. Wound #5 Right,Distal,Lateral Lower Leg: Return Appointment in 1 week. Edema Control: Wound #1 Left,Anterior Lower Leg: Other: - ace wrap to bilateral legs Wound #2 Left,Medial Lower Leg: Other: - ace wrap to bilateral legs Wound #3 Right,Anterior Lower Leg: Other: - ace wrap to bilateral legs Wound #4 Right,Proximal,Lateral Lower Leg: Other: - ace wrap to bilateral legs Wound #5 Right,Distal,Lateral Lower Leg: Other: - ace wrap to bilateral legs Additional Orders / Instructions: Wound #1 Left,Anterior Lower Leg: Increase protein intake. Wound #2 Left,Medial Lower Leg: Increase protein intake. Wound #3 Right,Anterior Lower Leg: Increase protein intake. Wound #4 Right,Proximal,Lateral Lower Leg: Increase protein intake. Wound #5 Right,Distal,Lateral Lower Leg: Increase protein intake. Home Health: Wound #1 Left,Anterior Lower Leg: Continue Home Health Visits - [facility] Health Nurse may visit PRN to address patient s wound care needs. FACE TO FACE ENCOUNTER: MEDICARE and MEDICAID PATIENTS: I certify that this patient is under my care and that I had a face-to-face encounter that meets the physician face-to-face encounter requirements with this patient on this date. The encounter with the patient was in whole or in part for the following MEDICAL CONDITION: (primary reason for Home Healthcare) MEDICAL  NECESSITY: I certify, that based on my findings, NURSING services are a medically  necessary home health service. HOME BOUND STATUS: I certify that my clinical findings support that this patient is homebound (i.Carlos Roach., Due to illness or injury, pt requires aid of supportive devices such as crutches, cane, wheelchairs, walkers, the use of special transportation or the assistance of another person to leave their place of residence. There is a normal inability to leave the home and doing so requires considerable and taxing effort. Other absences are for medical reasons / religious services and are infrequent or of short duration when for other reasons). If current dressing causes regression in wound condition, may D/C ordered dressing product/s and apply Normal Saline Moist Dressing daily until next Wound Healing Center / Other MD appointment. Notify Wound Healing Center of regression in wound condition at [phone]. Please direct any NON-WOUND related issues/requests for orders to patient's Primary Care Physician Wound #2 Left,Medial Lower Leg: Continue Home Health Visits - [facility] Nurse may visit PRN to address patient s wound care needs. FACE TO FACE ENCOUNTER: MEDICARE and MEDICAID PATIENTS: I certify that this patient is under my care and that I Carlos Roach, EBRON ([id number]) had a face-to-face encounter that meets the physician face-to-face encounter requirements with this patient on this date. The encounter with the patient was in whole or in part for the following MEDICAL CONDITION: (primary reason for Home Healthcare) MEDICAL NECESSITY: I certify, that based on my findings, NURSING services are a medically necessary home health service. HOME BOUND STATUS: I certify that my clinical findings support that this patient is homebound (i.Carlos Roach., Due to illness or injury, pt requires aid of supportive devices such as crutches, cane, wheelchairs, walkers, the use of special transportation or  the assistance of another person to leave their place of residence. There is a normal inability to leave the home and doing so requires considerable and taxing effort. Other absences are for medical reasons / religious services and are infrequent or of short duration when for other reasons). If current dressing causes regression in wound condition, may D/C ordered dressing product/s and apply Normal Saline Moist Dressing daily until next Wound Healing Center / Other MD appointment. Notify Wound Healing Center of regression in wound condition at [phone]. Please direct any NON-WOUND related issues/requests for orders to patient's Primary Care Physician Wound #3 Right,Anterior Lower Leg: Continue Home Health Visits - [facility] Health Nurse may visit PRN to address patient s wound care needs. FACE TO FACE ENCOUNTER: MEDICARE and MEDICAID PATIENTS: I certify that this patient is under my care and that I had a face-to-face encounter that meets the physician face-to-face encounter requirements with this patient on this date. The encounter with the patient was in whole or in part for the following MEDICAL CONDITION: (primary reason for Home Healthcare) MEDICAL NECESSITY: I certify, that based on my findings, NURSING services are a medically necessary home health service. HOME BOUND STATUS: I certify that my clinical findings support that this patient is homebound (i.Carlos Roach., Due to illness or injury, pt requires aid of supportive devices such as crutches, cane, wheelchairs, walkers, the use of special transportation or the assistance of another person to leave their place of residence. There is a normal inability to leave the home and doing so requires considerable and taxing effort. Other absences are for medical reasons / religious services and are infrequent or of short duration when for other reasons). If current dressing causes regression in wound condition, may D/C ordered dressing product/s and  apply Normal Saline Moist Dressing daily until next Wound Healing  Center / Other MD appointment. Notify Wound Healing Center of regression in wound condition at [phone]. Please direct any NON-WOUND related issues/requests for orders to patient's Primary Care Physician Wound #4 Right,Proximal,Lateral Lower Leg: Continue Home Health Visits - [facility] Health Nurse may visit PRN to address patient s wound care needs. FACE TO FACE ENCOUNTER: MEDICARE and MEDICAID PATIENTS: I certify that this patient is under my care and that I had a face-to-face encounter that meets the physician face-to-face encounter requirements with this patient on this date. The encounter with the patient was in whole or in part for the following MEDICAL CONDITION: (primary reason for Home Healthcare) MEDICAL NECESSITY: I certify, that based on my findings, NURSING services are a medically necessary home health service. HOME BOUND STATUS: I certify that my clinical findings support that this patient is homebound (i.Carlos Roach., Due to illness or injury, pt requires aid of supportive devices such as crutches, cane, wheelchairs, walkers, the use of special transportation or the assistance of another person to leave their place of residence. There is a normal inability to leave the home and doing so requires considerable and taxing effort. Other absences are for medical reasons / religious services and are infrequent or of short duration when for other reasons). If current dressing causes regression in wound condition, may D/C ordered dressing product/s and apply Normal Saline Moist Dressing daily until next Wound Healing Center / Other MD appointment. Notify Wound Healing Center of regression in wound condition at [phone]. Please direct any NON-WOUND related issues/requests for orders to patient's Primary Care Physician Wound #5 Right,Distal,Lateral Lower Leg: Continue Home Health Visits - [facility] Health Nurse may  visit PRN to address patient s wound care needs. FACE TO FACE ENCOUNTER: MEDICARE and MEDICAID PATIENTS: I certify that this patient is under my care and that I had a face-to-face encounter that meets the physician face-to-face encounter requirements with this patient on this date. The encounter with the patient was in whole or in part for the following MEDICAL CONDITION: (primary reason for Home Healthcare) MEDICAL NECESSITY: I certify, that based on my findings, NURSING services are a medically necessary home health service. HOME BOUND STATUS: I certify that my clinical findings support that this patient is homebound (i.Carlos Roach., Due to illness or injury, pt requires aid of supportive devices such as crutches, cane, wheelchairs, walkers, the use of special transportation or the assistance of another person to leave their place of residence. There is a normal inability to leave the home and doing so requires considerable and taxing effort. Other absences are for medical reasons / religious services and are infrequent or of short duration when for other reasons). If current dressing causes regression in wound condition, may D/C ordered dressing product/s and apply Normal Saline Moist Dressing daily until next Wound Healing Center / Other MD appointment. Notify Wound Healing Center of regression in wound condition at [phone]. Please direct any NON-WOUND related issues/requests for orders to patient's Primary Care Physician RAMEEN, QUINNEY ([id number]) Services and Therapies ordered were: Venous Duplex Doppler, Arterial Studies- Bilateral - ABI The following medication(s) was prescribed: lidocaine topical 4 % cream 1 1 cream topical was prescribed at facility Electronic Signature(s) Signed: 10/26/2017 3:36:57 PM By: Bonnell Public Entered By: Bonnell Public on 10/26/2017 15:36:57 Carlos Roach ([id number]) -------------------------------------------------------------------------------- ROS/PFSH  Details Patient Name: Carlos Roach Date of Service: 10/26/2017 9:45 AM Medical Record Number: [id number] Patient Account Number: [id number] Date of Birth/Sex: 11/07/1940 (77 y.o. Male) Treating RN: Curtis Sites Primary Care Provider: Greggory Stallion,  SIONNE Other Clinician: Referring Provider: Angus PalmsGEORGE, SIONNE Treating Provider/Extender: Kathreen Cosieroulter, Nazire Fruth Weeks in Treatment: 0 Information Obtained From Patient Wound History Do you currently have one or more open woundso Yes How many open wounds do you currently haveo 6 Approximately how long have you had your woundso 3 months How have you been treating your wound(s) until nowo unna boots Has your wound(s) ever healed and then re-openedo No Have you had any lab work done in the past montho No Have you tested positive for an antibiotic resistant organism (MRSA, VRE)o No Have you tested positive for osteomyelitis (bone infection)o No Have you had any tests for circulation on your legso No Constitutional Symptoms (General Health) Complaints and Symptoms: No Complaints or Symptoms Complaints and Symptoms: Negative for: Fatigue; Fever; Chills; Marked Weight Change Eyes Complaints and Symptoms: Negative for: Dry Eyes; Vision Changes; Glasses / Contacts Medical History: Negative for: Cataracts; Glaucoma; Optic Neuritis Ear/Nose/Mouth/Throat Complaints and Symptoms: Negative for: Difficult clearing ears; Sinusitis Medical History: Negative for: Chronic sinus problems/congestion; Middle ear problems Hematologic/Lymphatic Complaints and Symptoms: Negative for: Bleeding / Clotting Disorders; Human Immunodeficiency Virus Medical History: Negative for: Anemia; Hemophilia; Human Immunodeficiency Virus; Lymphedema; Sickle Cell Disease Respiratory Complaints and Symptoms: Negative for: Chronic or frequent coughs; Shortness of Breath Carlos IrishJENKINS, Foy ([id number]) Medical History: Positive for: Sleep Apnea - CPAP Negative for: Aspiration; Asthma;  Chronic Obstructive Pulmonary Disease (COPD); Pneumothorax; Tuberculosis Cardiovascular Complaints and Symptoms: Positive for: LE edema Negative for: Chest pain Medical History: Positive for: Arrhythmia - a fib; Congestive Heart Failure; Hypertension; Peripheral Venous Disease Negative for: Angina; Coronary Artery Disease; Deep Vein Thrombosis; Hypotension; Myocardial Infarction; Peripheral Arterial Disease Gastrointestinal Complaints and Symptoms: Negative for: Frequent diarrhea; Nausea; Vomiting Medical History: Negative for: Cirrhosis ; Colitis; Crohnos; Hepatitis A; Hepatitis B; Hepatitis C Past Medical History Notes: hx GI bleed, ETOH use Endocrine Complaints and Symptoms: Negative for: Hepatitis; Thyroid disease; Polydypsia (Excessive Thirst) Medical History: Negative for: Type I Diabetes; Type II Diabetes Genitourinary Complaints and Symptoms: Negative for: Kidney failure/ Dialysis; Incontinence/dribbling Medical History: Negative for: End Stage Renal Disease Immunological Complaints and Symptoms: Negative for: Hives; Itching Medical History: Negative for: Lupus Erythematosus; Raynaudos; Scleroderma Integumentary (Skin) Complaints and Symptoms: Positive for: Wounds; Swelling Negative for: Bleeding or bruising tendency; Breakdown Medical History: Negative for: History of Burn; History of pressure wounds Musculoskeletal Carlos IrishJENKINS, Tywone ([id number]) Complaints and Symptoms: Negative for: Muscle Pain; Muscle Weakness Medical History: Negative for: Gout; Rheumatoid Arthritis; Osteoarthritis; Osteomyelitis Neurologic Complaints and Symptoms: Negative for: Numbness/parasthesias; Focal/Weakness Medical History: Negative for: Dementia; Neuropathy; Quadriplegia; Paraplegia; Seizure Disorder Psychiatric Complaints and Symptoms: Negative for: Anxiety; Claustrophobia Medical History: Negative for: Anorexia/bulimia; Confinement Anxiety Oncologic Medical  History: Negative for: Received Chemotherapy; Received Radiation Immunizations Pneumococcal Vaccine: Received Pneumococcal Vaccination: Yes Implantable Devices Family and Social History Cancer: Yes - Siblings; Diabetes: No; Heart Disease: Yes - Father; Hereditary Spherocytosis: No; Hypertension: Yes - Father; Kidney Disease: No; Lung Disease: No; Seizures: No; Stroke: No; Thyroid Problems: No; Tuberculosis: No; Never smoker; Marital Status - Married; Alcohol Use: Never - quit in January; Drug Use: No History; Caffeine Use: Daily; Financial Concerns: No; Food, Clothing or Shelter Needs: No; Support System Lacking: No; Transportation Concerns: No; Advanced Directives: No; Patient does not want information on Advanced Directives Electronic Signature(s) Signed: 10/26/2017 3:30:12 PM By: Curtis Sitesorthy, Joanna Signed: 10/26/2017 4:35:32 PM By: Bonnell Publicoulter, Monika Chestang Entered By: Curtis Sitesorthy, Joanna on 10/26/2017 10:02:08 Carlos IrishJENKINS, Johnnathan ([id number]) -------------------------------------------------------------------------------- SuperBill Details Patient Name: Carlos IrishJENKINS, Balian Date of Service: 10/26/2017 Medical Record Number: [id number] Patient Account Number: [id number]  Date of Birth/Sex: September 20, 1940 (77 y.o. 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 breakdown of skin R60.9 Edema, unspecified Facility Procedures CPT4 Code: [id number] Description: [phone] - WOUND CARE VISIT-LEV 5 EST PT Modifier: Quantity: 1 Physician Procedures CPT4 Code:  [id number] Description: WC PHYS LEVEL 3 o NEW PT ICD-10 Diagnosis Description L97.212 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

## 2017-10-27 NOTE — Progress Notes (Signed)
Carlos Roach, Cash ([id number]) Visit Report for 10/26/2017 Abuse/Suicide Risk Screen Details Patient Name: Carlos Roach, Briant Date of Service: 10/26/2017 9:45 AM Medical Record Number: [id number] Patient Account Number: [id number] Date of Birth/Sex: 1940/12/30 30(76 y.o. Male) Treating RN: Curtis Sitesorthy, Joanna Primary Care Welcome Fults: Angus PalmsGEORGE, SIONNE Other Clinician: Referring Freddye Cardamone: Angus PalmsGEORGE, SIONNE Treating Luv Mish/Extender: Kathreen Cosieroulter, Leah Weeks in Treatment: 0 Abuse/Suicide Risk Screen Items Answer ABUSE/SUICIDE RISK SCREEN: Has anyone close to you tried to hurt or harm you recentlyo No Do you feel uncomfortable with anyone in your familyo No Has anyone forced you do things that you didnot want to doo No Do you have any thoughts of harming yourselfo No Patient displays signs or symptoms of abuse and/or neglect. No Electronic Signature(s) Signed: 10/26/2017 3:30:12 PM By: Curtis Sitesorthy, Joanna Entered By: Curtis Sitesorthy, Joanna on 10/26/2017 09:52:04 Carlos Roach, Diontae ([id number]) -------------------------------------------------------------------------------- Activities of Daily Living Details Patient Name: Carlos Roach, Aston Date of Service: 10/26/2017 9:45 AM Medical Record Number: [id number] Patient Account Number: [id number] Date of Birth/Sex: 1940/12/30 28(76 y.o. Male) Treating RN: Curtis Sitesorthy, Joanna Primary Care Maranatha Grossi: Angus PalmsGEORGE, SIONNE Other Clinician: Referring Xandra Laramee: Angus PalmsGEORGE, SIONNE Treating Shakerria Parran/Extender: Kathreen Cosieroulter, Leah Weeks in Treatment: 0 Activities of Daily Living Items Answer Activities of Daily Living (Please select one for each item) Drive Automobile Completely Able Take Medications Completely Able Use Telephone Completely Able Care for Appearance Completely Able Use Toilet Completely Able Bath / Shower Completely Able Dress Self Completely Able Feed Self Completely Able Walk Completely Able Get In / Out Bed Completely Able Housework Completely Able Prepare Meals Completely Able Handle  Money Completely Able Shop for Self Completely Able Electronic Signature(s) Signed: 10/26/2017 3:30:12 PM By: Curtis Sitesorthy, Joanna Entered By: Curtis Sitesorthy, Joanna on 10/26/2017 09:52:29 Carlos Roach, Nana ([id number]) -------------------------------------------------------------------------------- Education Assessment Details Patient Name: Carlos Roach, Emmanual Date of Service: 10/26/2017 9:45 AM Medical Record Number: [id number] Patient Account Number: [id number] Date of Birth/Sex: 1940/12/30 22(76 y.o. Male) Treating RN: Curtis Sitesorthy, Joanna Primary Care Deverick Pruss: Angus PalmsGEORGE, SIONNE Other Clinician: Referring Alyn Riedinger: Angus PalmsGEORGE, SIONNE Treating Deloros Beretta/Extender: Kathreen Cosieroulter, Leah Weeks in Treatment: 0 Primary Learner Assessed: Caregiver HHRN Reason Patient is not Primary Learner: wound location Learning Preferences/Education Level/Primary Language Learning Preference: Explanation, Demonstration Highest Education Level: High School Preferred Language: English Cognitive Barrier Assessment/Beliefs Language Barrier: No Translator Needed: No Memory Deficit: No Emotional Barrier: No Cultural/Religious Beliefs Affecting Medical Care: No Physical Barrier Assessment Impaired Vision: No Impaired Hearing: No Decreased Hand dexterity: No Knowledge/Comprehension Assessment Knowledge Level: Medium Comprehension Level: Medium Ability to understand written Medium instructions: Ability to understand verbal Medium instructions: Motivation Assessment Anxiety Level: Calm Cooperation: Cooperative Education Importance: Acknowledges Need Interest in Health Problems: Asks Questions Perception: Coherent Willingness to Engage in Self- Medium Management Activities: Readiness to Engage in Self- Medium Management Activities: Electronic Signature(s) Signed: 10/26/2017 3:30:12 PM By: Curtis Sitesorthy, Joanna Entered By: Curtis Sitesorthy, Joanna on 10/26/2017 09:52:59 Carlos Roach, Nevin  ([id number]) -------------------------------------------------------------------------------- Fall Risk Assessment Details Patient Name: Carlos Roach, Quinton Date of Service: 10/26/2017 9:45 AM Medical Record Number: [id number] Patient Account Number: [id number] Date of Birth/Sex: 1940/12/30 5(76 y.o. Male) Treating RN: Curtis Sitesorthy, Joanna Primary Care Yasseen Salls: Angus PalmsGEORGE, SIONNE Other Clinician: Referring Zyree Traynham: Angus PalmsGEORGE, SIONNE Treating Nashton Belson/Extender: Kathreen Cosieroulter, Leah Weeks in Treatment: 0 Fall Risk Assessment Items Have you had 2 or more falls in the last 12 monthso 0 No Have you had any fall that resulted in injury in the last 12 monthso 0 No FALL RISK ASSESSMENT: History of falling - immediate or within 3 months 0 No Secondary diagnosis 0 No Ambulatory aid None/bed rest/wheelchair/nurse 0 No Crutches/cane/walker 15 Yes Furniture 0 No IV Access/Saline  Lock 0 No Gait/Training Normal/bed rest/immobile 0 No Weak 10 Yes Impaired 0 No Mental Status Oriented to own ability 0 Yes Electronic Signature(s) Signed: 10/26/2017 3:30:12 PM By: Curtis Sites Entered By: Curtis Sites on 10/26/2017 09:53:34 Carlos Irish ([id number]) -------------------------------------------------------------------------------- Nutrition Risk Assessment Details Patient Name: Carlos Irish Date of Service: 10/26/2017 9:45 AM Medical Record Number: [id number] Patient Account Number: [id number] Date of Birth/Sex: 11-19-1940 (77 y.o. Male) Treating RN: Curtis Sites Primary Care Catina Nuss: Angus Palms Other Clinician: Referring Nina Hoar: Angus Palms Treating Pinchas Reither/Extender: Bonnell Public Weeks in Treatment: 0 Height (in): Weight (lbs): Body Mass Index (BMI): Nutrition Risk Assessment Items NUTRITION RISK SCREEN: I have an illness or condition that made me change the kind and/or amount of 0 No food I eat I eat fewer than two meals per day 0 No I eat few fruits and vegetables, or milk products 0  No I have three or more drinks of beer, liquor or wine almost every day 0 No I have tooth or mouth problems that make it hard for me to eat 0 No I don't always have enough money to buy the food I need 0 No I eat alone most of the time 0 No I take three or more different prescribed or over-the-counter drugs a day 1 Yes Without wanting to, I have lost or gained 10 pounds in the last six months 0 No I am not always physically able to shop, cook and/or feed myself 0 No Nutrition Protocols Good Risk Protocol 0 No interventions needed Moderate Risk Protocol Electronic Signature(s) Signed: 10/26/2017 3:30:12 PM By: Curtis Sites Entered By: Curtis Sites on 10/26/2017 09:53:45

## 2017-10-27 NOTE — Progress Notes (Signed)
LAUREANO, HETZER ([id number]) Visit Report for 10/26/2017 Allergy List Details Patient Name: CHRSITOPHER, WIK Date of Service: 10/26/2017 9:45 AM Medical Record Number: [id number] Patient Account Number: [id number] Date of Birth/Sex: [birth year] (77 y.o. Male) Treating RN: Curtis Sites Primary Care Armari Fussell: Angus Palms Other Clinician: Referring Ericson Nafziger: Angus Palms Treating Eleuterio Dollar/Extender: Bonnell Public Weeks in Treatment: 0 Allergies Active Allergies Mucinex Allergy Notes Electronic Signature(s) Signed: 10/26/2017 3:30:12 PM By: Curtis Sites Entered By: Curtis Sites on 10/26/2017 09:51:55 [name] ([id number]) -------------------------------------------------------------------------------- Arrival Information Details Patient Name: [name] Date of Service: 10/26/2017 9:45 AM Medical Record Number: [id number] Patient Account Number: [id number] Date of Birth/Sex: March 31, 1941 (76 y.o. Male) Treating RN: Curtis Sites Primary Care Yanky Vanderburg: Angus Palms Other Clinician: Referring Thaxton Pelley: Angus Palms Treating Anais Denslow/Extender: Kathreen Cosier in Treatment: 0 Visit Information Patient Arrived: Ambulatory Arrival Time: 09:50 Accompanied By: self Transfer Assistance: None Patient Identification Verified: Yes Secondary Verification Process Yes Completed: Patient Has Alerts: Yes Patient Alerts: Patient on Blood Thinner Eliquis Electronic Signature(s) Signed: 10/26/2017 3:30:12 PM By: Curtis Sites Entered By: Curtis Sites on 10/26/2017 09:51:14 [name] ([id number]) -------------------------------------------------------------------------------- Clinic Level of Care Assessment Details Patient Name: [name] Date of Service: 10/26/2017 9:45 AM Medical Record Number: [id number] Patient Account Number: [id number] Date of Birth/Sex: [birth year] (77 y.o. Male) Treating RN: Phillis Haggis Primary Care Tedric Leeth: Angus Palms  Other Clinician: Referring Ernestine Rohman: Angus Palms Treating Madell Heino/Extender: Kathreen Cosier in Treatment: 0 Clinic Level of Care Assessment Items TOOL 2 Quantity Score X - Use when only an EandM is performed on the INITIAL visit 1 0 ASSESSMENTS - Nursing Assessment / Reassessment X - General Physical Exam (combine w/ comprehensive assessment (listed just below) when 1 20 performed on new pt. evals) X- 1 25 Comprehensive Assessment (HX, ROS, Risk Assessments, Wounds Hx, etc.) ASSESSMENTS - Wound and Skin Assessment / Reassessment []  - Simple Wound Assessment / Reassessment - one wound 0 X- 5 5 Complex Wound Assessment / Reassessment - multiple wounds []  - 0 Dermatologic / Skin Assessment (not related to wound area) ASSESSMENTS - Ostomy and/or Continence Assessment and Care []  - Incontinence Assessment and Management 0 []  - 0 Ostomy Care Assessment and Management (repouching, etc.) PROCESS - Coordination of Care X - Simple Patient / Family Education for ongoing care 1 15 []  - 0 Complex (extensive) Patient / Family Education for ongoing care []  - 0 Staff obtains Chiropractor, Records, Test Results / Process Orders []  - 0 Staff telephones HHA, Nursing Homes / Clarify orders / etc []  - 0 Routine Transfer to another Facility (non-emergent condition) []  - 0 Routine Hospital Admission (non-emergent condition) []  - 0 New Admissions / Manufacturing engineer / Ordering NPWT, Apligraf, etc. []  - 0 Emergency Hospital Admission (emergent condition) X- 1 10 Simple Discharge Coordination []  - 0 Complex (extensive) Discharge Coordination PROCESS - Special Needs []  - Pediatric / Minor Patient Management 0 []  - 0 Isolation Patient Management KO, BARDON ([id number]) []  - 0 Hearing / Language / Visual special needs []  - 0 Assessment of Community assistance (transportation, D/C planning, etc.) []  - 0 Additional assistance / Altered mentation []  - 0 Support Surface(s)  Assessment (bed, cushion, seat, etc.) INTERVENTIONS - Wound Cleansing / Measurement X - Wound Imaging (photographs - any number of wounds) 1 5 []  - 0 Wound Tracing (instead of photographs) []  - 0 Simple Wound Measurement - one wound X- 5 5 Complex Wound Measurement - multiple wounds []  - 0 Simple Wound Cleansing - one wound X- 5 5 Complex Wound Cleansing -  multiple wounds INTERVENTIONS - Wound Dressings []  - Small Wound Dressing one or multiple wounds 0 X- 2 15 Medium Wound Dressing one or multiple wounds []  - 0 Large Wound Dressing one or multiple wounds []  - 0 Application of Medications - injection INTERVENTIONS - Miscellaneous []  - External ear exam 0 []  - 0 Specimen Collection (cultures, biopsies, blood, body fluids, etc.) []  - 0 Specimen(s) / Culture(s) sent or taken to Lab for analysis []  - 0 Patient Transfer (multiple staff / Michiel Sites Lift / Similar devices) []  - 0 Simple Staple / Suture removal (25 or less) []  - 0 Complex Staple / Suture removal (26 or more) []  - 0 Hypo / Hyperglycemic Management (close monitor of Blood Glucose) X- 1 15 Ankle / Brachial Index (ABI) - do not check if billed separately Has the patient been seen at the hospital within the last three years: Yes Total Score: 195 Level Of Care: New/Established - Level 5 Electronic Signature(s) Signed: 10/26/2017 3:48:25 PM By: Alejandro Mulling Entered By: Alejandro Mulling on 10/26/2017 15:27:01 [name] ([id number]) -------------------------------------------------------------------------------- Encounter Discharge Information Details Patient Name: [name] Date of Service: 10/26/2017 9:45 AM Medical Record Number: [id number] Patient Account Number: [id number] Date of Birth/Sex: April 25, 1940 (77 y.o. Male) Treating RN: Renne Crigler Primary Care Fae Blossom: Angus Palms Other Clinician: Referring Iszabella Hebenstreit: Angus Palms Treating Irish Breisch/Extender: Kathreen Cosier in Treatment:  0 Encounter Discharge Information Items Discharge Condition: Stable Ambulatory Status: Ambulatory Discharge Destination: Home Transportation: Private Auto Schedule Follow-up Appointment: Yes Clinical Summary of Care: Electronic Signature(s) Signed: 10/26/2017 3:39:18 PM By: Renne Crigler Entered By: Renne Crigler on 10/26/2017 11:26:56 [name] ([id number]) -------------------------------------------------------------------------------- Lower Extremity Assessment Details Patient Name: [name] Date of Service: 10/26/2017 9:45 AM Medical Record Number: [id number] Patient Account Number: [id number] Date of Birth/Sex: 1941/02/27 (76 y.o. Male) Treating RN: Curtis Sites Primary Care Deira Shimer: Angus Palms Other Clinician: Referring Francella Barnett: Angus Palms Treating Ursula Dermody/Extender: Bonnell Public Weeks in Treatment: 0 Edema Assessment Assessed: [Left: No] [Right: No] Edema: [Left: Yes] [Right: Yes] Calf Left: Right: Point of Measurement: 34 cm From Medial Instep 39.2 cm 38 cm Ankle Left: Right: Point of Measurement: 14 cm From Medial Instep 25.5 cm 26.4 cm Vascular Assessment Pulses: Dorsalis Pedis Palpable: [Left:Yes] [Right:Yes] Doppler Audible: [Left:Yes] [Right:Yes] Posterior Tibial Palpable: [Left:No] [Right:No] Doppler Audible: [Left:Yes] [Right:Yes] Extremity colors, hair growth, and conditions: Extremity Color: [Left:Hyperpigmented] [Right:Hyperpigmented] Hair Growth on Extremity: [Left:No] [Right:No] Temperature of Extremity: [Left:Cool] [Right:Cool] Capillary Refill: [Left:< 3 seconds] [Right:< 3 seconds] Blood Pressure: Brachial: [Right:134] Dorsalis Pedis: [Left:Dorsalis Pedis: 190] Ankle: Posterior Tibial: [Left:Posterior Tibial: 196] [Right:1.46] Toe Nail Assessment Left: Right: Thick: Yes Yes Discolored: Yes Yes Deformed: Yes Yes Improper Length and Hygiene: No No Notes ABI [employer] L >220 Electronic Signature(s) Signed:  10/26/2017 3:30:12 PM By: Mariea Clonts, Gerlene Burdock ([id number]) Entered By: Curtis Sites on 10/26/2017 10:34:23 [name] ([id number]) -------------------------------------------------------------------------------- Multi Wound Chart Details Patient Name: [name] Date of Service: 10/26/2017 9:45 AM Medical Record Number: [id number] Patient Account Number: [id number] Date of Birth/Sex: [birth year] (77 y.o. 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 Comorbid History: Sleep Apnea,  Arrhythmia, Sleep Apnea, Arrhythmia, Sleep Apnea, Arrhythmia, Congestive Heart Failure, Congestive Heart Failure, Congestive Heart Failure, Hypertension, Peripheral Hypertension, Peripheral Hypertension, Peripheral Venous Disease Venous Disease Venous Disease Date Acquired: 08/14/2017 08/14/2017 08/14/2017 Weeks of Treatment: 0 0 0 Wound Status: Open Open Open Clustered Wound: No No No Clustered Quantity: N/A N/A N/A Measurements L x W x D 1.4x0.7x0.1 1.3x0.3x0.1 3.7x2.5x0.1 (cm) Area (cm) : 0.77 0.306 7.265 Volume (cm) : 0.077 0.031 0.726 Classification: Full Thickness Without Full Thickness Without Full Thickness Without Exposed Support Structures Exposed Support Structures Exposed Support Structures Exudate Amount: Large Large Large Exudate Type: Serous Sanguinous Sanguinous Exudate Color: amber red red Wound Margin: Flat and Intact Flat and Intact Flat and  Intact Granulation Amount: Large (67-100%) Large (67-100%) Large (67-100%) Granulation Quality: Red Red Red Necrotic Amount: Small (1-33%) None Present (0%) Small (1-33%) Exposed Structures: Fat Layer (Subcutaneous Fat Layer (Subcutaneous Fat Layer (Subcutaneous Tissue) Exposed: Yes Tissue) Exposed: Yes Tissue) Exposed: Yes Fascia: No Fascia: No Fascia: No Tendon: No Tendon: No Tendon: No HARM, JOU ([id number]) Muscle: No Muscle: No Muscle: No Joint: No Joint: No Joint: No Bone: No Bone: No Bone: No Epithelialization: None Small (1-33%) None Periwound Skin Texture: Excoriation: No Excoriation: No Excoriation: No Induration: No Induration: No Induration: No Callus: No Callus: No Callus: No Crepitus: No Crepitus: No Crepitus: No Rash: No Rash: No Rash: No Scarring: No Scarring: No Scarring: No Periwound Skin Moisture: Maceration: No Maceration: No Maceration: No Dry/Scaly: No Dry/Scaly: No Dry/Scaly: No Periwound Skin Color: Hemosiderin Staining: Yes Hemosiderin Staining: Yes Hemosiderin Staining: Yes Atrophie Blanche: No Atrophie Blanche: No Atrophie Blanche: No Cyanosis: No Cyanosis: No Cyanosis: No Ecchymosis: No Ecchymosis: No Ecchymosis: No Erythema: No Erythema: No Erythema: No Mottled: No Mottled: No Mottled: No Pallor: No Pallor: No Pallor: No Rubor: No Rubor: No Rubor: No Temperature: No Abnormality No Abnormality No Abnormality Tenderness on Palpation: Yes Yes Yes Wound Preparation: Ulcer Cleansing: Ulcer Cleansing: Ulcer Cleansing: Rinsed/Irrigated with Saline Rinsed/Irrigated with Saline Rinsed/Irrigated with Saline Topical Anesthetic Applied: Topical Anesthetic Applied: Topical Anesthetic Applied: Other: lidocaine 4% Other: lidocaine 4% Other: lidocaine 4% Wound Number: 4 5 N/A Photos: N/A Wound Location: Right Lower Leg - Lateral, Right Lower Leg - Lateral, N/A Proximal Distal Wounding Event: Blister Blister  N/A Primary Etiology: Venous Leg Ulcer Venous Leg Ulcer N/A Comorbid History: Sleep Apnea, Arrhythmia, Sleep Apnea, Arrhythmia, N/A Congestive Heart Failure, Congestive Heart Failure, Hypertension, Peripheral Hypertension, Peripheral Venous Disease Venous Disease Date Acquired: 08/14/2017 08/14/2017 N/A Weeks of Treatment: 0 0 N/A Wound Status: Open Open N/A Clustered Wound: Yes Yes N/A Clustered Quantity: 2 N/A N/A Measurements L x W x D 3.5x0.5x0.1 3.6x0.6x0.1 N/A (cm) Area (cm) : 1.374 1.696 N/A Volume (cm) : 0.137 0.17 N/A Classification: Full Thickness Without Full Thickness Without N/A Exposed Support Structures Exposed Support Structures Exudate Amount: Large Large N/A Exudate Type: Sanguinous Sanguinous N/A XUE, LOW ([id number]) Exudate Color: red red N/A Wound Margin: Flat and Intact Flat and Intact N/A Granulation Amount: Large (67-100%) Large (67-100%) N/A Granulation Quality: Red Red N/A Necrotic Amount: Small (1-33%) Small (1-33%) N/A Exposed Structures: Fat Layer (Subcutaneous Fat Layer (Subcutaneous N/A Tissue) Exposed: Yes Tissue) Exposed: Yes Fascia: No Fascia: No Tendon: No Tendon: No Muscle: No Muscle: No Joint: No Joint: No Bone: No Bone: No Epithelialization: Small (1-33%) Small (1-33%) N/A Periwound Skin Texture: Excoriation: No Excoriation: No N/A Induration: No Induration: No Callus: No Callus: No Crepitus: No Crepitus: No Rash: No Rash: No Scarring: No Scarring: No Periwound Skin  Moisture: Maceration: No Maceration: No N/A Dry/Scaly: No Dry/Scaly: No Periwound Skin Color: Hemosiderin Staining: Yes Hemosiderin Staining: Yes N/A Atrophie Blanche: No Atrophie Blanche: No Cyanosis: No Cyanosis: No Ecchymosis: No Ecchymosis: No Erythema: No Erythema: No Mottled: No Mottled: No Pallor: No Pallor: No Rubor: No Rubor: No Temperature: No Abnormality No Abnormality N/A Tenderness on Palpation: Yes Yes N/A Wound  Preparation: Ulcer Cleansing: Ulcer Cleansing: N/A Rinsed/Irrigated with Saline Rinsed/Irrigated with Saline Topical Anesthetic Applied: Topical Anesthetic Applied: Other: lidocaine 4% Other: lidocaine 4% Treatment Notes Wound #1 (Left, Anterior Lower Leg) 1. Cleansed with: Clean wound with Normal Saline 2. Anesthetic Topical Lidocaine 4% cream to wound bed prior to debridement 4. Dressing Applied: Hydrafera Blue Mepitel 5. Secondary Dressing Applied ABD Pad Notes kerlix wrap bilateral with ace wrap Wound #2 (Left, Medial Lower Leg) 1. Cleansed with: Clean wound with Normal Saline 2. Anesthetic Buena IrishJENKINS, Shariq ([id number]) Topical Lidocaine 4% cream to wound bed prior to debridement 4. Dressing Applied: Hydrafera Blue Mepitel 5. Secondary Dressing Applied ABD Pad Notes kerlix wrap bilateral with ace wrap Wound #3 (Right, Anterior Lower Leg) 1. Cleansed with: Clean wound with Normal Saline 2. Anesthetic Topical Lidocaine 4% cream to wound bed prior to debridement 4. Dressing Applied: Hydrafera Blue Mepitel 5. Secondary Dressing Applied ABD Pad Notes kerlix wrap bilateral with ace wrap Wound #4 (Right, Proximal, Lateral Lower Leg) 1. Cleansed with: Clean wound with Normal Saline 2. Anesthetic Topical Lidocaine 4% cream to wound bed prior to debridement 4. Dressing Applied: Hydrafera Blue Mepitel 5. Secondary Dressing Applied ABD Pad Notes kerlix wrap bilateral with ace wrap Wound #5 (Right, Distal, Lateral Lower Leg) 1. Cleansed with: Clean wound with Normal Saline 2. Anesthetic Topical Lidocaine 4% cream to wound bed prior to debridement 4. Dressing Applied: Hydrafera Blue Mepitel 5. Secondary Dressing Applied ABD Pad Notes kerlix wrap bilateral with ace wrap Electronic Signature(s) Buena IrishJENKINS, Samin ([id number]) Signed: 10/26/2017 3:25:08 PM By: Bonnell Publicoulter, Leah Entered By: Bonnell Publicoulter, Leah on 10/26/2017 15:25:07 Buena IrishJENKINS, Naomi  ([id number]) -------------------------------------------------------------------------------- Multi-Disciplinary Care Plan Details Patient Name: Buena IrishJENKINS, Gustave Date of Service: 10/26/2017 9:45 AM Medical Record Number: [id number] Patient Account Number: [id number] Date of Birth/Sex: March 18, 1941 [age] y.o. Male) Treating RN: Phillis HaggisPinkerton, Debi Primary Care Demiah Gullickson: Angus PalmsGEORGE, SIONNE Other Clinician: Referring Taurus Willis: Angus PalmsGEORGE, SIONNE Treating Aviendha Azbell/Extender: Kathreen Cosieroulter, Leah Weeks in Treatment: 0 Active Inactive ` Abuse / Safety / Falls / Self Care Management Nursing Diagnoses: Potential for falls Goals: Patient will not experience any injury related to falls Date Initiated: 10/26/2017 Target Resolution Date: 02/17/2018 Goal Status: Active Interventions: Assess Activities of Daily Living upon admission and as needed Assess fall risk on admission and as needed Assess: immobility, friction, shearing, incontinence upon admission and as needed Assess impairment of mobility on admission and as needed per policy Notes: ` Nutrition Nursing Diagnoses: Imbalanced nutrition Potential for alteratiion in Nutrition/Potential for imbalanced nutrition Goals: Patient/caregiver agrees to and verbalizes understanding of need to use nutritional supplements and/or vitamins as prescribed Date Initiated: 10/26/2017 Target Resolution Date: 02/17/2018 Goal Status: Active Interventions: Assess patient nutrition upon admission and as needed per policy Notes: ` Orientation to the Wound Care Program Nursing Diagnoses: Knowledge deficit related to the wound healing center program Goals: Patient/caregiver will verbalize understanding of the Wound Healing Center Program Buena IrishJENKINS, Mcguire ([id number]) Date Initiated: 10/26/2017 Target Resolution Date: 11/18/2017 Goal Status: Active Interventions: Provide education on orientation to the wound center Notes: ` Wound/Skin Impairment Nursing Diagnoses: Impaired  tissue integrity Knowledge deficit related to ulceration/compromised skin integrity Goals: Ulcer/skin breakdown will have  a volume reduction of 80% by week 12 Date Initiated: 10/26/2017 Target Resolution Date: 02/17/2018 Goal Status: Active Interventions: Assess patient/caregiver ability to perform ulcer/skin care regimen upon admission and as needed Assess ulceration(s) every visit Notes: Electronic Signature(s) Signed: 10/26/2017 3:48:25 PM By: Alejandro Mulling Entered By: Alejandro Mulling on 10/26/2017 11:00:31 [name] ([id number]) -------------------------------------------------------------------------------- Pain Assessment Details Patient Name: [name] Date of Service: 10/26/2017 9:45 AM Medical Record Number: [id number] Patient Account Number: [id number] Date of Birth/Sex: 09/27/1940 (77 y.o. Male) Treating RN: Curtis Sites Primary Care Jadda Hunsucker: Angus Palms Other Clinician: Referring Zarian Colpitts: Angus Palms Treating Vladimir Lenhoff/Extender: Kathreen Cosier in Treatment: 0 Active Problems Location of Pain Severity and Description of Pain Patient Has Paino Yes Site Locations Pain Location: Pain in Ulcers With Dressing Change: Yes Duration of the Pain. Constant / Intermittento Intermittent Pain Management and Medication Current Pain Management: Electronic Signature(s) Signed: 10/26/2017 3:30:12 PM By: Curtis Sites Entered By: Curtis Sites on 10/26/2017 09:51:35 [name] ([id number]) -------------------------------------------------------------------------------- Patient/Caregiver Education Details Patient Name: [name] Date of Service: 10/26/2017 9:45 AM Medical Record Number: [id number] Patient Account Number: [id number] Date of Birth/Gender: 04-14-1940 (77 y.o. Male) Treating RN: Renne Crigler Primary Care Physician: Angus Palms Other Clinician: Referring Physician: Angus Palms Treating Physician/Extender: Kathreen Cosier in Treatment: 0 Education Assessment Education Provided To: Patient Education Topics Provided Wound Debridement: Handouts: Wound Debridement Methods: Explain/Verbal Responses: State content correctly Wound/Skin Impairment: Handouts: Caring for Your Ulcer Methods: Explain/Verbal Responses: State content correctly Electronic Signature(s) Signed: 10/26/2017 3:39:18 PM By: Renne Crigler Entered By: Renne Crigler on 10/26/2017 11:27:15 [name] ([id number]) -------------------------------------------------------------------------------- Wound Assessment Details Patient Name: [name] Date of Service: 10/26/2017 9:45 AM Medical Record Number: [id number] Patient Account Number: [id number] Date of Birth/Sex: January 18, 1941 (77 y.o. Male) Treating RN: Curtis Sites Primary Care Ledarius Leeson: Angus Palms Other Clinician: Referring Eulogia Dismore: Angus Palms Treating Kendelle Schweers/Extender: Bonnell Public Weeks in Treatment: 0 Wound Status Wound Number: 1 Primary Venous Leg Ulcer Etiology: Wound Location: Left Lower Leg - Anterior Wound Open Wounding Event: Blister Status: Date Acquired: 08/14/2017 Comorbid Sleep Apnea, Arrhythmia, Congestive Heart Weeks Of Treatment: 0 History: Failure, Hypertension, Peripheral Venous Clustered Wound: No Disease Photos Photo Uploaded By: Curtis Sites on 10/26/2017 15:06:51 Wound Measurements Length: (cm) 1.4 Width: (cm) 0.7 Depth: (cm) 0.1 Area: (cm) 0.77 Volume: (cm) 0.077 % Reduction in Area: % Reduction in Volume: Epithelialization: None Tunneling: No Undermining: No Wound Description Full Thickness Without Exposed Support Classification: Structures Wound Margin: Flat and Intact Exudate Large Amount: Exudate Type: Serous Exudate Color: amber Foul Odor After Cleansing: No Slough/Fibrino Yes Wound Bed Granulation Amount: Large (67-100%) Exposed Structure Granulation Quality: Red Fascia Exposed:  No Necrotic Amount: Small (1-33%) Fat Layer (Subcutaneous Tissue) Exposed: Yes Necrotic Quality: Adherent Slough Tendon Exposed: No Muscle Exposed: No Joint Exposed: No Bone Exposed: No ETHERIDGE, GEIL ([id number]) Periwound Skin Texture Texture Color No Abnormalities Noted: No No Abnormalities Noted: No Callus: No Atrophie Blanche: No Crepitus: No Cyanosis: No Excoriation: No Ecchymosis: No Induration: No Erythema: No Rash: No Hemosiderin Staining: Yes Scarring: No Mottled: No Pallor: No Moisture Rubor: No No Abnormalities Noted: No Dry / Scaly: No Temperature / Pain Maceration: No Temperature: No Abnormality Tenderness on Palpation: Yes Wound Preparation Ulcer Cleansing: Rinsed/Irrigated with Saline Topical Anesthetic Applied: Other: lidocaine 4%, Treatment Notes Wound #1 (Left, Anterior Lower Leg) 1. Cleansed with: Clean wound with Normal Saline 2. Anesthetic Topical Lidocaine 4% cream to wound bed prior to debridement 4. Dressing Applied: Hydrafera Blue Mepitel 5. Secondary Dressing Applied ABD Pad  Notes kerlix wrap bilateral with ace wrap Electronic Signature(s) Signed: 10/26/2017 3:30:12 PM By: Curtis Sites Entered By: Curtis Sites on 10/26/2017 10:19:34 [name] ([id number]) -------------------------------------------------------------------------------- Wound Assessment Details Patient Name: [name] Date of Service: 10/26/2017 9:45 AM Medical Record Number: [id number] Patient Account Number: [id number] Date of Birth/Sex: [birth year] (77 y.o. Male) Treating RN: Curtis Sites Primary Care Wesson Stith: Angus Palms Other Clinician: Referring Glendon Dunwoody: Angus Palms Treating Mardell Suttles/Extender: Kathreen Cosier in Treatment: 0 Wound Status Wound Number: 2 Primary Venous Leg Ulcer Etiology: Wound Location: Left Lower Leg - Medial Wound Open Wounding Event: Blister Status: Date Acquired: 08/14/2017 Comorbid Sleep Apnea,  Arrhythmia, Congestive Heart Weeks Of Treatment: 0 History: Failure, Hypertension, Peripheral Venous Clustered Wound: No Disease Photos Photo Uploaded By: Curtis Sites on 10/26/2017 15:06:52 Wound Measurements Length: (cm) 1.3 Width: (cm) 0.3 Depth: (cm) 0.1 Area: (cm) 0.306 Volume: (cm) 0.031 % Reduction in Area: % Reduction in Volume: Epithelialization: Small (1-33%) Tunneling: No Undermining: No Wound Description Full Thickness Without Exposed Support Classification: Structures Wound Margin: Flat and Intact Exudate Large Amount: Exudate Type: Sanguinous Exudate Color: red Foul Odor After Cleansing: No Slough/Fibrino No Wound Bed Granulation Amount: Large (67-100%) Exposed Structure Granulation Quality: Red Fascia Exposed: No Necrotic Amount: None Present (0%) Fat Layer (Subcutaneous Tissue) Exposed: Yes Tendon Exposed: No Muscle Exposed: No Joint Exposed: No Bone Exposed: No AMALIO, LOE ([id number]) Periwound Skin Texture Texture Color No Abnormalities Noted: No No Abnormalities Noted: No Callus: No Atrophie Blanche: No Crepitus: No Cyanosis: No Excoriation: No Ecchymosis: No Induration: No Erythema: No Rash: No Hemosiderin Staining: Yes Scarring: No Mottled: No Pallor: No Moisture Rubor: No No Abnormalities Noted: No Dry / Scaly: No Temperature / Pain Maceration: No Temperature: No Abnormality Tenderness on Palpation: Yes Wound Preparation Ulcer Cleansing: Rinsed/Irrigated with Saline Topical Anesthetic Applied: Other: lidocaine 4%, Treatment Notes Wound #2 (Left, Medial Lower Leg) 1. Cleansed with: Clean wound with Normal Saline 2. Anesthetic Topical Lidocaine 4% cream to wound bed prior to debridement 4. Dressing Applied: Hydrafera Blue Mepitel 5. Secondary Dressing Applied ABD Pad Notes kerlix wrap bilateral with ace wrap Electronic Signature(s) Signed: 10/26/2017 3:30:12 PM By: Curtis Sites Entered By: Curtis Sites on 10/26/2017 10:20:52 [name] ([id number]) -------------------------------------------------------------------------------- Wound Assessment Details Patient Name: [name] Date of Service: 10/26/2017 9:45 AM Medical Record Number: [id number] Patient Account Number: [id number] Date of Birth/Sex: [birth year] (77 y.o. 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, Hypertension, Peripheral Venous Clustered Wound: No Disease Photos Photo Uploaded By: Curtis Sites on 10/26/2017 15:07:32 Wound Measurements Length: (cm) 3.7 Width: (cm) 2.5 Depth: (cm) 0.1 Area: (cm) 7.265 Volume: (cm) 0.726 % Reduction in Area: % Reduction in Volume: Epithelialization: None Tunneling: No Undermining: No Wound Description Full Thickness Without Exposed Support Classification: Structures Wound Margin: Flat and Intact Exudate Large Amount: Exudate Type: Sanguinous Exudate Color: red Foul Odor After Cleansing: No Slough/Fibrino Yes Wound Bed Granulation Amount: Large (67-100%) Exposed Structure Granulation Quality: Red Fascia Exposed: No Necrotic Amount: Small (1-33%) Fat Layer (Subcutaneous Tissue) Exposed: Yes Necrotic Quality: Adherent Slough Tendon Exposed: No Muscle Exposed: No Joint Exposed: No Bone Exposed: No Vickers, Kacey ([id number]) Periwound Skin Texture Texture Color No Abnormalities Noted: No No Abnormalities  Noted: No Callus: No Atrophie Blanche: No Crepitus: No Cyanosis: No Excoriation: No Ecchymosis: No Induration: No Erythema:  No Rash: No Hemosiderin Staining: Yes Scarring: No Mottled: No Pallor: No Moisture Rubor: No No Abnormalities Noted: No Dry / Scaly: No Temperature / Pain Maceration: No Temperature: No Abnormality Tenderness on Palpation: Yes Wound Preparation Ulcer Cleansing: Rinsed/Irrigated with Saline Topical Anesthetic Applied: Other: lidocaine 4%, Treatment Notes Wound #3 (Right, Anterior Lower Leg) 1. Cleansed with: Clean wound with Normal Saline 2. Anesthetic Topical Lidocaine 4% cream to wound bed prior to debridement 4. Dressing Applied: Hydrafera Blue Mepitel 5. Secondary Dressing Applied ABD Pad Notes kerlix wrap bilateral with ace wrap Electronic Signature(s) Signed: 10/26/2017 3:30:12 PM By: Curtis Sites Entered By: Curtis Sites on 10/26/2017 10:22:17 [name] ([id number]) -------------------------------------------------------------------------------- Wound Assessment Details Patient Name: [name] Date of Service: 10/26/2017 9:45 AM Medical Record Number: [id number] Patient Account Number: [id number] Date of Birth/Sex: 11/10/1940 (77 y.o. 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, Hypertension, Peripheral Venous Clustered Wound: Yes Disease Photos Photo Uploaded By: Curtis Sites on 10/26/2017 15:07:33 Wound Measurements Length: (cm) 3.5 Width: (cm) 0.5 Depth: (cm) 0.1 Clustered Quantity: 2 Area: (cm) 1.374 Volume: (cm) 0.137 % Reduction in Area: % Reduction in Volume: Epithelialization: Small (1-33%) Tunneling:  No Undermining: No Wound Description Full Thickness Without Exposed Support Classification: Structures Wound Margin: Flat and Intact Exudate Large Amount: Exudate Type: Sanguinous Exudate Color: red Foul Odor After Cleansing: No Slough/Fibrino Yes Wound Bed Granulation Amount: Large (67-100%) Exposed Structure Granulation Quality: Red Fascia Exposed: No Necrotic Amount: Small (1-33%) Fat Layer (Subcutaneous Tissue) Exposed: Yes Necrotic Quality: Adherent Slough Tendon Exposed: No Muscle Exposed: No Joint Exposed: No ETHANIEL, GARFIELD ([id number]) Bone Exposed: No Periwound Skin Texture Texture Color No Abnormalities Noted: No No Abnormalities Noted: No Callus: No Atrophie Blanche: No Crepitus: No Cyanosis: No Excoriation: No Ecchymosis: No Induration: No Erythema: No Rash: No Hemosiderin Staining: Yes Scarring: No Mottled: No Pallor: No Moisture Rubor: No No Abnormalities Noted: No Dry / Scaly: No Temperature / Pain Maceration: No Temperature: No Abnormality Tenderness on Palpation: Yes Wound Preparation Ulcer Cleansing: Rinsed/Irrigated with Saline Topical Anesthetic Applied: Other: lidocaine 4%, Treatment Notes Wound #4 (Right, Proximal, Lateral Lower Leg) 1. Cleansed with: Clean wound with Normal Saline 2. Anesthetic Topical Lidocaine 4% cream to wound bed prior to debridement 4. Dressing Applied: Hydrafera Blue Mepitel 5. Secondary Dressing Applied ABD Pad Notes kerlix wrap bilateral with ace wrap Electronic Signature(s) Signed: 10/26/2017 3:30:12 PM By: Curtis Sites Entered By: Curtis Sites on 10/26/2017 10:23:27 [name] ([id number]) -------------------------------------------------------------------------------- Wound Assessment Details Patient Name: [name] Date of Service: 10/26/2017 9:45 AM Medical Record Number: [id number] Patient Account Number: [id number] Date of Birth/Sex: June 18, 1940 (77 y.o. 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, Hypertension, Peripheral Venous Clustered Wound: Yes Disease Photos Photo Uploaded By: Curtis Sites on 10/26/2017  15:07:58 Wound Measurements Length: (cm) 3.6 Width: (cm) 0.6 Depth: (cm) 0.1 Area: (cm) 1.696 Volume: (cm) 0.17 % Reduction in Area: % Reduction in Volume: Epithelialization: Small (1-33%) Tunneling: No Undermining: No Wound Description Full Thickness Without Exposed Support Classification: Structures Wound Margin: Flat and Intact Exudate Large Amount: Exudate Type: Sanguinous Exudate Color: red Foul Odor After Cleansing: No Slough/Fibrino Yes Wound Bed Granulation Amount: Large (67-100%) Exposed Structure Granulation Quality: Red Fascia Exposed: No Necrotic Amount: Small (1-33%) Fat Layer (Subcutaneous Tissue) Exposed: Yes Necrotic Quality: Adherent Slough Tendon Exposed: No Muscle Exposed: No Joint Exposed: No Bone Exposed: No CEBERT, DETTMANN ([id number]) Periwound Skin Texture Texture Color No Abnormalities Noted: No No Abnormalities Noted: No Callus: No Atrophie Blanche: No Crepitus: No Cyanosis: No Excoriation: No Ecchymosis: No Induration: No Erythema: No Rash: No Hemosiderin Staining: Yes Scarring: No Mottled: No Pallor: No Moisture Rubor: No No Abnormalities Noted: No Dry / Scaly: No Temperature / Pain Maceration: No Temperature: No Abnormality Tenderness on Palpation: Yes Wound Preparation Ulcer Cleansing: Rinsed/Irrigated with Saline Topical Anesthetic Applied: Other:  lidocaine 4%, Treatment Notes Wound #5 (Right, Distal, Lateral Lower Leg) 1. Cleansed with: Clean wound with Normal Saline 2. Anesthetic Topical Lidocaine 4% cream to wound bed prior to debridement 4. Dressing Applied: Hydrafera Blue Mepitel 5. Secondary Dressing Applied ABD Pad Notes kerlix wrap bilateral with ace wrap Electronic Signature(s) Signed: 10/26/2017 3:30:12 PM By: Curtis Sites Entered By: Curtis Sites on 10/26/2017 10:24:34 [name] ([id number]) -------------------------------------------------------------------------------- Vitals Details Patient Name: [name] Date of Service: 10/26/2017 9:45 AM Medical Record Number: [id number] Patient Account Number: [id number] Date of Birth/Sex: 18-Jul-1940 (77 y.o. Male) Treating RN: Curtis Sites Primary Care Draiden Mirsky: Angus Palms Other Clinician: Referring Jasmond River: Angus Palms Treating Margo Lama/Extender: Kathreen Cosier in Treatment: 0 Vital Signs Time Taken: 09:55 Temperature (F): 97.8 Height (in): 72 Pulse (bpm): 79 Source: Measured Respiratory Rate (breaths/min): 16 Weight (lbs): 252 Blood Pressure (mmHg): 125/69 Source: Measured Reference Range: 80 - 120 mg / dl Body Mass Index (BMI): 34.2 Electronic Signature(s) Signed: 10/26/2017 3:30:12 PM By: Curtis Sites Entered By: Curtis Sites on 10/26/2017 09:57:00

## 2017-11-02 ENCOUNTER — Encounter: Payer: Medicare Other | Admitting: Nurse Practitioner

## 2017-11-02 DIAGNOSIS — I83012 Varicose veins of right lower extremity with ulcer of calf: Secondary | ICD-10-CM | POA: Diagnosis not present

## 2017-11-06 ENCOUNTER — Other Ambulatory Visit: Payer: Self-pay | Admitting: Nurse Practitioner

## 2017-11-06 ENCOUNTER — Ambulatory Visit (INDEPENDENT_AMBULATORY_CARE_PROVIDER_SITE_OTHER): Payer: Medicare Other

## 2017-11-06 DIAGNOSIS — M79605 Pain in left leg: Secondary | ICD-10-CM | POA: Diagnosis not present

## 2017-11-06 DIAGNOSIS — M79604 Pain in right leg: Secondary | ICD-10-CM

## 2017-11-09 ENCOUNTER — Encounter: Payer: Medicare Other | Attending: Nurse Practitioner | Admitting: Nurse Practitioner

## 2017-11-09 DIAGNOSIS — I83012 Varicose veins of right lower extremity with ulcer of calf: Secondary | ICD-10-CM | POA: Diagnosis not present

## 2017-11-09 DIAGNOSIS — L97221 Non-pressure chronic ulcer of left calf limited to breakdown of skin: Secondary | ICD-10-CM | POA: Insufficient documentation

## 2017-11-09 DIAGNOSIS — L97211 Non-pressure chronic ulcer of right calf limited to breakdown of skin: Secondary | ICD-10-CM | POA: Insufficient documentation

## 2017-11-09 DIAGNOSIS — I890 Lymphedema, not elsewhere classified: Secondary | ICD-10-CM | POA: Insufficient documentation

## 2017-11-10 NOTE — Progress Notes (Signed)
[name], [name] ([id number]) Visit Report for 11/02/2017 Arrival Information Details Patient Name: Carlos Roach, Kanai Date of Service: 11/02/2017 1:45 PM Medical Record Number: [id number] Patient Account Number: [id number] Date of Birth/Sex: 02-17-1941 (77 y.o. M) Treating RN: Curtis Sitesorthy, Joanna Primary Care Ramond Darnell: Angus PalmsGEORGE, SIONNE Other Clinician: Referring Charle Mclaurin: Angus PalmsGEORGE, SIONNE Treating Leonila Speranza/Extender: Kathreen Cosieroulter, Leah Weeks in TreatmenBuena Irisht: 1 Visit Information History Since Last Visit Added or deleted any medications: No Patient Arrived: Walker Any new allergies or adverse reactions: No Arrival Time: 14:00 Had a fall or experienced change in No Transfer Assistance: None activities of daily living that may affect Patient Identification Verified: Yes risk of falls: Secondary Verification Process Yes Signs or symptoms of abuse/neglect since last visito No Completed: Hospitalized since last visit: No Patient Has Alerts: Yes Implantable device outside of the clinic excluding No Patient Alerts: Patient on Blood cellular tissue based products placed in the center Thinner since last visit: Eliquis Has Dressing in Place as Prescribed: Yes Has Compression in Place as Prescribed: Yes Pain Present Now: No Electronic Signature(s) Signed: 11/06/2017 1:40:26 PM By: Curtis Sitesorthy, Joanna Entered By: Curtis Sitesorthy, Joanna on 11/06/2017 13:40:25 Carlos Roach, Rayquon ([id number]) -------------------------------------------------------------------------------- Clinic Level of Care Assessment Details Patient Name: Carlos Roach, Carlos Roach Date of Service: 11/02/2017 1:45 PM Medical Record Number: [id number] Patient Account Number: [id number] Date of Birth/Sex: 02-17-1941 (76 y.o. M) Treating RN: Curtis Sitesorthy, Joanna Primary Care Mcgregor Tinnon: Angus PalmsGEORGE, SIONNE Other Clinician: Referring Conya Ellinwood: Angus PalmsGEORGE, SIONNE Treating October Peery/Extender: Kathreen Cosieroulter, Leah Weeks in Treatment: 1 Clinic Level of Care Assessment Items TOOL 4 Quantity  Score []  - Use when only an EandM is performed on FOLLOW-UP visit 0 ASSESSMENTS - Nursing Assessment / Reassessment X - Reassessment of Co-morbidities (includes updates in patient status) 1 10 X- 1 5 Reassessment of Adherence to Treatment Plan ASSESSMENTS - Wound and Skin Assessment / Reassessment []  - Simple Wound Assessment / Reassessment - one wound 0 X- 5 5 Complex Wound Assessment / Reassessment - multiple wounds []  - 0 Dermatologic / Skin Assessment (not related to wound area) ASSESSMENTS - Focused Assessment X - Circumferential Edema Measurements - multi extremities 2 5 []  - 0 Nutritional Assessment / Counseling / Intervention X- 1 5 Lower Extremity Assessment (monofilament, tuning fork, pulses) []  - 0 Peripheral Arterial Disease Assessment (using hand held doppler) ASSESSMENTS - Ostomy and/or Continence Assessment and Care []  - Incontinence Assessment and Management 0 []  - 0 Ostomy Care Assessment and Management (repouching, etc.) PROCESS - Coordination of Care X - Simple Patient / Family Education for ongoing care 1 15 []  - 0 Complex (extensive) Patient / Family Education for ongoing care []  - 0 Staff obtains ChiropractorConsents, Records, Test Results / Process Orders []  - 0 Staff telephones HHA, Nursing Homes / Clarify orders / etc []  - 0 Routine Transfer to another Facility (non-emergent condition) []  - 0 Routine Hospital Admission (non-emergent condition) []  - 0 New Admissions / Manufacturing engineernsurance Authorizations / Ordering NPWT, Apligraf, etc. []  - 0 Emergency Hospital Admission (emergent condition) X- 1 10 Simple Discharge Coordination Carlos Roach, Elion ([id number]) []  - 0 Complex (extensive) Discharge Coordination PROCESS - Special Needs []  - Pediatric / Minor Patient Management 0 []  - 0 Isolation Patient Management []  - 0 Hearing / Language / Visual special needs []  - 0 Assessment of Community assistance (transportation, D/C planning, etc.) []  - 0 Additional assistance  / Altered mentation []  - 0 Support Surface(s) Assessment (bed, cushion, seat, etc.) INTERVENTIONS - Wound Cleansing / Measurement []  - Simple Wound Cleansing - one wound 0 X- 5 5 Complex Wound Cleansing -  multiple wounds X- 1 5 Wound Imaging (photographs - any number of wounds) []  - 0 Wound Tracing (instead of photographs) []  - 0 Simple Wound Measurement - one wound X- 5 5 Complex Wound Measurement - multiple wounds INTERVENTIONS - Wound Dressings []  - Small Wound Dressing one or multiple wounds 0 []  - 0 Medium Wound Dressing one or multiple wounds X- 2 20 Large Wound Dressing one or multiple wounds []  - 0 Application of Medications - topical []  - 0 Application of Medications - injection INTERVENTIONS - Miscellaneous []  - External ear exam 0 []  - 0 Specimen Collection (cultures, biopsies, blood, body fluids, etc.) []  - 0 Specimen(s) / Culture(s) sent or taken to Lab for analysis []  - 0 Patient Transfer (multiple staff / Nurse, adult / Similar devices) []  - 0 Simple Staple / Suture removal (25 or less) []  - 0 Complex Staple / Suture removal (26 or more) []  - 0 Hypo / Hyperglycemic Management (close monitor of Blood Glucose) []  - 0 Ankle / Brachial Index (ABI) - do not check if billed separately X- 1 5 Vital Signs SAKET, HELLSTROM ([id number]) Has the patient been seen at the hospital within the last three years: Yes Total Score: 180 Level Of Care: ____ Electronic Signature(s) Signed: 11/07/2017 5:54:46 PM By: Curtis Sites Entered By: Curtis Sites on 11/06/2017 13:52:09 Carlos Roach ([id number]) -------------------------------------------------------------------------------- Encounter Discharge Information Details Patient Name: Carlos Roach Date of Service: 11/02/2017 1:45 PM Medical Record Number: [id number] Patient Account Number: [id number] Date of Birth/Sex: 11/25/1940 (77 y.o. M) Treating RN: Curtis Sites Primary Care Svetlana Bagby: Angus Palms Other  Clinician: Referring Anila Bojarski: Angus Palms Treating Lashanti Chambless/Extender: Kathreen Cosier in Treatment: 1 Encounter Discharge Information Items Discharge Condition: Stable Ambulatory Status: Walker Discharge Destination: Home Transportation: Private Auto Accompanied By: self Schedule Follow-up Appointment: Yes Clinical Summary of Care: Electronic Signature(s) Signed: 11/06/2017 1:53:59 PM By: Curtis Sites Entered By: Curtis Sites on 11/06/2017 13:53:59 Carlos Roach ([id number]) -------------------------------------------------------------------------------- General Visit Notes Details Patient Name: Carlos Roach Date of Service: 11/02/2017 1:45 PM Medical Record Number: [id number] Patient Account Number: [id number] Date of Birth/Sex: April 11, 1941 (76 y.o. M) Treating RN: Curtis Sites Primary Care Faithanne Verret: Angus Palms Other Clinician: Referring Debbra Digiulio: Angus Palms Treating Adrijana Haros/Extender: Bonnell Public Weeks in Treatment: 1 Notes Late entry r/t Iheal being down on 11/02/2017 and documentation was completed on downtime forms Electronic Signature(s) Signed: 11/06/2017 1:41:24 PM By: Curtis Sites Entered By: Curtis Sites on 11/06/2017 13:41:24 Carlos Roach ([id number]) -------------------------------------------------------------------------------- Lower Extremity Assessment Details Patient Name: Carlos Roach Date of Service: 11/02/2017 1:45 PM Medical Record Number: [id number] Patient Account Number: [id number] Date of Birth/Sex: 05-Feb-1941 (76 y.o. M) Treating RN: Curtis Sites Primary Care Satvik Parco: Angus Palms Other Clinician: Referring Felice Deem: Angus Palms Treating Shawna Kiener/Extender: Kathreen Cosier in Treatment: 1 Edema Assessment Assessed: [Left: No] [Right: No] [Left: Edema] [Right: :] Calf Left: Right: Point of Measurement: 36 cm From Medial Instep 43.6 cm 44.7 cm Ankle Left: Right: Point of Measurement: 14 cm From  Medial Instep 28.3 cm 29.5 cm Vascular Assessment Pulses: Dorsalis Pedis Palpable: [Left:Yes] [Right:Yes] Posterior Tibial Extremity colors, hair growth, and conditions: Extremity Color: [Left:Hyperpigmented] [Right:Hyperpigmented] Hair Growth on Extremity: [Left:No] [Right:No] Temperature of Extremity: [Left:Warm] [Right:Warm] Capillary Refill: [Left:< 3 seconds] [Right:< 3 seconds] Toe Nail Assessment Left: Right: Thick: Yes Yes Discolored: Yes Yes Deformed: Yes Yes Improper Length and Hygiene: No No Electronic Signature(s) Signed: 11/06/2017 1:48:40 PM By: Curtis Sites Entered By: Curtis Sites on 11/06/2017 13:48:40 Carlos Roach ([id number]) -------------------------------------------------------------------------------- Multi Wound Chart Details Patient Name:  Carlos Roach Date of Service: 11/02/2017 1:45 PM Medical Record Number: [id number] Patient Account Number: [id number] Date of Birth/Sex: Apr 22, 1940 (77 y.o. 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 Comorbid History: N/A N/A Sleep Apnea, Arrhythmia, Congestive Heart Failure, Hypertension, Peripheral Venous Disease Date Acquired: 08/14/2017 08/14/2017 08/14/2017 Weeks of Treatment: 1 1 1  Wound Status: Healed - Epithelialized Healed - Epithelialized Open Clustered Wound: No No No Measurements L x W x D 0x0x0 0x0x0 2.6x1.7x0.1 (cm) Area (cm) : 0 0 3.471 Volume (cm) : 0 0 0.347 %  Reduction in Area: 100.00% 100.00% 52.20% % Reduction in Volume: 100.00% 100.00% 52.20% Classification: Full Thickness Without Full Thickness Without Full Thickness Without Exposed Support Structures Exposed Support Structures Exposed Support Structures Exudate Amount: N/A N/A Large Exudate Type: N/A N/A Serous Exudate Color: N/A N/A amber Wound Margin: N/A N/A Flat and Intact Granulation Amount: N/A N/A Medium (34-66%) Granulation Quality: N/A N/A Pink Necrotic Amount: N/A N/A Medium (34-66%) Epithelialization: N/A N/A None Periwound Skin Texture: No Abnormalities Noted No Abnormalities Noted Excoriation: No Induration: No CLABORN, JANUSZ ([id number]) Callus: No Crepitus: No Rash: No Scarring: No Periwound Skin Moisture: No Abnormalities Noted No Abnormalities Noted Dry/Scaly: Yes Maceration: No Periwound Skin Color: No Abnormalities Noted No Abnormalities Noted Hemosiderin Staining: Yes Atrophie Blanche: No Cyanosis: No Ecchymosis: No Erythema: No Mottled: No Pallor: No Rubor: No Temperature: N/A N/A No Abnormality Tenderness on Palpation: No No Yes Wound Preparation: N/A N/A Ulcer Cleansing: Rinsed/Irrigated with Saline Topical Anesthetic Applied: Other: lidocaine 4% Wound Number: 4 5 N/A Photos: N/A Wound Location: Right, Proximal, Lateral Lower Right Lower Leg - Lateral, N/A Leg Distal Wounding Event: Blister Blister N/A Primary Etiology: Venous Leg Ulcer Venous Leg Ulcer N/A Comorbid History: N/A Sleep Apnea, Arrhythmia, N/A Congestive Heart Failure, Hypertension, Peripheral Venous Disease Date Acquired: 08/14/2017 08/14/2017 N/A Weeks of Treatment: 1 1 N/A Wound Status: Healed - Epithelialized Open N/A Clustered Wound: Yes Yes N/A Measurements L x W x D 0x0x0 1.5x0.8x0.1 N/A (cm) Area (cm) : 0 0.942 N/A Volume (cm) : 0 0.094 N/A % Reduction in Area: 100.00% 44.50% N/A % Reduction in Volume: 100.00% 44.70% N/A Classification: Full Thickness Without  Full Thickness Without N/A Exposed Support Structures Exposed Support Structures Exudate Amount: N/A Large N/A Exudate Type: N/A Sanguinous N/A Exudate Color: N/A red N/A Wound Margin: N/A Flat and Intact N/A Granulation Amount: N/A Medium (34-66%) N/A Granulation Quality: N/A Pink N/A CHILD, CAMPOY ([id number]) Necrotic Amount: N/A Medium (34-66%) N/A Exposed Structures: N/A Fascia: No N/A Fat Layer (Subcutaneous Tissue) Exposed: No Tendon: No Muscle: No Joint: No Bone: No Epithelialization: N/A Small (1-33%) N/A Periwound Skin Texture: No Abnormalities Noted Excoriation: No N/A Induration: No Callus: No Crepitus: No Rash: No Scarring: No Periwound Skin Moisture: No Abnormalities Noted Dry/Scaly: Yes N/A Maceration: No Periwound Skin Color: No Abnormalities Noted Hemosiderin Staining: Yes N/A Atrophie Blanche: No Cyanosis: No Ecchymosis: No Erythema: No Mottled: No Pallor: No Rubor: No Temperature: N/A No Abnormality N/A Tenderness on Palpation: No Yes N/A Wound Preparation: N/A Ulcer Cleansing: N/A Rinsed/Irrigated with Saline Topical Anesthetic Applied: Other: lidocaine 4% Treatment Notes  Wound #3 (Right, Anterior Lower Leg) 1. Cleansed with: Clean wound with Normal Saline 2. Anesthetic Topical Lidocaine 4% cream to wound bed prior to debridement 4. Dressing Applied: Hydrafera Blue Mepitel 5. Secondary Dressing Applied ABD Pad Notes kerlix wrap bilateral with ace wrap Wound #5 (Right, Distal, Lateral Lower Leg) 1. Cleansed with: Clean wound with Normal Saline 2. Anesthetic Topical Lidocaine 4% cream to wound bed prior to debridement 4. Dressing Applied: [address] ARLANDO, LEISINGER ([id number]) 5. Secondary Dressing Applied ABD Pad Notes kerlix wrap bilateral with ace wrap Electronic Signature(s) Signed: 11/07/2017 7:37:31 PM By: Bonnell Public Previous Signature: 11/06/2017 1:49:06 PM Version By: Curtis Sites Entered By:  Bonnell Public on 11/07/2017 19:37:31 Carlos Roach ([id number]) -------------------------------------------------------------------------------- Multi-Disciplinary Care Plan Details Patient Name: Carlos Roach Date of Service: 11/02/2017 1:45 PM Medical Record Number: [id number] Patient Account Number: [id number] Date of Birth/Sex: [birth year] (76 y.o. M) Treating RN: Curtis Sites Primary Care Danialle Dement: Angus Palms Other Clinician: Referring Arrion Burruel: Angus Palms Treating Tieler Cournoyer/Extender: Kathreen Cosier in Treatment: 1 Active Inactive ` Abuse / Safety / Falls / Self Care Management Nursing Diagnoses: Potential for falls Goals: Patient will not experience any injury related to falls Date Initiated: 10/26/2017 Target Resolution Date: 02/17/2018 Goal Status: Active Interventions: Assess Activities of Daily Living upon admission and as needed Assess fall risk on admission and as needed Assess: immobility, friction, shearing, incontinence upon admission and as needed Assess impairment of mobility on admission and as needed per policy Notes: ` Nutrition Nursing Diagnoses: Imbalanced nutrition Potential for alteratiion in Nutrition/Potential for imbalanced nutrition Goals: Patient/caregiver agrees to and verbalizes understanding of need to use nutritional supplements and/or vitamins as prescribed Date Initiated: 10/26/2017 Target Resolution Date: 02/17/2018 Goal Status: Active Interventions: Assess patient nutrition upon admission and as needed per policy Notes: ` Orientation to the Wound Care Program Nursing Diagnoses: Knowledge deficit related to the wound healing center program Goals: Patient/caregiver will verbalize understanding of the Wound Healing Center Program AYLEN, RAMBERT ([id number]) Date Initiated: 10/26/2017 Target Resolution Date: 11/18/2017 Goal Status: Active Interventions: Provide education on orientation to the wound  center Notes: ` Wound/Skin Impairment Nursing Diagnoses: Impaired tissue integrity Knowledge deficit related to ulceration/compromised skin integrity Goals: Ulcer/skin breakdown will have a volume reduction of 80% by week 12 Date Initiated: 10/26/2017 Target Resolution Date: 02/17/2018 Goal Status: Active Interventions: Assess patient/caregiver ability to perform ulcer/skin care regimen upon admission and as needed Assess ulceration(s) every visit Notes: Electronic Signature(s) Signed: 11/06/2017 1:48:53 PM By: Curtis Sites Entered By: Curtis Sites on 11/06/2017 13:48:51 Carlos Roach ([id number]) -------------------------------------------------------------------------------- Pain Assessment Details Patient Name: Carlos Roach Date of Service: 11/02/2017 1:45 PM Medical Record Number: [id number] Patient Account Number: [id number] Date of Birth/Sex: Nov 20, 1940 (77 y.o. M) Treating RN: Curtis Sites Primary Care Gedalya Jim: Angus Palms Other Clinician: Referring Francisco Eyerly: Angus Palms Treating Makeya Hilgert/Extender: Kathreen Cosier in Treatment: 1 Active Problems Location of Pain Severity and Description of Pain Patient Has Paino No Site Locations Pain Management and Medication Current Pain Management: Electronic Signature(s) Signed: 11/07/2017 5:54:46 PM By: Curtis Sites Entered By: Curtis Sites on 11/06/2017 13:41:39 Carlos Roach ([id number]) -------------------------------------------------------------------------------- Patient/Caregiver Education Details Patient Name: Carlos Roach Date of Service: 11/02/2017 1:45 PM Medical Record Number: [id number] Patient Account Number: [id number] Date of Birth/Gender: June 29, 1940 (77 y.o. M) Treating RN: Curtis Sites Primary Care Physician: Angus Palms Other Clinician: Referring Physician: Angus Palms Treating Physician/Extender: Kathreen Cosier in Treatment: 1 Education Assessment Education  Provided To: Patient Education Topics Provided Venous: Handouts: Other: please use an ace wrap  from toes to 3cm below the knee Methods: Demonstration, Explain/Verbal Responses: State content correctly Electronic Signature(s) Signed: 11/07/2017 5:54:46 PM By: Curtis Sites Entered By: Curtis Sites on 11/06/2017 13:54:34 Carlos Roach ([id number]) -------------------------------------------------------------------------------- Wound Assessment Details Patient Name: Carlos Roach Date of Service: 11/02/2017 1:45 PM Medical Record Number: [id number] Patient Account Number: [id number] Date of Birth/Sex: 05-11-1940 (77 y.o. M) Treating RN: Curtis Sites Primary Care Trinitie Mcgirr: Angus Palms Other Clinician: Referring Livi Mcgann: Angus Palms Treating Dmitri Pettigrew/Extender: Bonnell Public Weeks in Treatment: 1 Wound Status Wound Number: 1 Primary Etiology: Venous Leg Ulcer Wound Location: Left, Anterior Lower Leg Wound Status: Healed - Epithelialized Wounding Event: Blister Date Acquired: 08/14/2017 Weeks Of Treatment: 1 Clustered Wound: No Photos Photo Uploaded By: Curtis Sites on 11/06/2017 17:36:06 Wound Measurements Length: (cm) 0 Width: (cm) 0 Depth: (cm) 0 Area: (cm) 0 Volume: (cm) 0 % Reduction in Area: 100% % Reduction in Volume: 100% Wound Description Full Thickness Without Exposed Support Classification: Structures Periwound Skin Texture Texture Color No Abnormalities Noted: No No Abnormalities Noted: No Moisture No Abnormalities Noted: No Electronic Signature(s) Signed: 11/07/2017 5:54:46 PM By: Curtis Sites Entered By: Curtis Sites on 11/06/2017 13:43:56 Carlos Roach ([id number]) -------------------------------------------------------------------------------- Wound Assessment Details Patient Name: Carlos Roach Date of Service: 11/02/2017 1:45 PM Medical Record Number: [id number] Patient Account Number: [id number] Date of Birth/Sex:  November 17, 1940 (77 y.o. M) Treating RN: Curtis Sites Primary Care Aviyon Hocevar: Angus Palms Other Clinician: Referring Giovannie Scerbo: Angus Palms Treating Yulian Gosney/Extender: Bonnell Public Weeks in Treatment: 1 Wound Status Wound Number: 2 Primary Etiology: Venous Leg Ulcer Wound Location: Left, Medial Lower Leg Wound Status: Healed - Epithelialized Wounding Event: Blister Date Acquired: 08/14/2017 Weeks Of Treatment: 1 Clustered Wound: No Photos Photo Uploaded By: Curtis Sites on 11/06/2017 17:36:08 Wound Measurements Length: (cm) 0 Width: (cm) 0 Depth: (cm) 0 Area: (cm) 0 Volume: (cm) 0 % Reduction in Area: 100% % Reduction in Volume: 100% Wound Description Full Thickness Without Exposed Support Classification: Structures Periwound Skin Texture Texture Color No Abnormalities Noted: No No Abnormalities Noted: No Moisture No Abnormalities Noted: No Electronic Signature(s) Signed: 11/07/2017 5:54:46 PM By: Curtis Sites Entered By: Curtis Sites on 11/06/2017 13:43:56 Carlos Roach ([id number]) -------------------------------------------------------------------------------- Wound Assessment Details Patient Name: Carlos Roach Date of Service: 11/02/2017 1:45 PM Medical Record Number: [id number] Patient Account Number: [id number] Date of Birth/Sex: 1940-06-07 (77 y.o. M) Treating RN: Curtis Sites Primary Care Toula Miyasaki: Angus Palms Other Clinician: Referring Bassam Dresch: Angus Palms Treating Bridgitte Felicetti/Extender: Kathreen Cosier in Treatment: 1 Wound Status Wound Number: 3 Primary Venous Leg Ulcer Etiology: Wound Location: Right Lower Leg - Anterior Wound Open Wounding Event: Blister Status: Date Acquired: 08/14/2017 Comorbid Sleep Apnea, Arrhythmia, Congestive Heart Weeks Of Treatment: 1 History: Failure, Hypertension, Peripheral Venous Clustered Wound: No Disease Photos Photo Uploaded By: Curtis Sites on 11/06/2017 17:36:30 Wound  Measurements Length: (cm) 2.6 Width: (cm) 1.7 Depth: (cm) 0.1 Area: (cm) 3.471 Volume: (cm) 0.347 % Reduction in Area: 52.2% % Reduction in Volume: 52.2% Epithelialization: None Tunneling: No Undermining: No Wound Description Full Thickness Without Exposed Support Classification: Structures Wound Margin: Flat and Intact Exudate Large Amount: Exudate Type: Serous Exudate Color: amber Foul Odor After Cleansing: No Slough/Fibrino Yes Wound Bed Granulation Amount: Medium (34-66%) Exposed Structure Granulation Quality: Pink Fascia Exposed: No Necrotic Amount: Medium (34-66%) Fat Layer (Subcutaneous Tissue) Exposed: Yes Necrotic Quality: Adherent Slough Tendon Exposed: No Muscle Exposed: No Joint Exposed: No Bone Exposed: No Barton, Quang ([id number]) Periwound Skin Texture Texture Color No Abnormalities Noted: No No Abnormalities Noted: No Callus: No Atrophie Blanche:  No Crepitus: No Cyanosis: No Excoriation: No Ecchymosis: No Induration: No Erythema: No Rash: No Hemosiderin Staining: Yes Scarring: No Mottled: No Pallor: No Moisture Rubor: No No Abnormalities Noted: No Dry / Scaly: Yes Temperature / Pain Maceration: No Temperature: No Abnormality Tenderness on Palpation: Yes Wound Preparation Ulcer Cleansing: Rinsed/Irrigated with Saline Topical Anesthetic Applied: Other: lidocaine 4%, Electronic Signature(s) Signed: 11/06/2017 1:45:54 PM By: Curtis Sites Entered By: Curtis Sites on 11/06/2017 13:45:53 Carlos Roach ([id number]) -------------------------------------------------------------------------------- Wound Assessment Details Patient Name: Carlos Roach Date of Service: 11/02/2017 1:45 PM Medical Record Number: [id number] Patient Account Number: [id number] Date of Birth/Sex: 10-May-1940 (77 y.o. M) Treating RN: Curtis Sites Primary Care Karla Vines: Angus Palms Other Clinician: Referring Sarath Privott: Angus Palms Treating  Berlyn Malina/Extender: Kathreen Cosier in Treatment: 1 Wound Status Wound Number: 4 Primary Etiology: Venous Leg Ulcer Wound Location: Right, Proximal, Lateral Lower Leg Wound Status: Healed - Epithelialized Wounding Event: Blister Date Acquired: 08/14/2017 Weeks Of Treatment: 1 Clustered Wound: Yes Photos Photo Uploaded By: Curtis Sites on 11/06/2017 17:36:32 Wound Measurements Length: (cm) 0 Width: (cm) 0 Depth: (cm) 0 Area: (cm) 0 Volume: (cm) 0 % Reduction in Area: 100% % Reduction in Volume: 100% Wound Description Full Thickness Without Exposed Support Classification: Structures Periwound Skin Texture Texture Color No Abnormalities Noted: No No Abnormalities Noted: No Moisture No Abnormalities Noted: No Electronic Signature(s) Signed: 11/07/2017 5:54:46 PM By: Curtis Sites Entered By: Curtis Sites on 11/06/2017 13:43:57 Carlos Roach ([id number]) -------------------------------------------------------------------------------- Wound Assessment Details Patient Name: Carlos Roach Date of Service: 11/02/2017 1:45 PM Medical Record Number: [id number] Patient Account Number: [id number] Date of Birth/Sex: 06-Jul-1940 (77 y.o. M) Treating RN: Curtis Sites Primary Care Jivan Symanski: Angus Palms Other Clinician: Referring Scottie Metayer: Angus Palms Treating Shone Leventhal/Extender: Kathreen Cosier in Treatment: 1 Wound Status Wound Number: 5 Primary Venous Leg Ulcer Etiology: Wound Location: Right Lower Leg - Lateral, Distal Wound Open Wounding Event: Blister Status: Date Acquired: 08/14/2017 Comorbid Sleep Apnea, Arrhythmia, Congestive Heart Weeks Of Treatment: 1 History: Failure, Hypertension, Peripheral Venous Clustered Wound: Yes Disease Photos Photo Uploaded By: Curtis Sites on 11/06/2017 17:36:45 Wound Measurements Length: (cm) 1.5 Width: (cm) 0.8 Depth: (cm) 0.1 Area: (cm) 0.942 Volume: (cm) 0.094 % Reduction in Area: 44.5% % Reduction  in Volume: 44.7% Epithelialization: Small (1-33%) Tunneling: No Undermining: No Wound Description Full Thickness Without Exposed Support Classification: Structures Wound Margin: Flat and Intact Exudate Large Amount: Exudate Type: Sanguinous Exudate Color: red Foul Odor After Cleansing: No Slough/Fibrino Yes Wound Bed Granulation Amount: Medium (34-66%) Exposed Structure Granulation Quality: Pink Fascia Exposed: No Necrotic Amount: Medium (34-66%) Fat Layer (Subcutaneous Tissue) Exposed: No Necrotic Quality: Adherent Slough Tendon Exposed: No Muscle Exposed: No Joint Exposed: No Bone Exposed: No GENNARO, LIZOTTE ([id number]) Periwound Skin Texture Texture Color No Abnormalities Noted: No No Abnormalities Noted: No Callus: No Atrophie Blanche: No Crepitus: No Cyanosis: No Excoriation: No Ecchymosis: No Induration: No Erythema: No Rash: No Hemosiderin Staining: Yes Scarring: No Mottled: No Pallor: No Moisture Rubor: No No Abnormalities Noted: No Dry / Scaly: Yes Temperature / Pain Maceration: No Temperature: No Abnormality Tenderness on Palpation: Yes Wound Preparation Ulcer Cleansing: Rinsed/Irrigated with Saline Topical Anesthetic Applied: Other: lidocaine 4%, Electronic Signature(s) Signed: 11/06/2017 1:46:49 PM By: Curtis Sites Entered By: Curtis Sites on 11/06/2017 13:46:48 Carlos Roach ([id number]) -------------------------------------------------------------------------------- Vitals Details Patient Name: Carlos Roach Date of Service: 11/02/2017 1:45 PM Medical Record Number: [id number] Patient Account Number: [id number] Date of Birth/Sex: 01-15-1941 (77 y.o. M) Treating RN: Curtis Sites Primary Care Melissa Tomaselli: Angus Palms Other Clinician: Referring  Hugo Lybrand: Angus Palms Treating Aricka Goldberger/Extender: Kathreen Cosier in Treatment: 1 Vital Signs Time Taken: 14:03 Temperature (F): 98.2 Height (in): 72 Pulse (bpm):  75 Weight (lbs): 252 Respiratory Rate (breaths/min): 16 Body Mass Index (BMI): 34.2 Blood Pressure (mmHg): 107/57 Reference Range: 80 - 120 mg / dl Electronic Signature(s) Signed: 11/06/2017 1:42:12 PM By: Curtis Sites Entered By: Curtis Sites on 11/06/2017 13:42:11

## 2017-11-16 ENCOUNTER — Encounter: Payer: Medicare Other | Admitting: Nurse Practitioner

## 2017-11-16 DIAGNOSIS — L97211 Non-pressure chronic ulcer of right calf limited to breakdown of skin: Secondary | ICD-10-CM | POA: Diagnosis not present

## 2017-11-18 NOTE — Progress Notes (Signed)
Carlos Roach, Danne ([id number]) Visit Report for 11/09/2017 Chief Complaint Document Details Patient Name: Carlos Roach, Carlos Roach Date of Service: 11/09/2017 2:00 PM Medical Record Number: [id number] Patient Account Number: [id number] Date of Birth/Sex: [birth year] (77 y.o. M) Treating RN: Phillis HaggisPinkerton, Debi Primary Care Provider: Angus PalmsGEORGE, SIONNE Other Clinician: Referring Provider: Angus PalmsGEORGE, SIONNE Treating Provider/Extender: Kathreen Cosieroulter, Trisha Ken Weeks in Treatment: 2 Information Obtained from: Patient Chief Complaint BLE ulcers Electronic Signature(s) Signed: 11/09/2017 2:31:51 PM By: Bonnell Publicoulter, Ishaan Villamar Entered By: Bonnell Publicoulter, Shahzaib Azevedo on 11/09/2017 14:31:50 Carlos Roach, Carlos Roach ([id number]) -------------------------------------------------------------------------------- HPI Details Patient Name: Carlos Roach, Garnell Date of Service: 11/09/2017 2:00 PM Medical Record Number: [id number] Patient Account Number: [id number] Date of Birth/Sex: [birth year] (77 y.o. M) Treating RN: Phillis HaggisPinkerton, Debi Primary Care Provider: Angus PalmsGEORGE, SIONNE Other Clinician: Referring Provider: Angus PalmsGEORGE, SIONNE Treating Provider/Extender: Kathreen Cosieroulter, Aniel Hubble Weeks in Treatment: 2 History of Present Illness HPI Description: 10/26/17-He presents for initial evaluation of bilateral lower extremity ulcers. He has been wearing an Unna boot compression over the last several months with minimal improvement in ulcerations. He does give a vague and somewhat confusing history regarding his lower extremity ulcers; unable to do identify if these were traumatic in nature, originally blisters, and more exact timeframe. He has not been evaluated by vascular medicine, he has elevated ABIs bilaterally in the office and will be referred to vascular medicine for evaluation. He has palpable dorsalis pedis and posttib bilaterally but we are unable to increase his compression d/t unreliable ABI results. In my opinion he would tolerate 3-4 layer compression and would have more benefit than the  unna boots. We will initiate topical treatment as some of the injuries appear traumatic with removal of dry unna wrap. He will follow up next week. He has been hospitalized with lower extremity cellulitis in the recent and remote past, most recently in March and discharged on Rocephin. He is currently on no antibiotic therapy and presents with no indication of cellulitis 11/02/17-He presents in follow up evaluation for bilateral lower extremity ulcers. He has not had unna boots, or any form of compression since last weeks appointment d/t no supplies by home health. He has arterial studies ordered for 7/29. We will continue with hydrofera blue, ace wrap and unna boots per home health 11/09/17-He presents in follow-up evaluation for bilateral lower extremity ulcers. Arterial studies performed on 7/29 revealed bilateral ABI 1.1-1.12 with bilateral TBI 0.9 and triphasic flow to the bilateral anterior tibial and posterior tibial; venous reflux studies were also performed but results are unavailable. He is improved, we will submit for compression wraps; we will continue with the same treatment plan, increasing compression to 4-layer compression wraps. He will follow up next week Electronic Signature(s) Signed: 11/09/2017 2:36:03 PM By: Bonnell Publicoulter, Kendell Sagraves Entered By: Bonnell Publicoulter, Fareedah Mahler on 11/09/2017 14:36:02 Carlos Roach, Carlos Roach ([id number]) -------------------------------------------------------------------------------- Physician Orders Details Patient Name: Carlos Roach, Demarious Date of Service: 11/09/2017 2:00 PM Medical Record Number: [id number] Patient Account Number: [id number] Date of Birth/Sex: [birth year] (77 y.o. M) Treating RN: Phillis HaggisPinkerton, Debi Primary Care Provider: Angus PalmsGEORGE, SIONNE Other Clinician: Referring Provider: Angus PalmsGEORGE, SIONNE Treating Provider/Extender: Kathreen Cosieroulter, Arley Garant Weeks in Treatment: 2 Verbal / Phone Orders: Yes Clinician: Pinkerton, Debi Read Back and Verified: Yes Diagnosis Coding Wound  Cleansing Wound #1R Left,Anterior Lower Leg o Clean wound with Normal Saline. o Cleanse wound with mild soap and water Wound #3 Right,Anterior Lower Leg o Clean wound with Normal Saline. o Cleanse wound with mild soap and water Wound #5 Right,Distal,Lateral Lower Leg o Clean wound with Normal Saline. o Cleanse wound with mild soap and water Anesthetic (add to  Medication List) Wound #1R Left,Anterior Lower Leg o Topical Lidocaine 4% cream applied to wound bed prior to debridement (In Clinic Only). Wound #3 Right,Anterior Lower Leg o Topical Lidocaine 4% cream applied to wound bed prior to debridement (In Clinic Only). Wound #5 Right,Distal,Lateral Lower Leg o Topical Lidocaine 4% cream applied to wound bed prior to debridement (In Clinic Only). Skin Barriers/Peri-Wound Care o Moisturizing lotion - not on wounds Primary Wound Dressing Wound #1R Left,Anterior Lower Leg o Mepitel One Contact layer - place on wound and then place the hydrafera blue ready transfer on o Hydrafera Blue Ready Transfer - place over the mepitel Wound #3 Right,Anterior Lower Leg o Mepitel One Contact layer - place on wound and then place the hydrafera blue ready transfer on o Hydrafera Blue Ready Transfer - place over the mepitel Wound #5 Right,Distal,Lateral Lower Leg o Mepitel One Contact layer - place on wound and then place the hydrafera blue ready transfer on o Hydrafera Blue Ready Transfer - place over the mepitel Secondary Dressing Wound #1R Left,Anterior Lower Leg o ABD pad Wound #3 Right,Anterior Lower Leg Carlos Roach, Carlos Roach ([id number]) o ABD pad Wound #5 Right,Distal,Lateral Lower Leg o ABD pad Dressing Change Frequency Wound #1R Left,Anterior Lower Leg o Dressing is to be changed Monday and Thursday. - HHRN to visit pt on Mondays to change dressings Wound #3 Right,Anterior Lower Leg o Dressing is to be changed Monday and Thursday. - HHRN to visit pt on  Mondays to change dressings Wound #5 Right,Distal,Lateral Lower Leg o Dressing is to be changed Monday and Thursday. - HHRN to visit pt on Mondays to change dressings Follow-up Appointments Wound #1R Left,Anterior Lower Leg o Return Appointment in 1 week. Wound #3 Right,Anterior Lower Leg o Return Appointment in 1 week. Wound #5 Right,Distal,Lateral Lower Leg o Return Appointment in 1 week. Edema Control Wound #1R Left,Anterior Lower Leg o 4 Layer Compression System - Bilateral Wound #3 Right,Anterior Lower Leg o 4 Layer Compression System - Bilateral Wound #5 Right,Distal,Lateral Lower Leg o 4 Layer Compression System - Bilateral Additional Orders / Instructions Wound #1R Left,Anterior Lower Leg o Increase protein intake. Wound #3 Right,Anterior Lower Leg o Increase protein intake. Wound #5 Right,Distal,Lateral Lower Leg o Increase protein intake. Home Health Wound #1R Left,Anterior Lower Leg o Continue Home Health Visits - [facility] o Home Health Nurse may visit PRN to address patientos wound care needs. o FACE TO FACE ENCOUNTER: MEDICARE and MEDICAID PATIENTS: I certify that this patient is under my care and that I had a face-to-face encounter that meets the physician face-to-face encounter requirements with this patient on this date. The encounter with the patient was in whole or in part for the following MEDICAL CONDITION: (primary reason for Home Healthcare) MEDICAL NECESSITY: I certify, that based on my findings, NURSING services are a medically necessary home health service. HOME BOUND STATUS: I certify that my clinical findings support that this patient is homebound (i.e., Due to illness or injury, pt requires aid of Carlos Roach, Carlos Roach ([id number]) supportive devices such as crutches, cane, wheelchairs, walkers, the use of special transportation or the assistance of another person to leave their place of residence. There is a normal inability to  leave the home and doing so requires considerable and taxing effort. Other absences are for medical reasons / religious services and are infrequent or of short duration when for other reasons). o If current dressing causes regression in wound condition, may D/C ordered dressing product/s and apply Normal Saline Moist Dressing daily until  next Wound Healing Center / Other MD appointment. Notify Wound Healing Center of regression in wound condition at [phone]. o Please direct any NON-WOUND related issues/requests for orders to patient's Primary Care Physician Wound #3 Right,Anterior Lower Leg o Continue Home Health Visits - [facility] o Home Health Nurse may visit PRN to address patientos wound care needs. o FACE TO FACE ENCOUNTER: MEDICARE and MEDICAID PATIENTS: I certify that this patient is under my care and that I had a face-to-face encounter that meets the physician face-to-face encounter requirements with this patient on this date. The encounter with the patient was in whole or in part for the following MEDICAL CONDITION: (primary reason for Home Healthcare) MEDICAL NECESSITY: I certify, that based on my findings, NURSING services are a medically necessary home health service. HOME BOUND STATUS: I certify that my clinical findings support that this patient is homebound (i.e., Due to illness or injury, pt requires aid of supportive devices such as crutches, cane, wheelchairs, walkers, the use of special transportation or the assistance of another person to leave their place of residence. There is a normal inability to leave the home and doing so requires considerable and taxing effort. Other absences are for medical reasons / religious services and are infrequent or of short duration when for other reasons). o If current dressing causes regression in wound condition, may D/C ordered dressing product/s and apply Normal Saline Moist Dressing daily until next Wound Healing Center  / Other MD appointment. Notify Wound Healing Center of regression in wound condition at [phone]. o Please direct any NON-WOUND related issues/requests for orders to patient's Primary Care Physician Wound #5 Right,Distal,Lateral Lower Leg o Continue Home Health Visits - [facility] o Home Health Nurse may visit PRN to address patientos wound care needs. o FACE TO FACE ENCOUNTER: MEDICARE and MEDICAID PATIENTS: I certify that this patient is under my care and that I had a face-to-face encounter that meets the physician face-to-face encounter requirements with this patient on this date. The encounter with the patient was in whole or in part for the following MEDICAL CONDITION: (primary reason for Home Healthcare) MEDICAL NECESSITY: I certify, that based on my findings, NURSING services are a medically necessary home health service. HOME BOUND STATUS: I certify that my clinical findings support that this patient is homebound (i.e., Due to illness or injury, pt requires aid of supportive devices such as crutches, cane, wheelchairs, walkers, the use of special transportation or the assistance of another person to leave their place of residence. There is a normal inability to leave the home and doing so requires considerable and taxing effort. Other absences are for medical reasons / religious services and are infrequent or of short duration when for other reasons). o If current dressing causes regression in wound condition, may D/C ordered dressing product/s and apply Normal Saline Moist Dressing daily until next Wound Healing Center / Other MD appointment. Notify Wound Healing Center of regression in wound condition at [phone]. o Please direct any NON-WOUND related issues/requests for orders to patient's Primary Care Physician Patient Medications Allergies: Mucinex Notifications Medication Indication Start End lidocaine DOSE 1 - topical 4 % cream - 1 cream topical Electronic  Signature(s) Signed: 11/09/2017 9:54:05 PM By: Bonnell Public Signed: 11/13/2017 4:59:33 PM By: Doyce Para, Gerlene Burdock ([id number]) Entered By: Alejandro Mulling on 11/09/2017 14:35:04 Carlos Roach, Carlos Roach ([id number]) -------------------------------------------------------------------------------- Prescription 11/09/2017 Patient Name: Carlos Roach Provider: Bonnell Public NP Date of Birth: 04-16-1940 NPI#: [id number] Sex: M DEA#: [id number] Phone #: [phone] License #: Patient  Address: [facility] [address] [facility] [place], [place] [address] [address], [address] [place], [place] [address] [phone] Allergies Mucinex Medication Medication: Route: Strength: Form: lidocaine 4 % topical cream topical 4% cream Class: TOPICAL LOCAL ANESTHETICS Dose: Frequency / Time: Indication: 1 1 cream topical Number of Refills: Number of Units: 0 Generic Substitution: Start Date: End Date: One Time Use: Substitution Permitted No Note to Pharmacy: Signature(s): Date(s): Electronic Signature(s) Signed: 11/09/2017 9:54:05 PM By: Bonnell Public Signed: 11/13/2017 4:59:33 PM By: Alejandro Mulling Entered By: Alejandro Mulling on 11/09/2017 14:35:05 Carlos Roach ([id number]) --------------------------------------------------------------------------------  Problem List Details Patient Name: Carlos Roach Date of Service: 11/09/2017 2:00 PM Medical Record Number: [id number] Patient Account Number: [id number] Date of Birth/Sex: May 22, 1940 (77 y.o. 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 10/26/2017 No Yes L97.221 Non-pressure chronic ulcer of left calf limited to breakdown of 10/26/2017 No Yes skin R60.9 Edema, unspecified 10/26/2017 No Yes Inactive Problems Resolved Problems Electronic Signature(s) Signed: 11/09/2017 2:31:08 PM By: Bonnell Public Entered By: Bonnell Public on 11/09/2017 14:31:08 Carlos Roach ([id number]) -------------------------------------------------------------------------------- Progress Note Details Patient Name: Carlos Roach Date of Service: 11/09/2017 2:00 PM Medical Record Number: [id number] Patient Account Number: [id number] Date of Birth/Sex: February 17, 1941 (76 y.o. M) Treating RN: Phillis Haggis Primary Care Provider: Angus Palms Other Clinician: Referring Provider: Angus Palms Treating Provider/Extender: Kathreen Cosier in Treatment: 2 Subjective Chief Complaint Information obtained from Patient BLE ulcers History of Present Illness (HPI) 10/26/17-He presents for initial evaluation of bilateral lower extremity ulcers. He has been wearing an Unna boot compression over the last several months with minimal improvement in ulcerations. He does give a vague and somewhat confusing history regarding his lower extremity ulcers; unable to do identify if these were traumatic in nature, originally blisters, and more exact timeframe. He has not been evaluated by vascular medicine, he has elevated ABIs bilaterally in the office and will be referred to vascular medicine for evaluation. He has palpable dorsalis pedis and posttib bilaterally but we are unable to increase his compression d/t unreliable ABI results. In my opinion he would tolerate 3-4 layer compression and would have more benefit than the unna boots. We will initiate topical treatment as some of the injuries appear  traumatic with removal of dry unna wrap. He will follow up next week. He has been hospitalized with lower extremity cellulitis in the recent and remote past, most recently in March and discharged on Rocephin. He is currently on no antibiotic therapy and presents with no indication of cellulitis 11/02/17-He presents in follow up evaluation for bilateral lower extremity ulcers. He has not had unna boots, or any form of compression since last weeks appointment d/t no supplies by home health. He has arterial studies ordered for 7/29. We will continue with hydrofera blue, ace wrap and unna boots per home health 11/09/17-He presents in follow-up evaluation for bilateral lower extremity ulcers. Arterial studies performed on 7/29 revealed bilateral ABI 1.1-1.12 with bilateral TBI 0.9 and triphasic flow to the bilateral anterior tibial and posterior tibial; venous reflux studies were also performed but  results are unavailable. He is improved, we will submit for compression wraps; we will continue with the same treatment plan, increasing compression to 4-layer compression wraps. He will follow up next week Objective Constitutional Vitals Time Taken: 1:53 PM, Height: 72 in, Weight: 252 lbs, BMI: 34.2, Temperature: 97.7 F, Pulse: 65 bpm, Respiratory Rate: 16 breaths/min, Blood Pressure: 110/66 mmHg. Integumentary (Hair, Skin) Wound #1R status is Open. Original cause of wound was Blister. The wound is located on the Left,Anterior Lower Leg. The wound measures 0.3cm length x 0.2cm width x 0.1cm depth; 0.047cm^2 area and 0.005cm^3 volume. There is Fat Layer (Subcutaneous Tissue) Exposed exposed. There is no tunneling or undermining noted. There is a medium amount of serosanguineous drainage noted. The wound margin is flat and intact. There is large (67-100%) granulation within the wound bed. There is no necrotic tissue within the wound bed. Wound #3 status is Open. Original cause of wound was Blister. The wound  is located on the Right,Anterior Lower Leg. The Carlos Roach, Carlos Roach ([id number]) wound measures 5cm length x 1cm width x 0.1cm depth; 3.927cm^2 area and 0.393cm^3 volume. There is no tunneling or undermining noted. There is a small amount of serous drainage noted. The wound margin is flat and intact. There is large (67- 100%) pink granulation within the wound bed. There is no necrotic tissue within the wound bed. The periwound skin appearance did not exhibit: Callus, Crepitus, Excoriation, Induration, Rash, Scarring, Dry/Scaly, Maceration, Atrophie Blanche, Cyanosis, Ecchymosis, Hemosiderin Staining, Mottled, Pallor, Rubor, Erythema. Periwound temperature was noted as No Abnormality. The periwound has tenderness on palpation. Wound #5 status is Open. Original cause of wound was Blister. The wound is located on the Right,Distal,Lateral Lower Leg. The wound measures 2cm length x 1.3cm width x 0.1cm depth; 2.042cm^2 area and 0.204cm^3 volume. The wound is limited to skin breakdown. There is no tunneling or undermining noted. There is a medium amount of serous drainage noted. The wound margin is flat and intact. There is large (67-100%) pink granulation within the wound bed. There is a small (1-33%) amount of necrotic tissue within the wound bed including Eschar. The periwound skin appearance did not exhibit: Callus, Crepitus, Excoriation, Induration, Rash, Scarring, Dry/Scaly, Maceration, Atrophie Blanche, Cyanosis, Ecchymosis, Hemosiderin Staining, Mottled, Pallor, Rubor, Erythema. Periwound temperature was noted as No Abnormality. The periwound has tenderness on palpation. 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 Cleanse wound with mild soap and water Wound #3 Right,Anterior Lower Leg: Clean wound with Normal Saline. Cleanse wound with mild soap and water Wound #5 Right,Distal,Lateral Lower Leg: Clean wound with Normal Saline. Cleanse wound with mild soap and water Anesthetic (add to Medication List): Wound #1R Left,Anterior Lower Leg: Topical Lidocaine 4% cream applied to wound bed prior to debridement (In Clinic Only). Wound #3 Right,Anterior Lower Leg: Topical Lidocaine 4% cream applied to wound bed prior to debridement (In Clinic Only). Wound #5 Right,Distal,Lateral Lower Leg: Topical Lidocaine 4% cream applied to wound bed prior to debridement (In Clinic Only). Skin Barriers/Peri-Wound Care: Moisturizing lotion - not on wounds Primary Wound Dressing: Wound #1R Left,Anterior Lower Leg: Carlos Roach, Carlos Roach ([id number]) Mepitel One Contact layer - place on wound and then place the hydrafera blue ready  transfer on [employer] - place over the mepitel Wound #3 Right,Anterior Lower Leg: Mepitel One Contact layer - place on wound and then place the hydrafera blue ready transfer on Hydrafera Blue Ready Transfer - place over the mepitel Wound #5 Right,Distal,Lateral Lower Leg: Mepitel One Contact layer - place on wound and then place the hydrafera blue ready transfer on Hydrafera Blue Ready Transfer - place over the mepitel Secondary Dressing: Wound #1R Left,Anterior Lower Leg: ABD pad Wound #3 Right,Anterior Lower Leg: ABD pad Wound #5 Right,Distal,Lateral Lower Leg: ABD pad Dressing Change Frequency: Wound #1R Left,Anterior Lower Leg: Dressing is to be changed Monday and Thursday. - HHRN to visit pt on Mondays to change dressings Wound #3 Right,Anterior Lower Leg: Dressing is to be changed Monday and Thursday. - HHRN to visit pt on Mondays to change dressings Wound #5  Right,Distal,Lateral Lower Leg: Dressing is to be changed Monday and Thursday. - HHRN to visit pt on Mondays to change dressings Follow-up Appointments: Wound #1R Left,Anterior Lower Leg: Return Appointment in 1 week. Wound #3 Right,Anterior Lower Leg: Return Appointment in 1 week. Wound #5 Right,Distal,Lateral Lower Leg: Return Appointment in 1 week. Edema Control: Wound #1R Left,Anterior Lower Leg: 4 Layer Compression System - Bilateral Wound #3 Right,Anterior Lower Leg: 4 Layer Compression System - Bilateral Wound #5 Right,Distal,Lateral Lower Leg: 4 Layer Compression System - Bilateral Additional Orders / Instructions: Wound #1R Left,Anterior Lower Leg: Increase protein intake. Wound #3 Right,Anterior Lower Leg: Increase protein intake. Wound #5 Right,Distal,Lateral Lower Leg: Increase protein intake. Home Health: Wound #1R Left,Anterior Lower Leg: Continue [facility] Health Visits - [facility] Nurse may visit PRN to address patient s wound care needs. FACE TO FACE ENCOUNTER: MEDICARE and MEDICAID PATIENTS: I certify that this patient is under my care and that I had a face-to-face encounter that meets the physician face-to-face encounter requirements with this patient on this date. The encounter with the patient was in whole or in part for the following MEDICAL CONDITION: (primary reason for Home Healthcare) MEDICAL NECESSITY: I certify, that based on my findings, NURSING services are a medically necessary home health service. HOME BOUND STATUS: I certify that my clinical findings support that this patient is homebound (i.e., Due to illness or injury, pt requires aid of supportive devices such as crutches, cane, wheelchairs, walkers, the use of special transportation or the assistance of another person to leave their place of residence. There is a normal inability to leave the home and doing so requires considerable and taxing effort. Other absences are for medical reasons /  religious services and are infrequent or of short duration when for other reasons). If current dressing causes regression in wound condition, may D/C ordered dressing product/s and apply Normal Saline Moist Dressing daily until next Wound Healing Center / Other MD appointment. Notify Wound Healing Center of regression in wound condition at [phone]. Please direct any NON-WOUND related issues/requests for orders to patient's Primary Care Physician Carlos Roach, Carlos Roach ([id number]) Wound #3 Right,Anterior Lower Leg: Continue Home Health Visits - [facility] Nurse may visit PRN to address patient s wound care needs. FACE TO FACE ENCOUNTER: MEDICARE and MEDICAID PATIENTS: I certify that this patient is under my care and that I had a face-to-face encounter that meets the physician face-to-face encounter requirements with this patient on this date. The encounter with the patient was in whole or in part for the following MEDICAL CONDITION: (primary reason for Home Healthcare) MEDICAL NECESSITY: I certify, that based on my  findings, NURSING services are a medically necessary home health service. HOME BOUND STATUS: I certify that my clinical findings support that this patient is homebound (i.e., Due to illness or injury, pt requires aid of supportive devices such as crutches, cane, wheelchairs, walkers, the use of special transportation or the assistance of another person to leave their place of residence. There is a normal inability to leave the home and doing so requires considerable and taxing effort. Other absences are for medical reasons / religious services and are infrequent or of short duration when for other reasons). If current dressing causes regression in wound condition, may D/C ordered dressing product/s and apply Normal Saline Moist Dressing daily until next Wound Healing Center / Other MD appointment. Notify Wound Healing Center of regression in wound condition at  [phone]. Please direct any NON-WOUND related issues/requests for orders to patient's Primary Care Physician Wound #5 Right,Distal,Lateral Lower Leg: Continue Home Health Visits - [facility] Nurse may visit PRN to address patient s wound care needs. FACE TO FACE ENCOUNTER: MEDICARE and MEDICAID PATIENTS: I certify that this patient is under my care and that I had a face-to-face encounter that meets the physician face-to-face encounter requirements with this patient on this date. The encounter with the patient was in whole or in part for the following MEDICAL CONDITION: (primary reason for Home Healthcare) MEDICAL NECESSITY: I certify, that based on my findings, NURSING services are a medically necessary home health service. HOME BOUND STATUS: I certify that my clinical findings support that this patient is homebound (i.e., Due to illness or injury, pt requires aid of supportive devices such as crutches, cane, wheelchairs, walkers, the use of special transportation or the assistance of another person to leave their place of residence. There is a normal inability to leave the home and doing so requires considerable and taxing effort. Other absences are for medical reasons / religious services and are infrequent or of short duration when for other reasons). If current dressing causes regression in wound condition, may D/C ordered dressing product/s and apply Normal Saline Moist Dressing daily until next Wound Healing Center / Other MD appointment. Notify Wound Healing Center of regression in wound condition at [phone]. Please direct any NON-WOUND related issues/requests for orders to patient's Primary Care Physician The following medication(s) was prescribed: lidocaine topical 4 % cream 1 1 cream topical was prescribed at facility Electronic Signature(s) Signed: 11/09/2017 2:53:35 PM By: Bonnell Public Entered By: Bonnell Public on 11/09/2017 14:53:34 Carlos Roach  ([id number]) -------------------------------------------------------------------------------- SuperBill Details Patient Name: Carlos Roach Date of Service: 11/09/2017 Medical Record Number: [id number] Patient Account Number: [id number] Date of Birth/Sex: [birth year] (76 y.o. 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

## 2017-11-22 NOTE — Progress Notes (Signed)
Carlos Roach, Doral ([id number]) Visit Report for 11/09/2017 Arrival Information Details Patient Name: Carlos Roach, Carlos Roach Date of Service: 11/09/2017 2:00 PM Medical Record Number: [id number] Patient Account Number: [id number] Date of Birth/Sex: 1940/05/25 (77 y.o. M) Treating RN: Huel CoventryWoody, Kim Primary Care Lylee Corrow: Angus PalmsGEORGE, SIONNE Other Clinician: Referring Yazan Gatling: Angus PalmsGEORGE, SIONNE Treating Demetri Goshert/Extender: Kathreen Cosieroulter, Leah Weeks in Treatment: 2 Visit Information History Since Last Visit Added or deleted any medications: No Patient Arrived: Ambulatory Any new allergies or adverse reactions: No Arrival Time: 13:52 Had a fall or experienced change in No Accompanied By: self activities of daily living that may affect Transfer Assistance: None risk of falls: Patient Identification Verified: Yes Signs or symptoms of abuse/neglect since last visito No Secondary Verification Process Yes Hospitalized since last visit: No Completed: Implantable device outside of the clinic excluding No Patient Has Alerts: Yes cellular tissue based products placed in the center Patient Alerts: Patient on Blood since last visit: Thinner Has Dressing in Place as Prescribed: No Eliquis Pain Present Now: No Bilateral ABI 1.2 Bilateral TBI 0.9 Electronic Signature(s) Signed: 11/09/2017 3:16:26 PM By: Alejandro MullingPinkerton, Debra Entered By: Alejandro MullingPinkerton, Debra on 11/09/2017 15:16:25 Carlos Roach, Carlos Roach ([id number]) -------------------------------------------------------------------------------- Lower Extremity Assessment Details Patient Name: Carlos Roach, Carlos Roach Date of Service: 11/09/2017 2:00 PM Medical Record Number: [id number] Patient Account Number: [id number] Date of Birth/Sex: 1940/05/25 (76 y.o. M) Treating RN: Huel CoventryWoody, Kim Primary Care Alene Bergerson: Angus PalmsGEORGE, SIONNE Other Clinician: Referring Allison Deshotels: Angus PalmsGEORGE, SIONNE Treating Findley Blankenbaker/Extender: Kathreen Cosieroulter, Leah Weeks in Treatment: 2 Edema Assessment Assessed: [Left: No] [Right:  No] [Left: Edema] [Right: :] Calf Left: Right: Point of Measurement: 36 cm From Medial Instep 38.5 cm 39.5 cm Ankle Left: Right: Point of Measurement: 14 cm From Medial Instep 26.5 cm 29.5 cm Vascular Assessment Claudication: Claudication Assessment [Left:None] [Right:None] Pulses: Dorsalis Pedis Palpable: [Left:Yes] [Right:Yes] Posterior Tibial Extremity colors, hair growth, and conditions: Extremity Color: [Left:Normal] [Right:Normal] Hair Growth on Extremity: [Left:Yes] [Right:Yes] Temperature of Extremity: [Left:Warm] [Right:Warm] Capillary Refill: [Left:< 3 seconds] [Right:< 3 seconds] Toe Nail Assessment Left: Right: Thick: No No Discolored: No No Deformed: No No Improper Length and Hygiene: No No Electronic Signature(s) Signed: 11/10/2017 6:17:48 PM By: Elliot GurneyWoody, BSN, RN, CWS, Kim RN, BSN Entered By: Elliot GurneyWoody, BSN, RN, CWS, Kim on 11/09/2017 14:14:18 Carlos Roach, Carlos Roach ([id number]) -------------------------------------------------------------------------------- Multi Wound Chart Details Patient Name: Carlos Roach, Carlos Roach Date of Service: 11/09/2017 2:00 PM Medical Record Number: [id number] Patient Account Number: [id number] Date of Birth/Sex: 1940/05/25 (76 y.o. M) Treating RN: Phillis HaggisPinkerton, Debi Primary Care Jaleia Hanke: Angus PalmsGEORGE, SIONNE Other Clinician: Referring Catalyna Reilly: Angus PalmsGEORGE, SIONNE Treating Rubin Dais/Extender: Kathreen Cosieroulter, Leah Weeks in Treatment: 2 Vital Signs Height(in): 72 Pulse(bpm): 65 Weight(lbs): 252 Blood Pressure(mmHg): 110/66 Body Mass Index(BMI): 34 Temperature(F): 97.7 Respiratory Rate 16 (breaths/min): Photos: [1R:No Photos] [3:No Photos] [5:No Photos] Wound Location: [1R:Left Lower Leg - Anterior] [3:Right Lower Leg - Anterior] [5:Right Lower Leg - Lateral, Distal] Wounding Event: [1R:Blister] [3:Blister] [5:Blister] 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] [3:Venous Leg Ulcer] [5:Venous Leg Ulcer] Comorbid History: [1R:Sleep Apnea, Arrhythmia, Congestive Heart  Failure, Hypertension, Peripheral Venous Disease] [3:Sleep Apnea, Arrhythmia, Congestive Heart Failure, Hypertension, Peripheral Venous Disease] [5:Sleep Apnea, Arrhythmia, Congestive Heart  Failure, Hypertension, Peripheral Venous Disease] Date Acquired: [1R:08/14/2017] [3:08/14/2017] [5:08/14/2017] Weeks of Treatment: [1R:2] [3:2] [5:2] Wound Status: [1R:Open] [3:Open] [5:Open] Wound Recurrence: [1R:Yes] [3:No] [5:No] Clustered Wound: [1R:No] [3:Yes] [5:Yes] Clustered Quantity: [1R:N/A] [3:2] [5:N/A] Measurements L x W x D [1R:0.3x0.2x0.1] [3:5x1x0.1] [5:2x1.3x0.1] (cm) Area (cm) : [1R:0.047] [3:3.927] [5:2.042] Volume (cm) : [1R:0.005] [3:0.393] [5:0.204] % Reduction in Area: [1R:93.90%] [3:45.90%] [5:-20.40%] % Reduction in Volume: [1R:93.50%] [3:45.90%] [5:-20.00%] Classification: [1R:Full  Thickness Without Exposed Support Structures] [3:Partial Thickness] [5:Full Thickness Without Exposed Support Structures] Exudate Amount: [1R:Medium] [3:Small] [5:Medium] Exudate Type: [1R:Serosanguineous] [3:Serous] [5:Serous] Exudate Color: [1R:red, brown] [3:amber] [5:amber] Wound Margin: [1R:Flat and Intact] [3:Flat and Intact] [5:Flat and Intact] Granulation Amount: [1R:Large (67-100%)] [3:Large (67-100%)] [5:Large (67-100%)] Granulation Quality: [1R:N/A] [3:Pink] [5:Pink] Necrotic Amount: [1R:None Present (0%)] [3:None Present (0%)] [5:Small (1-33%)] Necrotic Tissue: [1R:N/A] [3:N/A] [5:Eschar] Exposed Structures: [1R:Fat Layer (Subcutaneous Tissue) Exposed: Yes Fascia: No Tendon: No Muscle: No] [3:Fascia: No Fat Layer (Subcutaneous Tissue) Exposed: No Tendon: No Muscle: No] [5:Fascia: No Fat Layer (Subcutaneous Tissue) Exposed: No Tendon: No Muscle: No Joint: No] Joint: No Joint: No Bone: No Bone: No Bone: No Limited to Skin Breakdown Epithelialization: Large (67-100%) None Small (1-33%) Periwound Skin Texture: No Abnormalities Noted Excoriation: No Excoriation: No Induration:  No Induration: No Callus: No Callus: No Crepitus: No Crepitus: No Rash: No Rash: No Scarring: No Scarring: No Periwound Skin Moisture: No Abnormalities Noted Maceration: No Maceration: No Dry/Scaly: No Dry/Scaly: No Periwound Skin Color: No Abnormalities Noted Atrophie Blanche: No Atrophie Blanche: No Cyanosis: No Cyanosis: No Ecchymosis: No Ecchymosis: No Erythema: No Erythema: No Hemosiderin Staining: No Hemosiderin Staining: No Mottled: No Mottled: No Pallor: No Pallor: No Rubor: No Rubor: No Temperature: N/A No Abnormality No Abnormality Tenderness on Palpation: No Yes Yes Wound Preparation: Ulcer Cleansing: Ulcer Cleansing: Ulcer Cleansing: Rinsed/Irrigated with Saline, Rinsed/Irrigated with Saline, Rinsed/Irrigated with Saline, Other: soap and water Other: soap and water Other: soap and water Topical Anesthetic Applied: Topical Anesthetic Applied: Topical Anesthetic Applied: Other: lidocaine 4% None Other: lidocaine 4% Treatment Notes Electronic Signature(s) Signed: 11/09/2017 2:31:42 PM By: Bonnell Publicoulter, Leah Entered By: Bonnell Publicoulter, Leah on 11/09/2017 14:31:41 Carlos Roach, Oswald ([id number]) -------------------------------------------------------------------------------- Multi-Disciplinary Care Plan Details Patient Name: Carlos Roach, Carlos Roach Date of Service: 11/09/2017 2:00 PM Medical Record Number: [id number] Patient Account Number: [id number] Date of Birth/Sex: Nov 21, 1940 (76 y.o. M) Treating RN: Phillis HaggisPinkerton, Debi Primary Care Deklen Popelka: Angus PalmsGEORGE, SIONNE Other Clinician: Referring Manolito Jurewicz: Angus PalmsGEORGE, SIONNE Treating Danajah Birdsell/Extender: Kathreen Cosieroulter, Leah Weeks in Treatment: 2 Active Inactive ` Abuse / Safety / Falls / Self Care Management Nursing Diagnoses: Potential for falls Goals: Patient will not experience any injury related to falls Date Initiated: 10/26/2017 Target Resolution Date: 02/17/2018 Goal Status: Active Interventions: Assess Activities of Daily Living  upon admission and as needed Assess fall risk on admission and as needed Assess: immobility, friction, shearing, incontinence upon admission and as needed Assess impairment of mobility on admission and as needed per policy Notes: ` Nutrition Nursing Diagnoses: Imbalanced nutrition Potential for alteratiion in Nutrition/Potential for imbalanced nutrition Goals: Patient/caregiver agrees to and verbalizes understanding of need to use nutritional supplements and/or vitamins as prescribed Date Initiated: 10/26/2017 Target Resolution Date: 02/17/2018 Goal Status: Active Interventions: Assess patient nutrition upon admission and as needed per policy Notes: ` Orientation to the Wound Care Program Nursing Diagnoses: Knowledge deficit related to the wound healing center program Goals: Patient/caregiver will verbalize understanding of the Wound Healing Center Program Carlos Roach, Carlos Roach ([id number]) Date Initiated: 10/26/2017 Target Resolution Date: 11/18/2017 Goal Status: Active Interventions: Provide education on orientation to the wound center Notes: ` Wound/Skin Impairment Nursing Diagnoses: Impaired tissue integrity Knowledge deficit related to ulceration/compromised skin integrity Goals: Ulcer/skin breakdown will have a volume reduction of 80% by week 12 Date Initiated: 10/26/2017 Target Resolution Date: 02/17/2018 Goal Status: Active Interventions: Assess patient/caregiver ability to perform ulcer/skin care regimen upon admission and as needed Assess ulceration(s) every visit Notes: Electronic Signature(s) Signed: 11/13/2017 4:59:33 PM By: Alejandro MullingPinkerton, Debra Entered By: Alejandro MullingPinkerton, Debra  on 11/09/2017 14:28:15 Carlos Roach, Carlos Roach ([id number]) -------------------------------------------------------------------------------- Pain Assessment Details Patient Name: KENI, ELISON Date of Service: 11/09/2017 2:00 PM Medical Record Number: [id number] Patient Account Number: [id number] Date of  Birth/Sex: Nov 26, 1940 (77 y.o. M) Treating RN: Huel Coventry Primary Care Vishruth Seoane: Angus Palms Other Clinician: Referring Ashon Rosenberg: Angus Palms Treating Emillio Ngo/Extender: Kathreen Cosier in Treatment: 2 Active Problems Location of Pain Severity and Description of Pain Patient Has Paino No Site Locations Pain Management and Medication Current Pain Management: Electronic Signature(s) Signed: 11/10/2017 6:17:48 PM By: Elliot Gurney, BSN, RN, CWS, Kim RN, BSN Entered By: Elliot Gurney, BSN, RN, CWS, Kim on 11/09/2017 13:52:55 Carlos Irish ([id number]) -------------------------------------------------------------------------------- Wound Assessment Details Patient Name: Carlos Irish Date of Service: 11/09/2017 2:00 PM Medical Record Number: [id number] Patient Account Number: [id number] Date of Birth/Sex: 08-18-1940 (76 y.o. M) Treating RN: Huel Coventry Primary Care Delailah Spieth: Angus Palms Other Clinician: Referring Eymi Lipuma: Angus Palms Treating Charlesia Canaday/Extender: Kathreen Cosier in Treatment: 2 Wound Status Wound Number: 1R Primary Venous Leg Ulcer Etiology: Wound Location: Left Lower Leg - Anterior Wound Open Wounding Event: Blister Status: Date Acquired: 08/14/2017 Comorbid Sleep Apnea, Arrhythmia, Congestive Heart Weeks Of Treatment: 2 History: Failure, Hypertension, Peripheral Venous Clustered Wound: No Disease Photos Photo Uploaded By: Elliot Gurney, BSN, RN, CWS, Kim on 11/10/2017 18:10:45 Wound Measurements Length: (cm) 0.3 Width: (cm) 0.2 Depth: (cm) 0.1 Area: (cm) 0.047 Volume: (cm) 0.005 % Reduction in Area: 93.9% % Reduction in Volume: 93.5% Epithelialization: Large (67-100%) Tunneling: No Undermining: No Wound Description Full Thickness Without Exposed Support Foul Odo Classification: Structures Slough/F Wound Margin: Flat and Intact Exudate Medium Amount: Exudate Type: Serosanguineous Exudate Color: red, brown r After Cleansing: No ibrino No Wound  Bed Granulation Amount: Large (67-100%) Exposed Structure Necrotic Amount: None Present (0%) Fascia Exposed: No Fat Layer (Subcutaneous Tissue) Exposed: Yes Tendon Exposed: No Muscle Exposed: No Joint Exposed: No Bone Exposed: No DONNELLE, OLMEDA ([id number]) Periwound Skin Texture Texture Color No Abnormalities Noted: No No Abnormalities Noted: No Moisture No Abnormalities Noted: No Wound Preparation Ulcer Cleansing: Rinsed/Irrigated with Saline, Other: soap and water, Topical Anesthetic Applied: Other: lidocaine 4%, Electronic Signature(s) Signed: 11/10/2017 6:17:48 PM By: Elliot Gurney, BSN, RN, CWS, Kim RN, BSN Entered By: Elliot Gurney, BSN, RN, CWS, Kim on 11/09/2017 14:10:30 Carlos Irish ([id number]) -------------------------------------------------------------------------------- Wound Assessment Details Patient Name: Carlos Irish Date of Service: 11/09/2017 2:00 PM Medical Record Number: [id number] Patient Account Number: [id number] Date of Birth/Sex: Dec 31, 1940 (76 y.o. M) Treating RN: Huel Coventry Primary Care Taim Wurm: Angus Palms Other Clinician: Referring Shana Zavaleta: Angus Palms Treating Lyda Colcord/Extender: Kathreen Cosier in Treatment: 2 Wound Status Wound Number: 3 Primary Venous Leg Ulcer Etiology: Wound Location: Right Lower Leg - Anterior Wound Open Wounding Event: Blister Status: Date Acquired: 08/14/2017 Comorbid Sleep Apnea, Arrhythmia, Congestive Heart Weeks Of Treatment: 2 History: Failure, Hypertension, Peripheral Venous Clustered Wound: Yes Disease Photos Photo Uploaded By: Elliot Gurney, BSN, RN, CWS, Kim on 11/10/2017 18:10:46 Wound Measurements Length: (cm) 5 Width: (cm) 1 Depth: (cm) 0.1 Clustered Quantity: 2 Area: (cm) 3.927 Volume: (cm) 0.393 % Reduction in Area: 45.9% % Reduction in Volume: 45.9% Epithelialization: None Tunneling: No Undermining: No Wound Description Classification: Partial Thickness Foul Odor Wound Margin: Flat and  Intact Slough/Fi Exudate Amount: Small Exudate Type: Serous Exudate Color: amber After Cleansing: No brino No Wound Bed Granulation Amount: Large (67-100%) Exposed Structure Granulation Quality: Pink Fascia Exposed: No Necrotic Amount: None Present (0%) Fat Layer (Subcutaneous Tissue) Exposed: No Tendon Exposed: No Muscle Exposed: No Joint Exposed: No Bone Exposed: No TRENDEN, HAZELRIGG ([id number]) Periwound Skin  Texture Texture Color No Abnormalities Noted: No No Abnormalities Noted: No Callus: No Atrophie Blanche: No Crepitus: No Cyanosis: No Excoriation: No Ecchymosis: No Induration: No Erythema: No Rash: No Hemosiderin Staining: No Scarring: No Mottled: No Pallor: No Moisture Rubor: No No Abnormalities Noted: No Dry / Scaly: No Temperature / Pain Maceration: No Temperature: No Abnormality Tenderness on Palpation: Yes Wound Preparation Ulcer Cleansing: Rinsed/Irrigated with Saline, Other: soap and water, Topical Anesthetic Applied: None Electronic Signature(s) Signed: 11/10/2017 6:17:48 PM By: Elliot Gurney, BSN, RN, CWS, Kim RN, BSN Entered By: Elliot Gurney, BSN, RN, CWS, Kim on 11/09/2017 14:11:17 Carlos Irish ([id number]) -------------------------------------------------------------------------------- Wound Assessment Details Patient Name: Carlos Irish Date of Service: 11/09/2017 2:00 PM Medical Record Number: [id number] Patient Account Number: [id number] Date of Birth/Sex: 1940-09-22 (76 y.o. M) Treating RN: Huel Coventry Primary Care Gagandeep Pettet: Angus Palms Other Clinician: Referring Burgandy Hackworth: Angus Palms Treating Caleel Kiner/Extender: Kathreen Cosier in Treatment: 2 Wound Status Wound Number: 5 Primary Venous Leg Ulcer Etiology: Wound Location: Right Lower Leg - Lateral, Distal Wound Open Wounding Event: Blister Status: Date Acquired: 08/14/2017 Comorbid Sleep Apnea, Arrhythmia, Congestive Heart Weeks Of Treatment: 2 History: Failure, Hypertension,  Peripheral Venous Clustered Wound: Yes Disease Photos Photo Uploaded By: Elliot Gurney, BSN, RN, CWS, Kim on 11/10/2017 18:11:39 Wound Measurements Length: (cm) 2 Width: (cm) 1.3 Depth: (cm) 0.1 Area: (cm) 2.042 Volume: (cm) 0.204 % Reduction in Area: -20.4% % Reduction in Volume: -20% Epithelialization: Small (1-33%) Tunneling: No Undermining: No Wound Description Full Thickness Without Exposed Support Foul Odo Classification: Structures Slough/F Wound Margin: Flat and Intact Exudate Medium Amount: Exudate Type: Serous Exudate Color: amber r After Cleansing: No ibrino No Wound Bed Granulation Amount: Large (67-100%) Exposed Structure Granulation Quality: Pink Fascia Exposed: No Necrotic Amount: Small (1-33%) Fat Layer (Subcutaneous Tissue) Exposed: No Necrotic Quality: Eschar Tendon Exposed: No Muscle Exposed: No Joint Exposed: No Bone Exposed: No ELYON, ZOLL ([id number]) Limited to Skin Breakdown Periwound Skin Texture Texture Color No Abnormalities Noted: No No Abnormalities Noted: No Callus: No Atrophie Blanche: No Crepitus: No Cyanosis: No Excoriation: No Ecchymosis: No Induration: No Erythema: No Rash: No Hemosiderin Staining: No Scarring: No Mottled: No Pallor: No Moisture Rubor: No No Abnormalities Noted: No Dry / Scaly: No Temperature / Pain Maceration: No Temperature: No Abnormality Tenderness on Palpation: Yes Wound Preparation Ulcer Cleansing: Rinsed/Irrigated with Saline, Other: soap and water, Topical Anesthetic Applied: Other: lidocaine 4%, Electronic Signature(s) Signed: 11/10/2017 6:17:48 PM By: Elliot Gurney, BSN, RN, CWS, Kim RN, BSN Entered By: Elliot Gurney, BSN, RN, CWS, Kim on 11/09/2017 14:12:19 Carlos Irish ([id number]) -------------------------------------------------------------------------------- Vitals Details Patient Name: Carlos Irish Date of Service: 11/09/2017 2:00 PM Medical Record Number: [id number] Patient Account  Number: [id number] Date of Birth/Sex: 10-04-1940 (76 y.o. M) Treating RN: Huel Coventry Primary Care Haynes Giannotti: Angus Palms Other Clinician: Referring Adlynn Lowenstein: Angus Palms Treating Ranee Peasley/Extender: Kathreen Cosier in Treatment: 2 Vital Signs Time Taken: 13:53 Temperature (F): 97.7 Height (in): 72 Pulse (bpm): 65 Weight (lbs): 252 Respiratory Rate (breaths/min): 16 Body Mass Index (BMI): 34.2 Blood Pressure (mmHg): 110/66 Reference Range: 80 - 120 mg / dl Electronic Signature(s) Signed: 11/10/2017 6:17:48 PM By: Elliot Gurney, BSN, RN, CWS, Kim RN, BSN Entered By: Elliot Gurney, BSN, RN, CWS, Kim on 11/09/2017 13:55:15

## 2017-11-27 NOTE — Progress Notes (Signed)
Carlos Roach, Carlos Roach ([id number]) Visit Report for 11/16/2017 Arrival Information Details Patient Name: Carlos Roach, Carlos Roach Date of Service: 11/16/2017 2:15 PM Medical Record Number: [id number] Patient Account Number: [id number] Date of Birth/Sex: [birth year] (77 y.o. M) Treating RN: Rema JasmineNg, Wendi Primary Care Charnell Peplinski: Angus PalmsGEORGE, SIONNE Other Clinician: Referring Brenetta Penny: Angus PalmsGEORGE, SIONNE Treating Jagger Beahm/Extender: Kathreen Cosieroulter, Leah Weeks in Treatment: 3 Visit Information History Since Last Visit Added or deleted any medications: No Patient Arrived: Walker Any new allergies or adverse reactions: No Arrival Time: 14:28 Had a fall or experienced change in No Accompanied By: spouse activities of daily living that may affect Transfer Assistance: None risk of falls: Patient Identification Verified: Yes Signs or symptoms of abuse/neglect since last visito No Secondary Verification Process Yes Hospitalized since last visit: No Completed: Implantable device outside of the clinic excluding No Patient Has Alerts: Yes cellular tissue based products placed in the center Patient Alerts: Patient on Blood since last visit: Thinner Has Dressing in Place as Prescribed: Yes Eliquis Pain Present Now: Yes Bilateral ABI 1.2 Bilateral TBI 0.9 Electronic Signature(s) Signed: 11/16/2017 3:52:47 PM By: Rema JasmineNg, Wendi Entered By: Rema JasmineNg, Wendi on 11/16/2017 14:30:32 Carlos Roach, Pavle ([id number]) -------------------------------------------------------------------------------- Encounter Discharge Information Details Patient Name: Carlos Roach, Carlos Roach Date of Service: 11/16/2017 2:15 PM Medical Record Number: [id number] Patient Account Number: [id number] Date of Birth/Sex: [birth year] (76 y.o. M) Treating RN: Renne CriglerFlinchum, Cheryl Primary Care Vianne Grieshop: Angus PalmsGEORGE, SIONNE Other Clinician: Referring Diala Waxman: Angus PalmsGEORGE, SIONNE Treating Stella Bortle/Extender: Kathreen Cosieroulter, Leah Weeks in Treatment: 3 Encounter Discharge Information Items Discharge Condition:  Stable Ambulatory Status: Walker Discharge Destination: Home Transportation: Private Auto Schedule Follow-up Appointment: Yes Clinical Summary of Care: Electronic Signature(s) Signed: 11/16/2017 4:42:12 PM By: Renne CriglerFlinchum, Cheryl Entered By: Renne CriglerFlinchum, Cheryl on 11/16/2017 15:40:55 Carlos Roach, Darrio ([id number]) -------------------------------------------------------------------------------- Lower Extremity Assessment Details Patient Name: Carlos Roach, Carlos Roach Date of Service: 11/16/2017 2:15 PM Medical Record Number: [id number] Patient Account Number: [id number] Date of Birth/Sex: [birth year] (76 y.o. M) Treating RN: Rema JasmineNg, Wendi Primary Care Olman Yono: Angus PalmsGEORGE, SIONNE Other Clinician: Referring Dakhari Zuver: Angus PalmsGEORGE, SIONNE Treating Kynleigh Artz/Extender: Kathreen Cosieroulter, Leah Weeks in Treatment: 3 Edema Assessment Assessed: [Left: No] [Right: No] [Left: Edema] [Right: :] Calf Left: Right: Point of Measurement: 36 cm From Medial Instep 37 cm 40 cm Ankle Left: Right: Point of Measurement: 14 cm From Medial Instep 24 cm 25 cm Vascular Assessment Claudication: Claudication Assessment [Left:None] [Right:None] Pulses: Dorsalis Pedis Palpable: [Left:Yes] [Right:Yes] Posterior Tibial Extremity colors, hair growth, and conditions: Extremity Color: [Left:Red] [Right:Red] Temperature of Extremity: [Left:Warm] [Right:Warm] Capillary Refill: [Left:< 3 seconds] [Right:< 3 seconds] Toe Nail Assessment Left: Right: Thick: Yes Yes Discolored: Yes Yes Deformed: Yes Yes Improper Length and Hygiene: No No Electronic Signature(s) Signed: 11/16/2017 3:52:47 PM By: Rema JasmineNg, Wendi Entered By: Rema JasmineNg, Wendi on 11/16/2017 14:54:24 Carlos Roach, Hanna ([id number]) -------------------------------------------------------------------------------- Multi Wound Chart Details Patient Name: Carlos Roach, Carlos Roach Date of Service: 11/16/2017 2:15 PM Medical Record Number: [id number] Patient Account Number: [id number] Date of Birth/Sex: [birth year] (76  y.o. 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 Comorbid History: Sleep Apnea, Arrhythmia, Sleep Apnea, Arrhythmia, Sleep Apnea, Arrhythmia, Congestive Heart Failure, Congestive Heart Failure, Congestive Heart Failure, Hypertension, Peripheral Hypertension, Peripheral  Hypertension, Peripheral Venous Disease Venous Disease Venous Disease Date Acquired: 08/14/2017 08/14/2017 08/14/2017 Weeks of Treatment: 3 3 3  Wound Status: Open Open Open Wound Recurrence: Yes No No Clustered Wound: No Yes Yes Clustered Quantity: N/A 2 N/A Measurements L x W x D 0.1x0.1x0.1 0.1x0.1x0.1 0.2x0.2x0.1 (cm) Area (cm) : 0.008 0.008 0.031 Volume (cm) : 0.001 0.001 0.003 % Reduction in Area: 99.00% 99.90% 98.20% % Reduction in Volume: 98.70% 99.90% 98.20% Classification: Full Thickness Without Partial Thickness Full Thickness Without Exposed Support Structures Exposed Support Structures Exudate Amount: None Present None Present Medium Exudate Type: N/A N/A Serous Exudate Color: N/A N/A amber Wound Margin: Flat and Intact Flat and Intact Flat and Intact Granulation Amount: None Present (0%) None Present (0%) None Present (0%) Necrotic Amount: None Present (0%) None Present (0%) None Present (0%) Exposed Structures: Carlos Roach, Vestal ([id number]) Fascia: No Fascia: No Fascia: No Fat Layer (Subcutaneous Fat Layer (Subcutaneous Fat Layer (Subcutaneous Tissue)  Exposed: No Tissue) Exposed: No Tissue) Exposed: No Tendon: No Tendon: No Tendon: No Muscle: No Muscle: No Muscle: No Joint: No Joint: No Joint: No Bone: No Bone: No Bone: No Epithelialization: Large (67-100%) None Small (1-33%) Periwound Skin Texture: No Abnormalities Noted Excoriation: No Excoriation: No Induration: No Induration: No Callus: No Callus: No Crepitus: No Crepitus: No Rash: No Rash: No Scarring: No Scarring: No Periwound Skin Moisture: No Abnormalities Noted Maceration: No Maceration: No Dry/Scaly: No Dry/Scaly: No Periwound Skin Color: Erythema: Yes Erythema: Yes Erythema: Yes Atrophie Blanche: No Atrophie Blanche: No Cyanosis: No Cyanosis: No Ecchymosis: No Ecchymosis: No Hemosiderin Staining: No Hemosiderin Staining: No Mottled: No Mottled: No Pallor: No Pallor: No Rubor: No Rubor: No Erythema Location: Circumferential Circumferential Circumferential Temperature: N/A No Abnormality No Abnormality Tenderness on Palpation: No Yes Yes Wound Preparation: Ulcer Cleansing: Ulcer Cleansing: Ulcer Cleansing: Rinsed/Irrigated with Saline, Rinsed/Irrigated with Saline, Rinsed/Irrigated with Saline, Other: soap and water Other: soap and water Other: soap and water Topical Anesthetic Applied: Topical Anesthetic Applied: Topical Anesthetic Applied: Other: lidocaine 4% None Other: lidocaine 4% Treatment Notes Electronic Signature(s) Signed: 11/16/2017 4:46:33 PM By: Alejandro MullingPinkerton, Debra Entered By: Alejandro MullingPinkerton, Debra on 11/16/2017 15:02:22 Carlos Roach, Kevontay ([id number]) -------------------------------------------------------------------------------- Multi-Disciplinary Care Plan Details Patient Name: Carlos Roach, Akhilesh Date of Service: 11/16/2017 2:15 PM Medical Record Number: [id number] Patient Account Number: [id number] Date of Birth/Sex: 09-Jun-1940 (76 y.o. M) Treating RN: Phillis HaggisPinkerton, Debi Primary Care Dayjah Selman: Angus PalmsGEORGE, SIONNE Other  Clinician: Referring Daichi Moris: Angus PalmsGEORGE, SIONNE Treating Dupree Givler/Extender: Kathreen Cosieroulter, Leah Weeks in Treatment: 3 Active Inactive ` Abuse / Safety / Falls / Self Care Management Nursing Diagnoses: Potential for falls Goals: Patient will not experience any injury related to falls Date Initiated: 10/26/2017 Target Resolution Date: 02/17/2018 Goal Status: Active Interventions: Assess Activities of Daily Living upon admission and as needed Assess fall risk on admission and as needed Assess: immobility, friction, shearing, incontinence upon admission and as needed Assess impairment of mobility on admission and as needed per policy Notes: ` Nutrition Nursing Diagnoses: Imbalanced nutrition Potential for alteratiion in Nutrition/Potential for imbalanced nutrition Goals: Patient/caregiver agrees to and verbalizes understanding of need to use nutritional supplements and/or vitamins as prescribed Date Initiated: 10/26/2017 Target Resolution Date: 02/17/2018 Goal Status: Active Interventions: Assess patient nutrition upon admission and as needed per policy Notes: ` Orientation to the Wound Care Program Nursing Diagnoses: Knowledge deficit related to the wound healing center program Goals: Patient/caregiver will verbalize understanding of the Wound Healing Center Program Carlos Roach, Olivia ([id number]) Date Initiated: 10/26/2017 Target Resolution Date: 11/18/2017 Goal Status: Active Interventions: Provide education on orientation  to the wound center Notes: ` Wound/Skin Impairment Nursing Diagnoses: Impaired tissue integrity Knowledge deficit related to ulceration/compromised skin integrity Goals: Ulcer/skin breakdown will have a volume reduction of 80% by week 12 Date Initiated: 10/26/2017 Target Resolution Date: 02/17/2018 Goal Status: Active Interventions: Assess patient/caregiver ability to perform ulcer/skin care regimen upon admission and as needed Assess ulceration(s) every  visit Notes: Electronic Signature(s) Signed: 11/16/2017 4:46:33 PM By: Alejandro Mulling Entered By: Alejandro Mulling on 11/16/2017 15:02:08 Carlos Irish ([id number]) -------------------------------------------------------------------------------- Pain Assessment Details Patient Name: Carlos Irish Date of Service: 11/16/2017 2:15 PM Medical Record Number: [id number] Patient Account Number: [id number] Date of Birth/Sex: 05-05-1940 (77 y.o. M) Treating RN: Rema Jasmine Primary Care Mahima Hottle: Angus Palms Other Clinician: Referring Arrianna Catala: Angus Palms Treating Harvard Zeiss/Extender: Kathreen Cosier in Treatment: 3 Active Problems Location of Pain Severity and Description of Pain Patient Has Paino Yes Site Locations Rate the pain. Current Pain Level: 0 Worst Pain Level: 10 Least Pain Level: 0 Tolerable Pain Level: 1 Character of Pain Describe the Pain: Shooting Pain Management and Medication Current Pain Management: Goals for Pain Management Topical or injectable lidocaine is offered to patient for acute pain when surgical debridement is performed. If needed, Patient is instructed to use over the counter pain medication for the following 24-48 hours after debridement. Wound care MDs do not prescribed pain medications. Patient has chronic pain or uncontrolled pain. Patient has been instructed to make an appointment with their Primary Care Physician for pain management. Electronic Signature(s) Signed: 11/16/2017 3:52:47 PM By: Rema Jasmine Entered By: Rema Jasmine on 11/16/2017 14:31:59 Carlos Irish ([id number]) -------------------------------------------------------------------------------- Patient/Caregiver Education Details Patient Name: Carlos Irish Date of Service: 11/16/2017 2:15 PM Medical Record Number: [id number] Patient Account Number: [id number] Date of Birth/Gender: February 17, 1941 (76 y.o. M) Treating RN: Renne Crigler Primary Care Physician: Angus Palms Other  Clinician: Referring Physician: Angus Palms Treating Physician/Extender: Kathreen Cosier in Treatment: 3 Education Assessment Education Provided To: Patient Education Topics Provided Wound/Skin Impairment: Handouts: Caring for Your Ulcer Methods: Explain/Verbal Responses: State content correctly Electronic Signature(s) Signed: 11/16/2017 4:42:12 PM By: Renne Crigler Entered By: Renne Crigler on 11/16/2017 15:41:09 Carlos Irish ([id number]) -------------------------------------------------------------------------------- Wound Assessment Details Patient Name: Carlos Irish Date of Service: 11/16/2017 2:15 PM Medical Record Number: [id number] Patient Account Number: [id number] Date of Birth/Sex: 04-07-1941 (77 y.o. M) Treating RN: Phillis Haggis Primary Care Nickoli Bagheri: Angus Palms Other Clinician: Referring Shontae Rosiles: Angus Palms Treating Adeeb Konecny/Extender: Kathreen Cosier in Treatment: 3 Wound Status Wound Number: [id number] Primary Venous Leg Ulcer Etiology: Wound Location: Left Lower Leg - Anterior Wound Healed - Epithelialized Wounding Event: Blister Status: Date Acquired: 08/14/2017 Comorbid Sleep Apnea, Arrhythmia, Congestive Heart Weeks Of Treatment: 3 History: Failure, Hypertension, Peripheral Venous Clustered Wound: No Disease Wound Measurements Length: (cm) 0 Width: (cm) 0 Depth: (cm) 0 Area: (cm) 0 Volume: (cm) 0 % Reduction in Area: 100% % Reduction in Volume: 100% Epithelialization: Large (67-100%) Tunneling: No Undermining: No Wound Description Full Thickness Without Exposed Support Classification: Structures Wound Margin: Flat and Intact Exudate None Present Amount: Foul Odor After Cleansing: No Slough/Fibrino No Wound Bed Granulation Amount: None Present (0%) Exposed Structure Necrotic Amount: None Present (0%) Fascia Exposed: No Fat Layer (Subcutaneous Tissue) Exposed: No Tendon Exposed: No Muscle Exposed: No Joint  Exposed: No Bone Exposed: No Periwound Skin Texture Texture Color No Abnormalities Noted: No No Abnormalities Noted: No Erythema: Yes Moisture Erythema Location: Circumferential No Abnormalities Noted: No Wound Preparation Ulcer Cleansing: Rinsed/Irrigated with Saline, Other: soap and water, Topical Anesthetic Applied: Other: lidocaine 4%,  Electronic Signature(s) Signed: 11/16/2017 3:27:58 PM By: Alejandro Mulling Entered By: Alejandro Mulling on 11/16/2017 15:27:57 Carlos Irish ([id number]) DOMINGOS, RIGGI ([id number]) -------------------------------------------------------------------------------- Wound Assessment Details Patient Name: Carlos Irish Date of Service: 11/16/2017 2:15 PM Medical Record Number: [id number] Patient Account Number: [id number] Date of Birth/Sex: 1940/06/19 (76 y.o. M) Treating RN: Phillis Haggis Primary Care Marvalene Barrett: Angus Palms Other Clinician: Referring Delton Stelle: Angus Palms Treating Forbes Loll/Extender: Kathreen Cosier in Treatment: 3 Wound Status Wound Number: 3 Primary Venous Leg Ulcer Etiology: Wound Location: Right Lower Leg - Anterior Wound Healed - Epithelialized Wounding Event: Blister Status: Date Acquired: 08/14/2017 Comorbid Sleep Apnea, Arrhythmia, Congestive Heart Weeks Of Treatment: 3 History: Failure, Hypertension, Peripheral Venous Clustered Wound: Yes Disease Wound Measurements Length: (cm) 0 % Reduct Width: (cm) 0 % Reduct Depth: (cm) 0 Epitheli Clustered Quantity: 2 Tunnelin Area: (cm) 0 Undermi Volume: (cm) 0 ion in Area: 100% ion in Volume: 100% alization: None g: No ning: No Wound Description Classification: Partial Thickness Foul Odo Wound Margin: Flat and Intact Slough/F Exudate Amount: None Present r After Cleansing: No ibrino No Wound Bed Granulation Amount: None Present (0%) Exposed Structure Necrotic Amount: None Present (0%) Fascia Exposed: No Fat Layer (Subcutaneous Tissue)  Exposed: No Tendon Exposed: No Muscle Exposed: No Joint Exposed: No Bone Exposed: No Periwound Skin Texture Texture Color No Abnormalities Noted: No No Abnormalities Noted: No Callus: No Atrophie Blanche: No Crepitus: No Cyanosis: No Excoriation: No Ecchymosis: No Induration: No Erythema: Yes Rash: No Erythema Location: Circumferential Scarring: No Hemosiderin Staining: No Mottled: No Moisture Pallor: No No Abnormalities Noted: No Rubor: No Dry / Scaly: No Maceration: No Temperature / Pain Temperature: No Abnormality CARLEY, Damarian ([id number]) Tenderness on Palpation: Yes Wound Preparation Ulcer Cleansing: Rinsed/Irrigated with Saline, Other: soap and water, Topical Anesthetic Applied: None Electronic Signature(s) Signed: 11/16/2017 3:28:23 PM By: Alejandro Mulling Entered By: Alejandro Mulling on 11/16/2017 15:28:23 Carlos Irish ([id number]) -------------------------------------------------------------------------------- Wound Assessment Details Patient Name: Carlos Irish Date of Service: 11/16/2017 2:15 PM Medical Record Number: [id number] Patient Account Number: [id number] Date of Birth/Sex: Jun 17, 1940 (77 y.o. M) Treating RN: Phillis Haggis Primary Care Luella Gardenhire: Angus Palms Other Clinician: Referring Jaxsun Ciampi: Angus Palms Treating Nihaal Friesen/Extender: Kathreen Cosier in Treatment: 3 Wound Status Wound Number: 5 Primary Venous Leg Ulcer Etiology: Wound Location: Right, Distal, Lateral Lower Leg Wound Healed - Epithelialized Wounding Event: Blister Status: Date Acquired: 08/14/2017 Comorbid Sleep Apnea, Arrhythmia, Congestive Heart Weeks Of Treatment: 3 History: Failure, Hypertension, Peripheral Venous Clustered Wound: Yes Disease Wound Measurements Length: (cm) 0 % Re Width: (cm) 0 % Re Depth: (cm) 0 Epit Area: (cm) 0 Tun Volume: (cm) 0 Und duction in Area: 100% duction in Volume: 100% helialization: Small (1-33%) neling:  No ermining: No Wound Description Full Thickness Without Exposed Support Foul Classification: Structures Slou Wound Margin: Flat and Intact Exudate Medium Amount: Exudate Type: Serous Exudate Color: amber Odor After Cleansing: No gh/Fibrino No Wound Bed Granulation Amount: None Present (0%) Exposed Structure Necrotic Amount: None Present (0%) Fascia Exposed: No Fat Layer (Subcutaneous Tissue) Exposed: No Tendon Exposed: No Muscle Exposed: No Joint Exposed: No Bone Exposed: No Periwound Skin Texture Texture Color No Abnormalities Noted: No No Abnormalities Noted: No Callus: No Atrophie Blanche: No Crepitus: No Cyanosis: No Excoriation: No Ecchymosis: No Induration: No Erythema: Yes Rash: No Erythema Location: Circumferential Scarring: No Hemosiderin Staining: No Mottled: No Moisture Pallor: No No Abnormalities Noted: No Rubor: No MAGES, Bashar ([id number]) Dry / Scaly: No Temperature / Pain Maceration: No Temperature: No Abnormality Tenderness on Palpation: Yes  Wound Preparation Ulcer Cleansing: Rinsed/Irrigated with Saline, Other: soap and water, Topical Anesthetic Applied: Other: lidocaine 4%, Electronic Signature(s) Signed: 11/16/2017 4:46:33 PM By: Alejandro Mulling Entered By: Alejandro Mulling on 11/16/2017 15:04:37 Carlos Irish ([id number]) -------------------------------------------------------------------------------- Vitals Details Patient Name: Carlos Irish Date of Service: 11/16/2017 2:15 PM Medical Record Number: [id number] Patient Account Number: [id number] Date of Birth/Sex: 24-Aug-1940 (77 y.o. M) Treating RN: Rema Jasmine Primary Care Hulbert Branscome: Angus Palms Other Clinician: Referring Thoms Barthelemy: Angus Palms Treating Dorethia Jeanmarie/Extender: Kathreen Cosier in Treatment: 3 Vital Signs Time Taken: 14:30 Temperature (F): 97.9 Height (in): 72 Pulse (bpm): 62 Weight (lbs): 252 Respiratory Rate (breaths/min): 18 Body Mass Index  (BMI): 34.2 Blood Pressure (mmHg): 114/62 Reference Range: 80 - 120 mg / dl Electronic Signature(s) Signed: 11/16/2017 3:52:47 PM By: Rema Jasmine Entered By: Rema Jasmine on 11/16/2017 14:35:12

## 2017-11-27 NOTE — Progress Notes (Signed)
VALLEN, CALABRESE ([id number]) Visit Report for 11/16/2017 Chief Complaint Document Details Patient Name: Carlos Roach, Carlos Roach Date of Service: 11/16/2017 2:15 PM Medical Record Number: [id number] Patient Account Number: [id number] Date of Birth/Sex: 06-27-1940 (77 y.o. M) Treating RN: Phillis Haggis Primary Care Provider: Angus Palms Other Clinician: Referring Provider: Angus Palms Treating Provider/Extender: Kathreen Cosier in Treatment: 3 Information Obtained from: Patient Chief Complaint BLE ulcers Electronic Signature(s) Signed: 11/16/2017 3:27:54 PM By: Bonnell Public Entered By: Bonnell Public on 11/16/2017 15:27:54 Carlos Roach ([id number]) -------------------------------------------------------------------------------- HPI Details Patient Name: Carlos Roach Date of Service: 11/16/2017 2:15 PM Medical Record Number: [id number] Patient Account Number: [id number] Date of Birth/Sex: 02-20-1941 (76 y.o. M) Treating RN: Phillis Haggis Primary Care Provider: Angus Palms Other Clinician: Referring Provider: Angus Palms Treating Provider/Extender: Kathreen Cosier in Treatment: 3 History of Present Illness HPI Description: 10/26/17-He presents for initial evaluation of bilateral lower extremity ulcers. He has been wearing an Unna boot compression over the last several months with minimal improvement in ulcerations. He does give a vague and somewhat confusing history regarding his lower extremity ulcers; unable to do identify if these were traumatic in nature, originally blisters, and more exact timeframe. He has not been evaluated by vascular medicine, he has elevated ABIs bilaterally in the office and will be referred to vascular medicine for evaluation. He has palpable dorsalis pedis and posttib bilaterally but we are unable to increase his compression d/t unreliable ABI results. In my opinion he would tolerate 3-4 layer compression and would have more benefit than the  unna boots. We will initiate topical treatment as some of the injuries appear traumatic with removal of dry unna wrap. He will follow up next week. He has been hospitalized with lower extremity cellulitis in the recent and remote past, most recently in March and discharged on Rocephin. He is currently on no antibiotic therapy and presents with no indication of cellulitis 11/02/17-He presents in follow up evaluation for bilateral lower extremity ulcers. He has not had unna boots, or any form of compression since last weeks appointment d/t no supplies by home health. He has arterial studies ordered for 7/29. We will continue with hydrofera blue, ace wrap and unna boots per home health 11/09/17-He presents in follow-up evaluation for bilateral lower extremity ulcers. Arterial studies performed on 7/29 revealed bilateral ABI 1.1-1.12 with bilateral TBI 0.9 and triphasic flow to the bilateral anterior tibial and posterior tibial; venous reflux studies were also performed but results are unavailable. He is improved, we will submit for compression wraps; we will continue with the same treatment plan, increasing compression to 4-layer compression wraps. He will follow up next week 11/16/17-He presents in follow-up evaluation for bilateral lower extremity ulcers. The original ulcerations have healed. There are new blisters to the right pre-tibial and left lateral leg; home health doubled compression layers and the ankle/foot layers separated/rolled. We will place 4 layer compression today, this will maintain for one week. His wife is to apply ace wrap next Thursday and the following Monday as they will be out of town. She has been encouraged to contact us with any concerns/questions/intolerances. The patient will follow up in two weeks Venous studies reviewed revealing no evidence of venous insufficiency bilaterally. Electronic Signature(s) Signed: 11/16/2017 3:30:21 PM By: Bonnell Public Previous Signature:  11/16/2017 8:54:39 AM Version By: Bonnell Public Entered By: Bonnell Public on 11/16/2017 15:30:21 Carlos Roach ([id number]) -------------------------------------------------------------------------------- Physician Orders Details Patient Name: Carlos Roach Date of Service: 11/16/2017 2:15 PM Medical Record Number: [id number] Patient Account Number: [id number]  Date of Birth/Sex: 07-02-1940 [age] y.o. M) Treating RN: Phillis HaggisPinkerton, Debi Primary Care Provider: Angus PalmsGEORGE, SIONNE Other Clinician: Referring Provider: Angus PalmsGEORGE, SIONNE Treating Provider/Extender: Kathreen Cosieroulter, Calob Baskette Weeks in Treatment: 3 Verbal / Phone Orders: Yes Clinician: Pinkerton, Debi Read Back and Verified: Yes Diagnosis Coding Wound Cleansing o Clean wound with Normal Saline. o Cleanse wound with mild soap and water Anesthetic (add to Medication List) o Topical Lidocaine 4% cream applied to wound bed prior to debridement (In Clinic Only). Skin Barriers/Peri-Wound Care o Moisturizing lotion - not on wounds Primary Wound Dressing o Mepitel One Contact layer - Place over blisters (bilateral legs) Secondary Dressing o ABD pad - over the mepitel (bilateral legs) Dressing Change Frequency o Change dressing every week Follow-up Appointments o Return Appointment in 2 weeks. o Nurse Visit as needed Edema Control o 4 Layer Compression System - Bilateral - wrap 3 cm from toes to 3 cm from knee only wrap one layer at a time and do not overlap the same layer o Other: - after one week pt needs to wrap bilateral legs with ace wrap Additional Orders / Instructions o Increase protein intake. Home Health o Continue Home Health Visits o Home Health Nurse may visit PRN to address patientos wound care needs. o FACE TO FACE ENCOUNTER: MEDICARE and MEDICAID PATIENTS: I certify that this patient is under my care and that I had a face-to-face encounter that meets the physician face-to-face encounter requirements  with this patient on this date. The encounter with the patient was in whole or in part for the following MEDICAL CONDITION: (primary reason for Home Healthcare) MEDICAL NECESSITY: I certify, that based on my findings, NURSING services are a medically necessary home health service. HOME BOUND STATUS: I certify that my clinical findings support that this patient is homebound (i.e., Due to illness or injury, pt requires aid of supportive devices such as crutches, cane, wheelchairs, walkers, the use of special transportation or the assistance of another person to leave their place of residence. There is a normal inability to leave the home and doing so requires considerable and taxing effort. Other absences are for medical reasons / religious services and are infrequent or of short duration when for other reasons). o If current dressing causes regression in wound condition, may D/C ordered dressing product/s and apply Normal Saline Moist Dressing daily until next Wound Healing Center / Other MD appointment. Notify Wound Healing Center of regression in wound condition at [phone]. o Please direct any NON-WOUND related issues/requests for orders to patient's Primary Care Physician Carlos IrishJENKINS, Rjay ([id number]) Patient Medications Allergies: Mucinex Notifications Medication Indication Start End lidocaine DOSE 1 - topical 4 % cream - 1 cream topical Electronic Signature(s) Signed: 11/16/2017 4:46:33 PM By: Alejandro MullingPinkerton, Debra Signed: 11/16/2017 5:08:35 PM By: Bonnell Publicoulter, Rotunda Worden Entered By: Alejandro MullingPinkerton, Debra on 11/16/2017 15:11:16 Carlos IrishJENKINS, Kendale ([id number]) -------------------------------------------------------------------------------- Prescription 11/16/2017 Patient Name: Carlos IrishJENKINS, Emrik Provider: Bonnell Publicoulter, Jiyah Torpey NP Date of Birth: 07-02-1940 NPI#: [id number] Sex: M DEA#: [id number] Phone #: [phone] License #: Patient Address: [facility] [address]  [facility] [place], [place] [address] [address], [address] [place], [place] [address] [phone] Allergies Mucinex Medication Medication: Route: Strength: Form: lidocaine topical 4% cream Class: TOPICAL LOCAL ANESTHETICS Dose: Frequency / Time: Indication: 1 1 cream topical Number of Refills: Number of Units: 0 Generic Substitution: Start Date: End Date: Administered at Substitution Permitted Facility: Yes Time Administered: Time Discontinued: Note to Pharmacy: Signature(s): Date(s): Electronic Signature(s) Signed: 11/16/2017 4:46:33 PM By: Alejandro MullingPinkerton, Debra  Signed: 11/16/2017 5:08:35 PM By: Bonnell Publicoulter, Deanie Jupiter Entered By: Alejandro MullingPinkerton, Debra on 11/16/2017 15:11:17 Carlos IrishJENKINS, Lennell ([id number]) Carlos IrishJENKINS, Sinjin ([id number]) --------------------------------------------------------------------------------  Problem List Details Patient Name: Carlos IrishJENKINS, Tiyon Date of Service: 11/16/2017 2:15 PM Medical Record Number: [id number] Patient Account Number: [id number] Date of Birth/Sex: 1940/04/29 (76 y.o. 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 10/26/2017 No Yes S80.821A Blister (nonthermal), right lower leg, initial encounter 11/16/2017 No Yes S80.822A Blister (nonthermal), left lower leg, initial encounter 11/16/2017 No Yes R60.9 Edema, unspecified 10/26/2017 No Yes Inactive Problems Resolved Problems ICD-10 Code Description Active Date Resolved Date L97.211 Non-pressure chronic ulcer of right calf limited to breakdown of skin 10/26/2017 10/26/2017 L97.211 Non-pressure chronic ulcer of right calf limited to breakdown of skin 10/26/2017 10/26/2017 L97.221 Non-pressure  chronic ulcer of left calf limited to breakdown of skin 10/26/2017 10/26/2017 Electronic Signature(s) Signed: 11/16/2017 3:26:56 PM By: Bonnell Publicoulter, Debralee Braaksma Entered By: Bonnell Publicoulter, Higinio Grow on 11/16/2017 15:26:55 Carlos IrishJENKINS, Donnivan ([id number]) -------------------------------------------------------------------------------- Progress Note Details Patient Name: Carlos IrishJENKINS, Yoseph Date of Service: 11/16/2017 2:15 PM Medical Record Number: [id number] Patient Account Number: [id number] Date of Birth/Sex: 1940/04/29 (77 y.o. M) Treating RN: Phillis HaggisPinkerton, Debi Primary Care Provider: Angus PalmsGEORGE, SIONNE Other Clinician: Referring Provider: Angus PalmsGEORGE, SIONNE Treating Provider/Extender: Kathreen Cosieroulter, Atalie Oros Weeks in Treatment: 3 Subjective Chief Complaint Information obtained from Patient BLE ulcers History of Present Illness (HPI) 10/26/17-He presents for initial evaluation of bilateral lower extremity ulcers. He has been wearing an Unna boot compression over the last several months with minimal improvement in ulcerations. He does give a vague and somewhat confusing history regarding his lower extremity ulcers; unable to do identify if these were traumatic in nature, originally blisters, and more exact timeframe. He has not been evaluated by vascular medicine, he has elevated ABIs bilaterally in the office and will be referred to vascular medicine for evaluation. He has palpable dorsalis pedis and posttib bilaterally but we are unable to increase his compression d/t unreliable ABI results. In my opinion he would tolerate 3-4 layer compression and would have more benefit than the unna boots. We will initiate topical treatment as some of the injuries appear traumatic with removal of dry unna wrap. He will follow up next week. He has been hospitalized with lower extremity cellulitis in the recent and remote past, most recently in March and discharged on Rocephin. He is currently on no antibiotic therapy and presents with no indication  of cellulitis 11/02/17-He presents in follow up evaluation for bilateral lower extremity ulcers. He has not had unna boots, or any form of compression since last weeks appointment d/t no supplies by home health. He has arterial studies ordered for 7/29. We will continue with hydrofera blue, ace wrap and unna boots per home health 11/09/17-He presents in follow-up evaluation for bilateral lower extremity ulcers. Arterial studies performed on 7/29 revealed bilateral ABI 1.1-1.12 with bilateral TBI 0.9 and triphasic flow to the bilateral anterior tibial and posterior tibial; venous reflux studies were also performed but results are unavailable. He is improved, we will submit for compression wraps; we will continue with the same treatment plan, increasing compression to 4-layer compression wraps. He will follow up next week 11/16/17-He presents in follow-up evaluation for bilateral lower extremity ulcers. The original ulcerations have healed. There are new blisters to the right pre-tibial and left lateral leg;  home health doubled compression layers and the ankle/foot layers separated/rolled. We will place 4 layer compression today, this will maintain for one week. His wife is to apply ace wrap next Thursday and the following Monday as they will be out of town. She has been encouraged to contact us with any concerns/questions/intolerances. The patient will follow up in two weeks Venous studies reviewed revealing no evidence of venous insufficiency bilaterally. Objective Constitutional Vitals Time Taken: 2:30 PM, Height: 72 in, Weight: 252 lbs, BMI: 34.2, Temperature: 97.9 F, Pulse: 62 bpm, Respiratory Rate: 18 breaths/min, Blood Pressure: 114/62 mmHg. Integumentary (Hair, Skin) Wound #1R status is Healed - Epithelialized. Original cause of wound was Blister. The wound is located on the Left,Anterior [place], [place] ([id number]) Lower Leg. The wound measures 0cm length x 0cm width x 0cm depth; 0cm^2  area and 0cm^3 volume. There is no tunneling or undermining noted. There is a none present amount of drainage noted. The wound margin is flat and intact. There is no granulation within the wound bed. There is no necrotic tissue within the wound bed. The periwound skin appearance exhibited: Erythema. The surrounding wound skin color is noted with erythema which is circumferential. Wound #3 status is Healed - Epithelialized. Original cause of wound was Blister. The wound is located on the Right,Anterior Lower Leg. The wound measures 0cm length x 0cm width x 0cm depth; 0cm^2 area and 0cm^3 volume. There is no tunneling or undermining noted. There is a none present amount of drainage noted. The wound margin is flat and intact. There is no granulation within the wound bed. There is no necrotic tissue within the wound bed. The periwound skin appearance exhibited: Erythema. The periwound skin appearance did not exhibit: Callus, Crepitus, Excoriation, Induration, Rash, Scarring, Dry/Scaly, Maceration, Atrophie Blanche, Cyanosis, Ecchymosis, Hemosiderin Staining, Mottled, Pallor, Rubor. The surrounding wound skin color is noted with erythema which is circumferential. Periwound temperature was noted as No Abnormality. The periwound has tenderness on palpation. Wound #5 status is Healed - Epithelialized. Original cause of wound was Blister. The wound is located on the Right,Distal,Lateral Lower Leg. The wound measures 0cm length x 0cm width x 0cm depth; 0cm^2 area and 0cm^3 volume. There is no tunneling or undermining noted. There is a medium amount of serous drainage noted. The wound margin is flat and intact. There is no granulation within the wound bed. There is no necrotic tissue within the wound bed. The periwound skin appearance exhibited: Erythema. The periwound skin appearance did not exhibit: Callus, Crepitus, Excoriation, Induration, Rash, Scarring, Dry/Scaly, Maceration, Atrophie Blanche, Cyanosis,  Ecchymosis, Hemosiderin Staining, Mottled, Pallor, Rubor. The surrounding wound skin color is noted with erythema which is circumferential. Periwound temperature was noted as No Abnormality. The periwound has tenderness on palpation. 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 Cleanse wound with mild soap and water Anesthetic (add to Medication List): Topical Lidocaine 4% cream applied to wound bed prior to debridement (In Clinic Only). Skin Barriers/Peri-Wound Care: Moisturizing lotion - not on wounds Primary Wound Dressing: Mepitel One Contact layer - Place over blisters (bilateral legs) Secondary Dressing: ABD pad - over the mepitel (bilateral legs) Dressing Change Frequency: Change dressing every week Follow-up Appointments: Return Appointment in 2 weeks. COLAN, LAYMON ([id number]) Nurse Visit as needed Edema Control: 4 Layer Compression System - Bilateral - wrap 3 cm from  toes to 3 cm from knee only wrap one layer at a time and do not overlap the same layer Other: - after one week pt needs to wrap bilateral legs with ace wrap Additional Orders / Instructions: Increase protein intake. Home Health: Continue Home Health Visits Home Health Nurse may visit PRN to address patient s wound care needs. FACE TO FACE ENCOUNTER: MEDICARE and MEDICAID PATIENTS: I certify that this patient is under my care and that I had a face-to-face encounter that meets the physician face-to-face encounter requirements with this patient on this date. The encounter with the patient was in whole or in part for the following MEDICAL CONDITION: (primary reason for Home Healthcare) MEDICAL NECESSITY: I certify, that based on my findings, NURSING services are a medically  necessary home health service. HOME BOUND STATUS: I certify that my clinical findings support that this patient is homebound (i.e., Due to illness or injury, pt requires aid of supportive devices such as crutches, cane, wheelchairs, walkers, the use of special transportation or the assistance of another person to leave their place of residence. There is a normal inability to leave the home and doing so requires considerable and taxing effort. Other absences are for medical reasons / religious services and are infrequent or of short duration when for other reasons). If current dressing causes regression in wound condition, may D/C ordered dressing product/s and apply Normal Saline Moist Dressing daily until next Wound Healing Center / Other MD appointment. Notify Wound Healing Center of regression in wound condition at [phone]. Please direct any NON-WOUND related issues/requests for orders to patient's Primary Care Physician The following medication(s) was prescribed: lidocaine topical 4 % cream 1 1 cream topical was prescribed at facility Electronic Signature(s) Signed: 11/16/2017 3:30:31 PM By: Bonnell Public Entered By: Bonnell Public on 11/16/2017 15:30:30 Carlos Roach ([id number]) -------------------------------------------------------------------------------- SuperBill Details Patient Name: Carlos Roach Date of Service: 11/16/2017 Medical Record Number: [id number] Patient Account Number: [id number] Date of Birth/Sex: February 07, 1941 (76 y.o. 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

## 2017-11-30 ENCOUNTER — Encounter: Payer: Medicare Other | Admitting: Nurse Practitioner

## 2017-11-30 DIAGNOSIS — L97211 Non-pressure chronic ulcer of right calf limited to breakdown of skin: Secondary | ICD-10-CM | POA: Diagnosis not present

## 2017-12-07 DIAGNOSIS — L97211 Non-pressure chronic ulcer of right calf limited to breakdown of skin: Secondary | ICD-10-CM | POA: Diagnosis not present

## 2017-12-09 NOTE — Progress Notes (Signed)
LECIL, TAPP ([id number]) Visit Report for 11/30/2017 Arrival Information Details Patient Name: Carlos Roach, Carlos Roach Date of Service: 11/30/2017 10:15 AM Medical Record Number: [id number] Patient Account Number: [id number] Date of Birth/Sex: 1940/05/18 (77 y.o. M) Treating RN: Renne Crigler Primary Care Enda Santo: Angus Palms Other Clinician: Referring Rondrick Barreira: Angus Palms Treating Lotus Santillo/Extender: Kathreen Cosier in Treatment: 5 Visit Information History Since Last Visit All ordered tests and consults were completed: No Patient Arrived: Dan Humphreys Added or deleted any medications: No Arrival Time: 10:32 Any new allergies or adverse reactions: No Accompanied By: self Had a fall or experienced change in No Transfer Assistance: None activities of daily living that may affect Patient Identification Verified: Yes risk of falls: Secondary Verification Process Yes Signs or symptoms of abuse/neglect since last visito No Completed: Hospitalized since last visit: No Patient Has Alerts: Yes Implantable device outside of the clinic excluding No Patient Alerts: Patient on Blood cellular tissue based products placed in the center Thinner since last visit: Eliquis Pain Present Now: No Bilateral ABI 1.2 Bilateral TBI 0.9 Electronic Signature(s) Signed: 11/30/2017 4:42:32 PM By: Renne Crigler Entered By: Renne Crigler on 11/30/2017 10:33:39 Carlos Roach ([id number]) -------------------------------------------------------------------------------- Encounter Discharge Information Details Patient Name: Carlos Roach Date of Service: 11/30/2017 10:15 AM Medical Record Number: [id number] Patient Account Number: [id number] Date of Birth/Sex: [birth year] (76 y.o. M) Treating RN: Renne Crigler Primary Care Cordie Beazley: Angus Palms Other Clinician: Referring Alyss Granato: Angus Palms Treating Kim Oki/Extender: Kathreen Cosier in Treatment: 5 Encounter Discharge  Information Items Discharge Condition: Stable Ambulatory Status: Walker Discharge Destination: Home Transportation: Private Auto Schedule Follow-up Appointment: Yes Clinical Summary of Care: Electronic Signature(s) Signed: 11/30/2017 4:42:32 PM By: Renne Crigler Entered By: Renne Crigler on 11/30/2017 11:36:07 Carlos Roach ([id number]) -------------------------------------------------------------------------------- Lower Extremity Assessment Details Patient Name: Carlos Roach Date of Service: 11/30/2017 10:15 AM Medical Record Number: [id number] Patient Account Number: [id number] Date of Birth/Sex: 08/08/1940 (76 y.o. M) Treating RN: Renne Crigler Primary Care Kathee Tumlin: Angus Palms Other Clinician: Referring Ardyn Forge: Angus Palms Treating Tyrin Herbers/Extender: Kathreen Cosier in Treatment: 5 Edema Assessment Assessed: [Left: No] [Right: No] Edema: [Left: Yes] [Right: Yes] Calf Left: Right: Point of Measurement: 36 cm From Medial Instep 35.5 cm 36.5 cm Ankle Left: Right: Point of Measurement: 14 cm From Medial Instep 24 cm 23.2 cm Vascular Assessment Claudication: Claudication Assessment [Left:None] [Right:None] Pulses: Dorsalis Pedis Palpable: [Left:Yes] [Right:Yes] Posterior Tibial Extremity colors, hair growth, and conditions: Extremity Color: [Left:Hyperpigmented] [Right:Hyperpigmented] Hair Growth on Extremity: [Left:Yes] [Right:Yes] Temperature of Extremity: [Left:Warm] [Right:Warm] Capillary Refill: [Left:< 3 seconds] [Right:< 3 seconds] Toe Nail Assessment Left: Right: Thick: Yes Yes Discolored: Yes Yes Deformed: Yes Yes Improper Length and Hygiene: Yes Yes Notes heel to knee- 50cm Electronic Signature(s) Signed: 11/30/2017 11:20:23 AM By: Alejandro Mulling Signed: 11/30/2017 4:42:32 PM By: Renne Crigler Entered By: Alejandro Mulling on 11/30/2017 11:20:22 Carlos Roach  ([id number]) -------------------------------------------------------------------------------- Multi Wound Chart Details Patient Name: Carlos Roach Date of Service: 11/30/2017 10:15 AM Medical Record Number: [id number] Patient Account Number: [id number] Date of Birth/Sex: [birth year] (77 y.o. M) Treating RN: Phillis Haggis Primary Care Mckell Riecke: Angus Palms Other Clinician: Referring Deonta Bomberger: Angus Palms Treating Jatziri Goffredo/Extender: Kathreen Cosier in Treatment: 5 Vital Signs Height(in): 72 Pulse(bpm): 62 Weight(lbs): 252 Blood Pressure(mmHg): 120/68 Body Mass Index(BMI): 34 Temperature(F): 97.9 Respiratory Rate 18 (breaths/min): Photos: [6:No Photos] [7:No Photos] [8:No Photos] Wound Location: [6:Right Lower Leg - Medial, Posterior] [7:Right Foot - Lateral] [8:Right Foot - Dorsal] Wounding Event: [6:Gradually Appeared] [7:Gradually Appeared] [8:Gradually Appeared] Primary Etiology: [6:Lymphedema] [7:Lymphedema] [8:Lymphedema] Comorbid History: [6:Sleep Apnea, Arrhythmia,  Congestive Heart Failure, Hypertension, Peripheral Venous Disease] [7:Sleep Apnea, Arrhythmia, Congestive Heart Failure, Hypertension, Peripheral Venous Disease] [8:Sleep Apnea, Arrhythmia, Congestive Heart  Failure, Hypertension, Peripheral Venous Disease] Date Acquired: [6:11/30/2017] [7:11/30/2017] [8:11/30/2017] Weeks of Treatment: [6:0] [7:0] [8:0] Wound Status: [6:Open] [7:Open] [8:Open] Measurements L x W x D [6:2x1.5x0.1] [7:1x1.5x0.1] [8:1.1x0.5x0.1] (cm) Area (cm) : [6:2.356] [7:1.178] [8:0.432] Volume (cm) : [6:0.236] [7:0.118] [8:0.043] Classification: [6:Full Thickness Without Exposed Support Structures] [7:Partial Thickness] [8:Partial Thickness] Exudate Amount: [6:Large] [7:None Present] [8:None Present] Exudate Type: [6:Sanguinous] [7:N/A] [8:N/A] Exudate Color: [6:red] [7:N/A] [8:N/A] Wound Margin: [6:Flat and Intact] [7:Distinct, outline attached] [8:Distinct, outline  attached] Granulation Amount: [6:Large (67-100%)] [7:None Present (0%)] [8:None Present (0%)] Granulation Quality: [6:Red, Pink] [7:N/A] [8:N/A] Necrotic Amount: [6:None Present (0%)] [7:Large (67-100%)] [8:Large (67-100%)] Necrotic Tissue: [6:N/A] [7:Eschar] [8:Eschar] Exposed Structures: [6:Fascia: No Fat Layer (Subcutaneous Tissue) Exposed: No Tendon: No Muscle: No Joint: No Bone: No] [7:Fascia: No Fat Layer (Subcutaneous Tissue) Exposed: No Tendon: No Muscle: No Joint: No Bone: No] [8:Fascia: No Fat Layer (Subcutaneous Tissue) Exposed:  No Tendon: No Muscle: No Joint: No Bone: No] Epithelialization: [6:None] [7:None] [8:None] Periwound Skin Texture: [6:Excoriation: No Induration: No Callus: No] [7:Excoriation: No Induration: No Callus: No] [8:Excoriation: No Induration: No Callus: No] Crepitus: No Crepitus: No Crepitus: No Rash: No Rash: No Rash: No Scarring: No Scarring: No Scarring: No Periwound Skin Moisture: Maceration: No Maceration: No Maceration: No Dry/Scaly: No Dry/Scaly: No Dry/Scaly: No Periwound Skin Color: Atrophie Blanche: No Atrophie Blanche: No Atrophie Blanche: No Cyanosis: No Cyanosis: No Cyanosis: No Ecchymosis: No Ecchymosis: No Ecchymosis: No Erythema: No Erythema: No Erythema: No Hemosiderin Staining: No Hemosiderin Staining: No Hemosiderin Staining: No Mottled: No Mottled: No Mottled: No Pallor: No Pallor: No Pallor: No Rubor: No Rubor: No Rubor: No Temperature: No Abnormality No Abnormality N/A Tenderness on Palpation: Yes Yes No Wound Preparation: Ulcer Cleansing: Ulcer Cleansing: Ulcer Cleansing: Rinsed/Irrigated with Saline Rinsed/Irrigated with Saline Rinsed/Irrigated with Saline Topical Anesthetic Applied: Topical Anesthetic Applied: Topical Anesthetic Applied: Other: lidocaine 4% Other: lidocaine 4% Other: lidocaine 4% Treatment Notes Electronic Signature(s) Signed: 12/01/2017 3:54:45 PM By: Alejandro Mulling Entered  By: Alejandro Mulling on 11/30/2017 11:07:52 Carlos Roach ([id number]) -------------------------------------------------------------------------------- Multi-Disciplinary Care Plan Details Patient Name: Carlos Roach Date of Service: 11/30/2017 10:15 AM Medical Record Number: [id number] Patient Account Number: [id number] Date of Birth/Sex: 1940/08/29 (76 y.o. M) Treating RN: Phillis Haggis Primary Care Jazmina Muhlenkamp: Angus Palms Other Clinician: Referring Sakari Raisanen: Angus Palms Treating Maylie Ashton/Extender: Kathreen Cosier in Treatment: 5 Active Inactive ` Abuse / Safety / Falls / Self Care Management Nursing Diagnoses: Potential for falls Goals: Patient will not experience any injury related to falls Date Initiated: 10/26/2017 Target Resolution Date: 02/17/2018 Goal Status: Active Interventions: Assess Activities of Daily Living upon admission and as needed Assess fall risk on admission and as needed Assess: immobility, friction, shearing, incontinence upon admission and as needed Assess impairment of mobility on admission and as needed per policy Notes: ` Nutrition Nursing Diagnoses: Imbalanced nutrition Potential for alteratiion in Nutrition/Potential for imbalanced nutrition Goals: Patient/caregiver agrees to and verbalizes understanding of need to use nutritional supplements and/or vitamins as prescribed Date Initiated: 10/26/2017 Target Resolution Date: 02/17/2018 Goal Status: Active Interventions: Assess patient nutrition upon admission and as needed per policy Notes: ` Orientation to the Wound Care Program Nursing Diagnoses: Knowledge deficit related to the wound healing center program Goals: Patient/caregiver will verbalize understanding of the Wound Healing Center Program Carlos Roach, Carlos Roach ([id number]) Date Initiated: 10/26/2017 Target Resolution Date: 11/18/2017 Goal Status: Active Interventions: Provide education  on orientation to the wound  center Notes: ` Wound/Skin Impairment Nursing Diagnoses: Impaired tissue integrity Knowledge deficit related to ulceration/compromised skin integrity Goals: Ulcer/skin breakdown will have a volume reduction of 80% by week 12 Date Initiated: 10/26/2017 Target Resolution Date: 02/17/2018 Goal Status: Active Interventions: Assess patient/caregiver ability to perform ulcer/skin care regimen upon admission and as needed Assess ulceration(s) every visit Notes: Electronic Signature(s) Signed: 12/01/2017 3:54:45 PM By: Alejandro Mulling Entered By: Alejandro Mulling on 11/30/2017 11:07:29 Carlos Roach ([id number]) -------------------------------------------------------------------------------- Pain Assessment Details Patient Name: Carlos Roach Date of Service: 11/30/2017 10:15 AM Medical Record Number: [id number] Patient Account Number: [id number] Date of Birth/Sex: March 02, 1941 (77 y.o. M) Treating RN: Renne Crigler Primary Care Gee Habig: Angus Palms Other Clinician: Referring Frenchie Dangerfield: Angus Palms Treating Cherese Lozano/Extender: Kathreen Cosier in Treatment: 5 Active Problems Location of Pain Severity and Description of Pain Patient Has Paino No Site Locations Pain Management and Medication Current Pain Management: Electronic Signature(s) Signed: 11/30/2017 4:42:32 PM By: Renne Crigler Entered By: Renne Crigler on 11/30/2017 10:33:46 Carlos Roach ([id number]) -------------------------------------------------------------------------------- Patient/Caregiver Education Details Patient Name: Carlos Roach Date of Service: 11/30/2017 10:15 AM Medical Record Number: [id number] Patient Account Number: [id number] Date of Birth/Gender: December 12, 1940 (76 y.o. M) Treating RN: Renne Crigler Primary Care Physician: Angus Palms Other Clinician: Referring Physician: Angus Palms Treating Physician/Extender: Kathreen Cosier in Treatment: 5 Education  Assessment Education Provided To: Patient Education Topics Provided Wound Debridement: Handouts: Wound Debridement Methods: Explain/Verbal Responses: State content correctly Wound/Skin Impairment: Handouts: Caring for Your Ulcer Methods: Explain/Verbal Responses: State content correctly Electronic Signature(s) Signed: 11/30/2017 4:42:32 PM By: Renne Crigler Entered By: Renne Crigler on 11/30/2017 11:36:24 Carlos Roach ([id number]) -------------------------------------------------------------------------------- Wound Assessment Details Patient Name: Carlos Roach Date of Service: 11/30/2017 10:15 AM Medical Record Number: [id number] Patient Account Number: [id number] Date of Birth/Sex: [birth year] (76 y.o. M) Treating RN: Renne Crigler Primary Care Daionna Crossland: Angus Palms Other Clinician: Referring Jon Lall: Angus Palms Treating Linford Quintela/Extender: Kathreen Cosier in Treatment: 5 Wound Status Wound Number: 6 Primary Lymphedema Etiology: Wound Location: Right Lower Leg - Medial, Posterior Wound Open Wounding Event: Gradually Appeared Status: Date Acquired: 11/30/2017 Comorbid Sleep Apnea, Arrhythmia, Congestive Heart Weeks Of Treatment: 0 History: Failure, Hypertension, Peripheral Venous Clustered Wound: No Disease Photos Photo Uploaded By: Renne Crigler on 11/30/2017 11:40:34 Wound Measurements Length: (cm) 2 Width: (cm) 1.5 Depth: (cm) 0.1 Area: (cm) 2.356 Volume: (cm) 0.236 % Reduction in Area: % Reduction in Volume: Epithelialization: None Tunneling: No Undermining: No Wound Description Full Thickness Without Exposed Support Classification: Structures Wound Margin: Flat and Intact Exudate Large Amount: Exudate Type: Sanguinous Exudate Color: red Foul Odor After Cleansing: No Slough/Fibrino No Wound Bed Granulation Amount: Large (67-100%) Exposed Structure Granulation Quality: Red, Pink Fascia Exposed: No Necrotic Amount:  None Present (0%) Fat Layer (Subcutaneous Tissue) Exposed: No Tendon Exposed: No Muscle Exposed: No Joint Exposed: No Bone Exposed: No Carlos Roach, Carlos Roach ([id number]) Periwound Skin Texture Texture Color No Abnormalities Noted: No No Abnormalities Noted: No Callus: No Atrophie Blanche: No Crepitus: No Cyanosis: No Excoriation: No Ecchymosis: No Induration: No Erythema: No Rash: No Hemosiderin Staining: No Scarring: No Mottled: No Pallor: No Moisture Rubor: No No Abnormalities Noted: No Dry / Scaly: No Temperature / Pain Maceration: No Temperature: No Abnormality Tenderness on Palpation: Yes Wound Preparation Ulcer Cleansing: Rinsed/Irrigated with Saline Topical Anesthetic Applied: Other: lidocaine 4%, Electronic Signature(s) Signed: 11/30/2017 4:42:32 PM By: Renne Crigler Entered By: Renne Crigler on 11/30/2017 10:48:10 Carlos Roach ([id number]) -------------------------------------------------------------------------------- Wound Assessment Details Patient Name: Carlos Roach Date of Service: 11/30/2017  10:15 AM Medical Record Number: [id number] Patient Account Number: [id number] Date of Birth/Sex: 28-Jan-1941 35(76 y.o. M) Treating RN: Renne CriglerFlinchum, Cheryl Primary Care Anselm Aumiller: Angus PalmsGEORGE, SIONNE Other Clinician: Referring Doshie Maggi: Angus PalmsGEORGE, SIONNE Treating Tangelia Sanson/Extender: Kathreen Cosieroulter, Leah Weeks in Treatment: 5 Wound Status Wound Number: 7 Primary Lymphedema Etiology: Wound Location: Right Foot - Lateral Wound Open Wounding Event: Gradually Appeared Status: Date Acquired: 11/30/2017 Comorbid Sleep Apnea, Arrhythmia, Congestive Heart Weeks Of Treatment: 0 History: Failure, Hypertension, Peripheral Venous Clustered Wound: No Disease Photos Photo Uploaded By: Renne CriglerFlinchum, Cheryl on 11/30/2017 11:40:35 Wound Measurements Length: (cm) 1 Width: (cm) 1.5 Depth: (cm) 0.1 Area: (cm) 1.178 Volume: (cm) 0.118 % Reduction in Area: % Reduction in  Volume: Epithelialization: None Tunneling: No Undermining: No Wound Description Classification: Partial Thickness Wound Margin: Distinct, outline attached Exudate Amount: None Present Foul Odor After Cleansing: No Slough/Fibrino Yes Wound Bed Granulation Amount: None Present (0%) Exposed Structure Necrotic Amount: Large (67-100%) Fascia Exposed: No Necrotic Quality: Eschar Fat Layer (Subcutaneous Tissue) Exposed: No Tendon Exposed: No Muscle Exposed: No Joint Exposed: No Bone Exposed: No Periwound Skin Texture Texture Color No Abnormalities Noted: No No Abnormalities Noted: No Carlos IrishJENKINS, Carlos Roach ([id number]) Callus: No Atrophie Blanche: No Crepitus: No Cyanosis: No Excoriation: No Ecchymosis: No Induration: No Erythema: No Rash: No Hemosiderin Staining: No Scarring: No Mottled: No Pallor: No Moisture Rubor: No No Abnormalities Noted: No Dry / Scaly: No Temperature / Pain Maceration: No Temperature: No Abnormality Tenderness on Palpation: Yes Wound Preparation Ulcer Cleansing: Rinsed/Irrigated with Saline Topical Anesthetic Applied: Other: lidocaine 4%, Treatment Notes Wound #7 (Right, Lateral Foot) 1. Cleansed with: Clean wound with Normal Saline 2. Anesthetic Topical Lidocaine 4% cream to wound bed prior to debridement 4. Dressing Applied: Hydrafera Blue 5. Secondary Dressing Applied Dry Gauze 7. Secured with 4 Layer Compression System - Bilateral Notes unna to Ecologist[employer] Electronic Signature(s) Signed: 11/30/2017 4:42:32 PM By: Renne CriglerFlinchum, Cheryl Entered By: Renne CriglerFlinchum, Cheryl on 11/30/2017 10:49:58 Carlos IrishJENKINS, Carlos Roach ([id number]) -------------------------------------------------------------------------------- Wound Assessment Details Patient Name: Carlos IrishJENKINS, Carlos Roach Date of Service: 11/30/2017 10:15 AM Medical Record Number: [id number] Patient Account Number: [id number] Date of Birth/Sex: 28-Jan-1941 (76 y.o. M) Treating RN: Renne CriglerFlinchum, Cheryl Primary Care  Kwali Wrinkle: Angus PalmsGEORGE, SIONNE Other Clinician: Referring Cashton Hosley: Angus PalmsGEORGE, SIONNE Treating Moris Ratchford/Extender: Kathreen Cosieroulter, Leah Weeks in Treatment: 5 Wound Status Wound Number: 8 Primary Lymphedema Etiology: Wound Location: Right Lower Leg - Dorsal, Anterior Wound Open Wounding Event: Gradually Appeared Status: Date Acquired: 11/30/2017 Comorbid Sleep Apnea, Arrhythmia, Congestive Heart Weeks Of Treatment: 0 History: Failure, Hypertension, Peripheral Venous Clustered Wound: No Disease Photos Photo Uploaded By: Renne CriglerFlinchum, Cheryl on 11/30/2017 11:40:58 Wound Measurements Length: (cm) 1.6 Width: (cm) 0.6 Depth: (cm) 0.1 Area: (cm) 0.754 Volume: (cm) 0.075 % Reduction in Area: -74.5% % Reduction in Volume: -74.4% Epithelialization: None Tunneling: No Undermining: No Wound Description Classification: Partial Thickness Wound Margin: Distinct, outline attached Exudate Amount: None Present Foul Odor After Cleansing: No Slough/Fibrino Yes Wound Bed Granulation Amount: Large (67-100%) Exposed Structure Granulation Quality: Red Fascia Exposed: No Necrotic Amount: None Present (0%) Fat Layer (Subcutaneous Tissue) Exposed: No Tendon Exposed: No Muscle Exposed: No Joint Exposed: No Bone Exposed: No Periwound Skin Texture Texture Color No Abnormalities Noted: No No Abnormalities Noted: No Carlos IrishJENKINS, Carlos Roach ([id number]) Callus: No Atrophie Blanche: No Crepitus: No Cyanosis: No Excoriation: No Ecchymosis: No Induration: No Erythema: No Rash: No Hemosiderin Staining: No Scarring: No Mottled: No Pallor: No Moisture Rubor: No No Abnormalities Noted: No Dry / Scaly: No Maceration: No Wound Preparation Ulcer Cleansing: Rinsed/Irrigated with Saline Topical Anesthetic Applied: Other: lidocaine 4%,  Treatment Notes Wound #8 (Right, Dorsal, Anterior Lower Leg) 1. Cleansed with: Clean wound with Normal Saline 2. Anesthetic Topical Lidocaine 4% cream to wound bed prior to  debridement 4. Dressing Applied: Hydrafera Blue 5. Secondary Dressing Applied Dry Gauze 7. Secured with 4 Layer Compression System - Bilateral Notes unna to anchor Electronic Signature(s) Signed: 11/30/2017 4:42:32 PM By: Renne Crigler Signed: 12/01/2017 3:54:45 PM By: Alejandro Mulling Entered By: Alejandro Mulling on 11/30/2017 11:10:26 Carlos Roach ([id number]) -------------------------------------------------------------------------------- Vitals Details Patient Name: Carlos Roach Date of Service: 11/30/2017 10:15 AM Medical Record Number: [id number] Patient Account Number: [id number] Date of Birth/Sex: 1940-11-27 (77 y.o. M) Treating RN: Renne Crigler Primary Care Sayyid Harewood: Angus Palms Other Clinician: Referring Caileen Veracruz: Angus Palms Treating Frederica Chrestman/Extender: Kathreen Cosier in Treatment: 5 Vital Signs Time Taken: 10:33 Temperature (F): 97.9 Height (in): 72 Pulse (bpm): 62 Weight (lbs): 252 Respiratory Rate (breaths/min): 18 Body Mass Index (BMI): 34.2 Blood Pressure (mmHg): 120/68 Reference Range: 80 - 120 mg / dl Electronic Signature(s) Signed: 11/30/2017 4:42:32 PM By: Renne Crigler Entered By: Renne Crigler on 11/30/2017 10:34:02

## 2017-12-13 NOTE — Progress Notes (Signed)
RAYSEAN, GRAUMANN ([id number]) Visit Report for 11/30/2017 Chief Complaint Document Details Patient Name: Carlos Roach, Carlos Roach Date of Service: 11/30/2017 10:15 AM Medical Record Number: [id number] Patient Account Number: [id number] Date of Birth/Sex: 1940-11-25 (77 y.o. M) Treating RN: Phillis Haggis Primary Care Provider: Angus Palms Other Clinician: Referring Provider: Angus Palms Treating Provider/Extender: Kathreen Cosier in Treatment: 5 Information Obtained from: Patient Chief Complaint BLE ulcers Electronic Signature(s) Signed: 11/30/2017 11:16:45 AM By: Bonnell Public Entered By: Bonnell Public on 11/30/2017 11:16:45 Carlos Roach ([id number]) -------------------------------------------------------------------------------- Debridement Details Patient Name: Carlos Roach Date of Service: 11/30/2017 10:15 AM Medical Record Number: [id number] Patient Account Number: [id number] Date of Birth/Sex: 1940/04/27 (76 y.o. M) Treating RN: Phillis Haggis Primary Care Provider: Angus Palms Other Clinician: Referring Provider: Angus Palms Treating Provider/Extender: Kathreen Cosier in Treatment: 5 Debridement Performed for Wound #7 Right,Lateral Foot Assessment: Performed By: Physician Bonnell Public, NP Debridement Type: Debridement Pre-procedure Verification/Time Yes - 11:07 Out Taken: Start Time: 11:07 Pain Control: Lidocaine 4% Topical Solution Total Area Debrided (L x W): 1 (cm) x 1.5 (cm) = 1.5 (cm) Tissue and other material Non-Viable, Skin: Dermis , Fibrin/Exudate debrided: Level: Skin/Dermis Debridement Description: Selective/Open Wound Instrument: Curette Bleeding: Minimum Hemostasis Achieved: Pressure End Time: 11:08 Procedural Pain: 0 Post Procedural Pain: 0 Response to Treatment: Procedure was tolerated well Level of Consciousness: Awake and Alert Post Debridement Measurements of Total Wound Length: (cm) 1 Width: (cm) 1.5 Depth: (cm)  0.2 Volume: (cm) 0.236 Character of Wound/Ulcer Post Debridement: Stable Post Procedure Diagnosis Same as Pre-procedure Electronic Signature(s) Signed: 11/30/2017 11:16:37 AM By: Bonnell Public Signed: 12/01/2017 3:54:45 PM By: Alejandro Mulling Entered By: Bonnell Public on 11/30/2017 11:16:36 Carlos Roach ([id number]) -------------------------------------------------------------------------------- HPI Details Patient Name: Carlos Roach Date of Service: 11/30/2017 10:15 AM Medical Record Number: [id number] Patient Account Number: [id number] Date of Birth/Sex: [birth year] (76 y.o. M) Treating RN: Phillis Haggis Primary Care Provider: Angus Palms Other Clinician: Referring Provider: Angus Palms Treating Provider/Extender: Kathreen Cosier in Treatment: 5 History of Present Illness HPI Description: 10/26/17-He presents for initial evaluation of bilateral lower extremity ulcers. He has been wearing an Unna boot compression over the last several months with minimal improvement in ulcerations. He does give a vague and somewhat confusing history regarding his lower extremity ulcers; unable to do identify if these were traumatic in nature, originally blisters, and more exact timeframe. He has not been evaluated by vascular medicine, he has elevated ABIs bilaterally in the office and will be referred to vascular medicine for evaluation. He has palpable dorsalis pedis and posttib bilaterally but we are unable to increase his compression d/t unreliable ABI results. In my opinion he would tolerate 3-4 layer compression and would have more benefit than the unna boots. We will initiate topical treatment as some of the injuries appear traumatic with removal of dry unna wrap. He will follow up next week. He has been hospitalized with lower extremity cellulitis in the recent and remote past, most recently in March and discharged on Rocephin. He is currently on no antibiotic therapy and  presents with no indication of cellulitis 11/02/17-He presents in follow up evaluation for bilateral lower extremity ulcers. He has not had unna boots, or any form of compression since last weeks appointment d/t no supplies by home health. He has arterial studies ordered for 7/29. We will continue with hydrofera blue, ace wrap and unna boots per home health 11/09/17-He presents in follow-up evaluation for bilateral lower extremity ulcers. Arterial studies performed on 7/29 revealed bilateral ABI 1.1-1.12 with bilateral  TBI 0.9 and triphasic flow to the bilateral anterior tibial and posterior tibial; venous reflux studies were also performed but results are unavailable. He is improved, we will submit for compression wraps; we will continue with the same treatment plan, increasing compression to 4-layer compression wraps. He will follow up next week 11/16/17-He presents in follow-up evaluation for bilateral lower extremity ulcers. The original ulcerations have healed. There are new blisters to the right pre-tibial and left lateral leg; home health doubled compression layers and the ankle/foot layers separated/rolled. We will place 4 layer compression today, this will maintain for one week. His wife is to apply ace wrap next Thursday and the following Monday as they will be out of town. She has been encouraged to contact us with any concerns/questions/intolerances. The patient will follow up in two weeks Venous studies reviewed revealing no evidence of venous insufficiency bilaterally. 11/30/17- He presents in follow-up evaluation for bilateral lower extremity blisters. The left lower extremity is healed; the right lower extremity has partial thickness openings to the anterior and posterior aspect with area of dry discoloration to the right lateral foot, easily removed to reveal a partial thickness opening. We will apply compression therapy today, we will follow up next week for re-wrap and see me in two  weeks. Electronic Signature(s) Signed: 11/30/2017 11:19:05 AM By: Bonnell Public Entered By: Bonnell Public on 11/30/2017 11:19:05 Carlos Roach ([id number]) -------------------------------------------------------------------------------- Physician Orders Details Patient Name: Carlos Roach Date of Service: 11/30/2017 10:15 AM Medical Record Number: [id number] Patient Account Number: [id number] Date of Birth/Sex: 1940/08/02 (76 y.o. 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 o Cleanse wound with mild soap and water Wound #7 Right,Lateral Foot o Clean wound with Normal Saline. o Cleanse wound with mild soap and water Wound #8 Right,Dorsal,Anterior Lower Leg o Clean wound with Normal Saline. o Cleanse wound with mild soap and water Anesthetic (add to Medication List) Wound #6 Right,Posterior Lower Leg o Topical Lidocaine 4% cream applied to wound bed prior to debridement (In Clinic Only). Wound #7 Right,Lateral Foot o Topical Lidocaine 4% cream applied to wound bed prior to debridement (In Clinic Only). Wound #8 Right,Dorsal,Anterior Lower Leg o Topical Lidocaine 4% cream applied to wound bed prior to debridement (In Clinic Only). Skin Barriers/Peri-Wound Care Wound #6 Right,Posterior Lower Leg o Moisturizing lotion - not on wounds Wound #7  Right,Lateral Foot o Moisturizing lotion - not on wounds Wound #8 Right,Dorsal,Anterior Lower Leg o Moisturizing lotion - not on wounds Primary Wound Dressing Wound #6 Right,Posterior Lower Leg Carlos Roach, Carlos Roach ([id number]) o Mepitel One Contact layer - Place over blisters (bilateral legs) o Hydrafera Blue Ready Transfer Wound #7 Right,Lateral Foot o Mepitel One Contact layer - Place over blisters (bilateral legs) o Hydrafera Blue Ready Transfer Wound #8 Right,Dorsal,Anterior Lower Leg o Mepitel One Contact layer - Place over blisters (bilateral legs) o Hydrafera Blue Ready Transfer Secondary Dressing Wound #6 Right,Posterior Lower Leg o ABD pad Wound #7 Right,Lateral Foot o ABD pad Wound #8 Right,Dorsal,Anterior  Lower Leg o ABD pad Dressing Change Frequency Wound #6 Right,Posterior Lower Leg o Change dressing every week Wound #7 Right,Lateral Foot o Change dressing every week Wound #8 Right,Dorsal,Anterior Lower Leg o Change dressing every week Follow-up Appointments Wound #6 Right,Posterior Lower Leg o Return Appointment in 2 weeks. - provider appt in 2 weeks o Nurse Visit as needed - in one week Wound #7 Right,Lateral Foot o Return Appointment in 2 weeks. - provider appt in 2 weeks o Nurse Visit as needed - in one week Wound #8 Right,Dorsal,Anterior Lower Leg o Return Appointment in 2 weeks. - provider appt in 2 weeks o Nurse Visit as needed - in one week Edema Control o 4 Layer Compression System - Bilateral - wrap 3 cm from toes to 3 cm from knee with unna to anchor only wrap one layer at a time and do not overlap the same layer Left leg wrapped for lymphedema Additional Orders / Instructions Wound #6 Right,Posterior Lower Leg o Increase protein intake. Carlos Roach, Carlos Roach ([id number]) Wound #7 Right,Lateral Foot o Increase protein intake. Wound #8 Right,Dorsal,Anterior Lower Leg o Increase protein intake. Home  Health Wound #6 Right,Posterior Lower Leg o D/C Home Health Services Wound #7 Right,Lateral Foot o D/C Home Health Services Wound #8 Right,Dorsal,Anterior Lower Leg o D/C Home Health Services Patient Medications Allergies: Mucinex Notifications Medication Indication Start End lidocaine DOSE 1 - topical 4 % cream - 1 cream topical Electronic Signature(s) Signed: 12/12/2017 5:47:54 PM By: Bonnell Public Entered By: Bonnell Public on 11/30/2017 11:20:40 Carlos Roach ([id number]) -------------------------------------------------------------------------------- Prescription 11/30/2017 Patient Name: Carlos Roach Provider: Bonnell Public NP Date of Birth: 05/08/1940 NPI#: [id number] Sex: M DEA#: [id number] Phone #: [phone] License #: Patient Address: [facility] [address] [facility] [place], [place] [address] [address], [address] [place], [place] [address] [phone] Allergies Mucinex Medication Medication: Route: Strength: Form: lidocaine 4 % topical cream topical 4% cream Class: TOPICAL LOCAL ANESTHETICS Dose: Frequency / Time: Indication: 1 1 cream topical Number of Refills: Number of Units: 0 Generic Substitution: Start Date: End Date: One Time Use: Substitution Permitted No Note to Pharmacy: Signature(s): Date(s): Electronic Signature(s) Signed: 12/12/2017 5:47:54 PM By: Bonnell Public Entered By: Bonnell Public on 11/30/2017 11:20:41 Carlos Roach ([id number]) --------------------------------------------------------------------------------  Problem List Details Patient Name: Carlos Roach Date of Service: 11/30/2017 10:15 AM Medical Record Number: [id number] Patient Account Number: [id number] Date of Birth/Sex: 02-20-1941 (76 y.o. 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 11/30/2017 No Yes of skin L97.511 Non-pressure chronic ulcer of other part of right foot limited to 11/30/2017 No Yes breakdown of skin Inactive Problems Resolved Problems ICD-10 Code Description Active Date Resolved Date L97.211 Non-pressure chronic ulcer of right calf limited to breakdown of skin 10/26/2017 10/26/2017 L97.221 Non-pressure chronic ulcer of left calf limited to breakdown of skin 10/26/2017 10/26/2017 S80.821A Blister (nonthermal), right lower leg, initial encounter 11/16/2017 11/16/2017 E83.151V Blister (nonthermal), left lower leg, initial encounter 11/16/2017 11/16/2017 Electronic Signature(s) Signed: 11/30/2017 11:20:16 AM By: Bonnell Public Entered By: Bonnell Public on 11/30/2017 11:20:16 Carlos Roach ([id number]) Carlos Roach, Carlos Roach ([id number]) -------------------------------------------------------------------------------- Progress Note Details Patient Name: Carlos Roach Date of Service: 11/30/2017  10:15 AM Medical Record Number: [id number] Patient Account Number: [id number] Date of Birth/Sex: [birth year] (77 y.o. M) Treating RN: Phillis Haggis Primary Care Provider: Angus Palms Other Clinician: Referring Provider: Angus Palms Treating Provider/Extender: Kathreen Cosier in Treatment: 5 Subjective Chief Complaint Information obtained from Patient BLE ulcers History of Present Illness (HPI) 10/26/17-He presents for initial evaluation of bilateral lower extremity ulcers. He has been wearing an Unna boot compression over the last several months with minimal improvement in ulcerations. He does give a vague and somewhat confusing history regarding  his lower extremity ulcers; unable to do identify if these were traumatic in nature, originally blisters, and more exact timeframe. He has not been evaluated by vascular medicine, he has elevated ABIs bilaterally in the office and will be referred to vascular medicine for evaluation. He has palpable dorsalis pedis and posttib bilaterally but we are unable to increase his compression d/t unreliable ABI results. In my opinion he would tolerate 3-4 layer compression and would have more benefit than the unna boots. We will initiate topical treatment as some of the injuries appear traumatic with removal of dry unna wrap. He will follow up next week. He has been hospitalized with lower extremity cellulitis in the recent and remote past, most recently in March and discharged on Rocephin. He is currently on no antibiotic therapy and presents with no indication of cellulitis 11/02/17-He presents in follow up evaluation for bilateral lower extremity ulcers. He has not had unna boots, or any form of compression since last weeks appointment d/t no supplies by home health. He has arterial studies ordered for 7/29. We will continue with hydrofera blue, ace wrap and unna boots per home health 11/09/17-He presents in follow-up evaluation for bilateral lower extremity ulcers. Arterial studies performed on 7/29 revealed bilateral ABI 1.1-1.12 with bilateral TBI 0.9 and triphasic flow to the bilateral anterior tibial and posterior tibial; venous reflux studies were also performed but results are unavailable. He is improved, we will submit for compression wraps; we will continue with the same treatment plan, increasing compression to 4-layer compression wraps. He will follow up next week 11/16/17-He presents in follow-up evaluation for bilateral lower extremity ulcers. The original ulcerations have healed. There are new blisters to the right pre-tibial and left lateral leg; home health doubled compression layers and the  ankle/foot layers separated/rolled. We will place 4 layer compression today, this will maintain for one week. His wife is to apply ace wrap next Thursday and the following Monday as they will be out of town. She has been encouraged to contact us with any concerns/questions/intolerances. The patient will follow up in two weeks Venous studies reviewed revealing no evidence of venous insufficiency bilaterally. 11/30/17- He presents in follow-up evaluation for bilateral lower extremity blisters. The left lower extremity is healed; the right lower extremity has partial thickness openings to the anterior and posterior aspect with area of dry discoloration to the right lateral foot, easily removed to reveal a partial thickness opening. We will apply compression therapy today, we will follow up next week for re-wrap and see me in two weeks. Objective Constitutional Vitals Time Taken: 10:33 AM, Height: 72 in, Weight: 252 lbs, BMI: 34.2, Temperature: 97.9 F, Pulse: 62 bpm, Respiratory Rate: 18 breaths/min, Blood Pressure: 120/68 mmHg. Carlos Roach, Carlos Roach ([id number]) Integumentary (Hair, Skin) Wound #6 status is Open. Original cause of wound was Gradually Appeared. The wound is located on the Right,Posterior Lower Leg. The wound measures 2cm length x 1.5cm width x 0.1cm depth; 2.356cm^2 area and  0.236cm^3 volume. There is no tunneling or undermining noted. There is a large amount of sanguinous drainage noted. The wound margin is flat and intact. There is large (67-100%) red, pink granulation within the wound bed. There is no necrotic tissue within the wound bed. The periwound skin appearance did not exhibit: Callus, Crepitus, Excoriation, Induration, Rash, Scarring, Dry/Scaly, Maceration, Atrophie Blanche, Cyanosis, Ecchymosis, Hemosiderin Staining, Mottled, Pallor, Rubor, Erythema. Periwound temperature was noted as No Abnormality. The periwound has tenderness on palpation. Wound #7 status is Open.  Original cause of wound was Gradually Appeared. The wound is located on the Right,Lateral Foot. The wound measures 1cm length x 1.5cm width x 0.1cm depth; 1.178cm^2 area and 0.118cm^3 volume. There is no tunneling or undermining noted. There is a none present amount of drainage noted. The wound margin is distinct with the outline attached to the wound base. There is no granulation within the wound bed. There is a large (67-100%) amount of necrotic tissue within the wound bed including Eschar. The periwound skin appearance did not exhibit: Callus, Crepitus, Excoriation, Induration, Rash, Scarring, Dry/Scaly, Maceration, Atrophie Blanche, Cyanosis, Ecchymosis, Hemosiderin Staining, Mottled, Pallor, Rubor, Erythema. Periwound temperature was noted as No Abnormality. The periwound has tenderness on palpation. Wound #8 status is Open. Original cause of wound was Gradually Appeared. The wound is located on the Right,Dorsal,Anterior Lower Leg. The wound measures 1.6cm length x 0.6cm width x 0.1cm depth; 0.754cm^2 area and 0.075cm^3 volume. There is no tunneling or undermining noted. There is a none present amount of drainage noted. The wound margin is distinct with the outline attached to the wound base. There is large (67-100%) red granulation within the wound bed. There is no necrotic tissue within the wound bed. The periwound skin appearance did not exhibit: Callus, Crepitus, Excoriation, Induration, Rash, Scarring, Dry/Scaly, Maceration, Atrophie Blanche, Cyanosis, Ecchymosis, Hemosiderin Staining, Mottled, Pallor, Rubor, Erythema. Procedures Wound #7 Pre-procedure diagnosis of Wound #7 is a Lymphedema located on the Right,Lateral Foot . There was a Selective/Open Wound Skin/Dermis Debridement with a total area of 1.5 sq cm performed by Bonnell Public, NP. With the following instrument (s): Curette to remove Non-Viable tissue/material. Material removed includes Skin: Dermis and Fibrin/Exudate and  after achieving pain control using Lidocaine 4% Topical Solution. No specimens were taken. A time out was conducted at 11:07, prior to the start of the procedure. A Minimum amount of bleeding was controlled with Pressure. The procedure was tolerated well with a pain level of 0 throughout and a pain level of 0 following the procedure. Patient s Level of Consciousness post procedure was recorded as Awake and Alert. Post Debridement Measurements: 1cm length x 1.5cm width x 0.2cm depth; 0.236cm^3 volume. Character of Wound/Ulcer Post Debridement is stable. Post procedure Diagnosis Wound #7: Same as Pre-Procedure Plan Wound Cleansing: Wound #6 Right,Posterior Lower Leg: Clean wound with Normal Saline. Cleanse wound with mild soap and water Wound #7 Right,Lateral Foot: Carlos Roach, Carlos Roach ([id number]) Clean wound with Normal Saline. Cleanse wound with mild soap and water Wound #8 Right,Dorsal,Anterior Lower Leg: Clean wound with Normal Saline. Cleanse wound with mild soap and water Anesthetic (add to Medication List): Wound #6 Right,Posterior Lower Leg: Topical Lidocaine 4% cream applied to wound bed prior to debridement (In Clinic Only). Wound #7 Right,Lateral Foot: Topical Lidocaine 4% cream applied to wound bed prior to debridement (In Clinic Only). Wound #8 Right,Dorsal,Anterior Lower Leg: Topical Lidocaine 4% cream applied to wound bed prior to debridement (In Clinic Only). Skin Barriers/Peri-Wound Care: Wound #6 Right,Posterior Lower Leg: Moisturizing  lotion - not on wounds Wound #7 Right,Lateral Foot: Moisturizing lotion - not on wounds Wound #8 Right,Dorsal,Anterior Lower Leg: Moisturizing lotion - not on wounds Primary Wound Dressing: Wound #6 Right,Posterior Lower Leg: Mepitel One Contact layer - Place over blisters (bilateral legs) Hydrafera Blue Ready Transfer Wound #7 Right,Lateral Foot: Mepitel One Contact layer - Place over blisters (bilateral legs) Hydrafera Blue Ready  Transfer Wound #8 Right,Dorsal,Anterior Lower Leg: Mepitel One Contact layer - Place over blisters (bilateral legs) Hydrafera Blue Ready Transfer Secondary Dressing: Wound #6 Right,Posterior Lower Leg: ABD pad Wound #7 Right,Lateral Foot: ABD pad Wound #8 Right,Dorsal,Anterior Lower Leg: ABD pad Dressing Change Frequency: Wound #6 Right,Posterior Lower Leg: Change dressing every week Wound #7 Right,Lateral Foot: Change dressing every week Wound #8 Right,Dorsal,Anterior Lower Leg: Change dressing every week Follow-up Appointments: Wound #6 Right,Posterior Lower Leg: Return Appointment in 2 weeks. - provider appt in 2 weeks Nurse Visit as needed - in one week Wound #7 Right,Lateral Foot: Return Appointment in 2 weeks. - provider appt in 2 weeks Nurse Visit as needed - in one week Wound #8 Right,Dorsal,Anterior Lower Leg: Return Appointment in 2 weeks. - provider appt in 2 weeks Nurse Visit as needed - in one week Edema Control: 4 Layer Compression System - Bilateral - wrap 3 cm from toes to 3 cm from knee with unna to anchor only wrap one layer at a time and do not overlap the same layer Left leg wrapped for lymphedema Additional Orders / Instructions: Wound #6 Right,Posterior Lower Leg: Increase protein intake. Wound #7 Right,Lateral Foot: Carlos Roach, Carlos Roach ([id number]) Increase protein intake. Wound #8 Right,Dorsal,Anterior Lower Leg: Increase protein intake. Home Health: Wound #6 Right,Posterior Lower Leg: D/C Home Health Services Wound #7 Right,Lateral Foot: D/C Home Health Services Wound #8 Right,Dorsal,Anterior Lower Leg: D/C Home Health Services The following medication(s) was prescribed: lidocaine topical 4 % cream 1 1 cream topical was prescribed at facility Electronic Signature(s) Signed: 11/30/2017 11:19:18 AM By: Bonnell Public Entered By: Bonnell Public on 11/30/2017 11:19:18 Carlos Roach  ([id number]) -------------------------------------------------------------------------------- SuperBill Details Patient Name: Carlos Roach Date of Service: 11/30/2017 Medical Record Number: [id number] Patient Account Number: [id number] Date of Birth/Sex: [birth year] (76 y.o. 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 L97.511 Non-pressure chronic ulcer of other part of right foot limited to breakdown of skin R60.9 Edema, unspecified Facility Procedures CPT4 Code Description: [id number] - DEBRIDE WOUND 1ST 20 SQ CM OR < ICD-10 Diagnosis Description L97.511 Non-pressure chronic ulcer of other part of right foot limited t Modifier: o breakdown of Quantity: 1 skin Physician Procedures CPT4 Code Description: [id number] - WC PHYS DEBR WO ANESTH 20 SQ CM ICD-10 Diagnosis Description 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

## 2017-12-14 ENCOUNTER — Encounter: Payer: Medicare Other | Attending: Nurse Practitioner | Admitting: Nurse Practitioner

## 2017-12-14 DIAGNOSIS — L97511 Non-pressure chronic ulcer of other part of right foot limited to breakdown of skin: Secondary | ICD-10-CM | POA: Insufficient documentation

## 2017-12-14 DIAGNOSIS — G4730 Sleep apnea, unspecified: Secondary | ICD-10-CM | POA: Diagnosis not present

## 2017-12-14 DIAGNOSIS — I890 Lymphedema, not elsewhere classified: Secondary | ICD-10-CM | POA: Diagnosis not present

## 2017-12-14 DIAGNOSIS — L97211 Non-pressure chronic ulcer of right calf limited to breakdown of skin: Secondary | ICD-10-CM | POA: Diagnosis not present

## 2017-12-14 DIAGNOSIS — I87333 Chronic venous hypertension (idiopathic) with ulcer and inflammation of bilateral lower extremity: Secondary | ICD-10-CM | POA: Insufficient documentation

## 2017-12-17 NOTE — Progress Notes (Signed)
[place], [place] ([id number]) Visit Report for 12/14/2017 Chief Complaint Document Details Patient Name: Carlos Roach, Carlos Roach Date of Service: 12/14/2017 8:00 AM Medical Record Number: [id number] Patient Account Number: [id number] Date of Birth/Sex: 02/15/1941 (77 y.o. M) Treating RN: Renne Crigler Primary Care Provider: Angus Palms Other Clinician: Referring Provider: Angus Palms Treating Provider/Extender: Kathreen Cosier in Treatment: 7 Information Obtained from: Patient Chief Complaint BLE ulcers Electronic Signature(s) Signed: 12/14/2017 8:57:41 AM By: Bonnell Public Entered By: Bonnell Public on 12/14/2017 08:57:41 Carlos Roach ([id number]) -------------------------------------------------------------------------------- HPI Details Patient Name: Carlos Roach Date of Service: 12/14/2017 8:00 AM Medical Record Number: [id number] Patient Account Number: [id number] Date of Birth/Sex: 1940/04/29 (76 y.o. M) Treating RN: Renne Crigler Primary Care Provider: Angus Palms Other Clinician: Referring Provider: Angus Palms Treating Provider/Extender: Kathreen Cosier in Treatment: 7 History of Present Illness HPI Description: 10/26/17-He presents for initial evaluation of bilateral lower extremity ulcers. He has been wearing an Unna boot compression over the last several months with minimal improvement in ulcerations. He does give a vague and somewhat confusing history regarding his lower extremity ulcers; unable to do identify if these were traumatic in nature, originally blisters, and more exact timeframe. He has not been evaluated by vascular medicine, he has elevated ABIs bilaterally in the office and will be referred to vascular medicine for evaluation. He has palpable dorsalis pedis and posttib bilaterally but we are unable to increase his compression d/t unreliable ABI results. In my opinion he would tolerate 3-4 layer compression and would have more benefit than  the unna boots. We will initiate topical treatment as some of the injuries appear traumatic with removal of dry unna wrap. He will follow up next week. He has been hospitalized with lower extremity cellulitis in the recent and remote past, most recently in March and discharged on Rocephin. He is currently on no antibiotic therapy and presents with no indication of cellulitis 11/02/17-He presents in follow up evaluation for bilateral lower extremity ulcers. He has not had unna boots, or any form of compression since last weeks appointment d/t no supplies by home health. He has arterial studies ordered for 7/29. We will continue with hydrofera blue, ace wrap and unna boots per home health 11/09/17-He presents in follow-up evaluation for bilateral lower extremity ulcers. Arterial studies performed on 7/29 revealed bilateral ABI 1.1-1.12 with bilateral TBI 0.9 and triphasic flow to the bilateral anterior tibial and posterior tibial; venous reflux studies were also performed but results are unavailable. He is improved, we will submit for compression wraps; we will continue with the same treatment plan, increasing compression to 4-layer compression wraps. He will follow up next week 11/16/17-He presents in follow-up evaluation for bilateral lower extremity ulcers. The original ulcerations have healed. There are new blisters to the right pre-tibial and left lateral leg; home health doubled compression layers and the ankle/foot layers separated/rolled. We will place 4 layer compression today, this will maintain for one week. His wife is to apply ace wrap next Thursday and the following Monday as they will be out of town. She has been encouraged to contact us with any concerns/questions/intolerances. The patient will follow up in two weeks Venous studies reviewed revealing no evidence of venous insufficiency bilaterally. 11/30/17- He presents in follow-up evaluation for bilateral lower extremity blisters. The left  lower extremity is healed; the right lower extremity has partial thickness openings to the anterior and posterior aspect with area of dry discoloration to the right lateral foot, easily removed to reveal a partial thickness opening. We  will apply compression therapy today, we will follow up next week for re-wrap and see me in two weeks. 12/14/17-He presents in follow-up evaluation for bilateral lower extremity blisters. The right lower extremity is healed and his compression sock/stocking was applied. The left lower extremity presents with an incomplete reabsorbed blister; we will continue with compression wrap. We will contact [facility] regarding compression wraps as he has not heard anything about juxtalites. Electronic Signature(s) Signed: 12/14/2017 9:00:17 AM By: Bonnell Public Entered By: Bonnell Public on 12/14/2017 09:00:17 Carlos Roach ([id number]) -------------------------------------------------------------------------------- Physician Orders Details Patient Name: Carlos Roach Date of Service: 12/14/2017 8:00 AM Medical Record Number: [id number] Patient Account Number: [id number] Date of Birth/Sex: 05-19-1940 (76 y.o. M) Treating RN: Renne Crigler Primary Care Provider: Angus Palms Other Clinician: Referring Provider: Angus Palms Treating Provider/Extender: Kathreen Cosier in Treatment: 7 Verbal / Phone Orders: No Diagnosis Coding Wound Cleansing o Clean wound with Normal Saline. Primary Wound Dressing o Mepitel One Contact layer - on left lower leg below knee where red area is Dressing Change Frequency o Change dressing every week Edema Control o 4-Layer Compression System - Left Lower Extremity. Home Health o Continue Home Health Visits o Home Health Nurse may visit PRN to address patientos wound care needs. o FACE TO FACE ENCOUNTER: MEDICARE and MEDICAID PATIENTS: I certify that this patient is under my care and that I had a face-to-face  encounter that meets the physician face-to-face encounter requirements with this patient on this date. The encounter with the patient was in whole or in part for the following MEDICAL CONDITION: (primary reason for Home Healthcare) MEDICAL NECESSITY: I certify, that based on my findings, NURSING services are a medically necessary home health service. HOME BOUND STATUS: I certify that my clinical findings support that this patient is homebound (i.e., Due to illness or injury, pt requires aid of supportive devices such as crutches, cane, wheelchairs, walkers, the use of special transportation or the assistance of another person to leave their place of residence. There is a normal inability to leave the home and doing so requires considerable and taxing effort. Other absences are for medical reasons / religious services and are infrequent or of short duration when for other reasons). o If current dressing causes regression in wound condition, may D/C ordered dressing product/s and apply Normal Saline Moist Dressing daily until next Wound Healing Center / Other MD appointment. Notify Wound Healing Center of regression in wound condition at [phone]. o Please direct any NON-WOUND related issues/requests for orders to patient's Primary Care Physician Electronic Signature(s) Signed: 12/14/2017 2:41:32 PM By: Renne Crigler Signed: 12/14/2017 6:16:13 PM By: Bonnell Public Entered By: Renne Crigler on 12/14/2017 08:41:21 Carlos Roach, Carlos Roach ([id number]) -------------------------------------------------------------------------------- Problem List Details Patient Name: Carlos Roach Date of Service: 12/14/2017 8:00 AM Medical Record Number: [id number] Patient Account Number: [id number] Date of Birth/Sex: [birth year] (76 y.o. 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 10/26/2017 No Yes L97.511 Non-pressure chronic ulcer of other part of right foot limited to 11/30/2017 No Yes breakdown of skin R60.9 Edema, unspecified 10/26/2017 No Yes I87.333 Chronic venous hypertension (idiopathic) with ulcer and 10/26/2017 No Yes inflammation of bilateral lower extremity Inactive Problems Resolved Problems ICD-10 Code  Description Active Date Resolved Date L97.211 Non-pressure chronic ulcer of right calf limited to breakdown of skin 10/26/2017 10/26/2017 L97.221 Non-pressure chronic ulcer of left calf limited to breakdown of skin 10/26/2017 10/26/2017 S80.821A Blister (nonthermal), right lower leg, initial encounter 11/16/2017 11/16/2017 S80.822A Blister (nonthermal), left lower leg, initial encounter 11/16/2017 11/16/2017 Carlos Roach ([id number]) Electronic Signature(s) Signed: 12/14/2017 10:25:51 AM By: Bonnell Public Previous Signature: 12/14/2017 8:57:09 AM Version By: Bonnell Public Entered By: Bonnell Public on 12/14/2017 10:25:51 Carlos Roach ([id number]) -------------------------------------------------------------------------------- Progress Note Details Patient Name: Carlos Roach Date of Service: 12/14/2017 8:00 AM Medical Record Number: [id number] Patient Account Number: [id number] Date of Birth/Sex: 1940/08/21 (76 y.o. M) Treating RN: Renne Crigler Primary Care Provider: Angus Palms Other Clinician: Referring Provider: Angus Palms Treating Provider/Extender: Kathreen Cosier in Treatment: 7 Subjective Chief Complaint Information obtained from Patient BLE ulcers History of Present Illness (HPI) 10/26/17-He presents for initial evaluation of bilateral lower extremity ulcers. He has been  wearing an Unna boot compression over the last several months with minimal improvement in ulcerations. He does give a vague and somewhat confusing history regarding his lower extremity ulcers; unable to do identify if these were traumatic in nature, originally blisters, and more exact timeframe. He has not been evaluated by vascular medicine, he has elevated ABIs bilaterally in the office and will be referred to vascular medicine for evaluation. He has palpable dorsalis pedis and posttib bilaterally but we are unable to increase his compression d/t unreliable ABI results. In my opinion he would tolerate 3-4 layer compression and would have more benefit than the unna boots. We will initiate topical treatment as some of the injuries appear traumatic with removal of dry unna wrap. He will follow up next week. He has been hospitalized with lower extremity cellulitis in the recent and remote past, most recently in March and discharged on Rocephin. He is currently on no antibiotic therapy and presents with no indication of cellulitis 11/02/17-He presents in follow up evaluation for bilateral lower extremity ulcers. He has not had unna boots, or any form of compression since last weeks appointment d/t no supplies by home health. He has arterial studies ordered for 7/29. We will continue with hydrofera blue, ace wrap and unna boots per home health 11/09/17-He presents in follow-up evaluation for bilateral lower extremity ulcers. Arterial studies performed on 7/29 revealed bilateral ABI 1.1-1.12 with bilateral TBI 0.9 and triphasic flow to the bilateral anterior tibial and posterior tibial; venous reflux studies were also performed but results are unavailable. He is improved, we will submit for compression wraps; we will continue with the same treatment plan, increasing compression to 4-layer compression wraps. He will follow up next week 11/16/17-He presents in follow-up evaluation for bilateral lower extremity  ulcers. The original ulcerations have healed. There are new blisters to the right pre-tibial and left lateral leg; home health doubled compression layers and the ankle/foot layers separated/rolled. We will place 4 layer compression today, this will maintain for one week. His wife is to apply ace wrap next Thursday and the following Monday as they will be out of town. She has been encouraged to contact us with any concerns/questions/intolerances. The patient will follow up in two weeks Venous studies reviewed revealing no evidence of venous insufficiency bilaterally. 11/30/17- He presents in follow-up evaluation for bilateral lower extremity blisters. The left lower extremity is healed; the right lower extremity has partial thickness openings to the anterior and posterior aspect with area of dry discoloration to the right lateral foot, easily removed  to reveal a partial thickness opening. We will apply compression therapy today, we will follow up next week for re-wrap and see me in two weeks. 12/14/17-He presents in follow-up evaluation for bilateral lower extremity blisters. The right lower extremity is healed and his compression sock/stocking was applied. The left lower extremity presents with an incomplete reabsorbed blister; we will continue with compression wrap. We will contact [facility] regarding compression wraps as he has not heard anything about juxtalites. Objective Carlos Roach, Carlos Roach ([id number]) Constitutional Vitals Time Taken: 8:06 AM, Height: 72 in, Weight: 252 lbs, BMI: 34.2, Temperature: 98.1 F, Pulse: 73 bpm, Respiratory Rate: 18 breaths/min, Blood Pressure: 118/67 mmHg. Integumentary (Hair, Skin) Wound #6 status is Healed - Epithelialized. Original cause of wound was Gradually Appeared. The wound is located on the Right,Posterior Lower Leg. The wound measures 0cm length x 0cm width x 0cm depth; 0cm^2 area and 0cm^3 volume. There is no tunneling or undermining noted. There is a large  amount of sanguinous drainage noted. The wound margin is flat and intact. There is no granulation within the wound bed. There is no necrotic tissue within the wound bed. The periwound skin appearance did not exhibit: Callus, Crepitus, Excoriation, Induration, Rash, Scarring, Dry/Scaly, Maceration, Atrophie Blanche, Cyanosis, Ecchymosis, Hemosiderin Staining, Mottled, Pallor, Rubor, Erythema. Periwound temperature was noted as No Abnormality. The periwound has tenderness on palpation. Wound #7 status is Healed - Epithelialized. Original cause of wound was Gradually Appeared. The wound is located on the Right,Lateral Foot. The wound measures 0cm length x 0cm width x 0cm depth; 0cm^2 area and 0cm^3 volume. There is no tunneling or undermining noted. There is a none present amount of drainage noted. The wound margin is distinct with the outline attached to the wound base. There is no granulation within the wound bed. There is no necrotic tissue within the wound bed. The periwound skin appearance did not exhibit: Callus, Crepitus, Excoriation, Induration, Rash, Scarring, Dry/Scaly, Maceration, Atrophie Blanche, Cyanosis, Ecchymosis, Hemosiderin Staining, Mottled, Pallor, Rubor, Erythema. Periwound temperature was noted as No Abnormality. The periwound has tenderness on palpation. Wound #8 status is Healed - Epithelialized. Original cause of wound was Gradually Appeared. The wound is located on the Right,Dorsal,Anterior Lower Leg. The wound measures 0cm length x 0cm width x 0cm depth; 0cm^2 area and 0cm^3 volume. There is no tunneling or undermining noted. There is a none present amount of drainage noted. The wound margin is flat and intact. There is no granulation within the wound bed. There is no necrotic tissue within the wound bed. The periwound skin appearance did not exhibit: Callus, Crepitus, Excoriation, Induration, Rash, Scarring, Dry/Scaly, Maceration, Atrophie Blanche, Cyanosis, Ecchymosis,  Hemosiderin Staining, Mottled, Pallor, Rubor, Erythema. 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 Primary Wound Dressing: Mepitel One Contact layer - on left lower leg below knee where red area is Dressing Change Frequency: Change dressing every week Edema Control: SHILOH, SWOPES ([id number]) 4-Layer Compression System - Left Lower Extremity. Home Health: Continue Home Health Visits Home Health Nurse may visit PRN to address patient s wound care needs. FACE TO FACE ENCOUNTER: MEDICARE and MEDICAID PATIENTS: I certify that this patient is under my care and that I had a face-to-face encounter  that meets the physician face-to-face encounter requirements with this patient on this date. The encounter with the patient was in whole or in part for the following MEDICAL CONDITION: (primary reason for Home Healthcare) MEDICAL NECESSITY: I certify, that based on my findings, NURSING services are a medically necessary home health service. HOME BOUND STATUS: I certify that my clinical findings support that this patient is homebound (i.e., Due to illness or injury, pt requires aid of supportive devices such as crutches, cane, wheelchairs, walkers, the use of special transportation or the assistance of another person to leave their place of residence. There is a normal inability to leave the home and doing so requires considerable and taxing effort. Other absences are for medical reasons / religious services and are infrequent or of short duration when for other reasons). If current dressing causes regression in wound condition,  may D/C ordered dressing product/s and apply Normal Saline Moist Dressing daily until next Wound Healing Center / Other MD appointment. Notify Wound Healing Center of regression in wound condition at [phone]. Please direct any NON-WOUND related issues/requests for orders to patient's Primary Care Physician Electronic Signature(s) Signed: 12/14/2017 10:26:10 AM By: Bonnell Public Previous Signature: 12/14/2017 9:03:21 AM Version By: Bonnell Public Entered By: Bonnell Public on 12/14/2017 10:26:09 Carlos Roach ([id number]) -------------------------------------------------------------------------------- SuperBill Details Patient Name: Carlos Roach Date of Service: 12/14/2017 Medical Record Number: [id number] Patient Account Number: [id number] Date of Birth/Sex: January 29, 1941 (77 y.o. 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 L97.511 Non-pressure chronic ulcer of other part of right foot limited to breakdown of skin R60.9 Edema, unspecified Facility Procedures CPT4 Code: [id number] Description: [id number] - WOUND CARE VISIT-LEV 2 EST PT Modifier: Quantity: 1 Physician Procedures CPT4 Code Description: [id number] - WC PHYS LEVEL 2 - EST PT ICD-10 Diagnosis Description 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

## 2017-12-17 NOTE — Progress Notes (Signed)
DOCTOR, SHEAHAN ([id number]) Visit Report for 12/14/2017 Arrival Information Details Patient Name: Carlos Roach Date of Service: 12/14/2017 8:00 AM Medical Record Number: [id number] Patient Account Number: [id number] Date of Birth/Sex: 12-05-1940 (77 y.o. M) Treating RN: Renne Crigler Primary Care Carlos Roach: Carlos Roach Other Clinician: Referring [facility] Cortez: Carlos Roach Treating Miu Chiong/Extender: Kathreen Cosier in Treatment: 7 Visit Information History Since Last Visit Added or deleted any medications: No Patient Arrived: Ambulatory Any new allergies or adverse reactions: No Arrival Time: 08:04 Had a fall or experienced change in No Accompanied By: wife activities of daily living that may affect Transfer Assistance: None risk of falls: Patient Identification Verified: Yes Signs or symptoms of abuse/neglect since last visito No Secondary Verification Process Yes Hospitalized since last visit: No Completed: Implantable device outside of the clinic excluding No Patient Has Alerts: Yes cellular tissue based products placed in the center Patient Alerts: Patient on Blood since last visit: Thinner Has Dressing in Place as Prescribed: Yes Eliquis Pain Present Now: No Bilateral ABI 1.2 Bilateral TBI 0.9 Electronic Signature(s) Signed: 12/14/2017 4:37:30 PM By: Dayton Martes RCP, RRT, CHT Entered By: Dayton Martes on 12/14/2017 08:05:05 Carlos Roach ([id number]) -------------------------------------------------------------------------------- Clinic Level of Care Assessment Details Patient Name: Carlos Roach Date of Service: 12/14/2017 8:00 AM Medical Record Number: [id number] Patient Account Number: [id number] Date of Birth/Sex: 08-29-1940 (76 y.o. M) Treating RN: Renne Crigler Primary Care Chrysta Fulcher: Carlos Roach Other Clinician: Referring Carlos Roach: Carlos Roach Treating Cammy Sanjurjo/Extender: Kathreen Cosier in Treatment:  7 Clinic Level of Care Assessment Items TOOL 4 Quantity Score X - Use when only an EandM is performed on FOLLOW-UP visit 1 0 ASSESSMENTS - Nursing Assessment / Reassessment X - Reassessment of Co-morbidities (includes updates in patient status) 1 10 X- 1 5 Reassessment of Adherence to Treatment Plan ASSESSMENTS - Wound and Skin Assessment / Reassessment X - Simple Wound Assessment / Reassessment - one wound 1 5 []  - 0 Complex Wound Assessment / Reassessment - multiple wounds []  - 0 Dermatologic / Skin Assessment (not related to wound area) ASSESSMENTS - Focused Assessment []  - Circumferential Edema Measurements - multi extremities 0 []  - 0 Nutritional Assessment / Counseling / Intervention []  - 0 Lower Extremity Assessment (monofilament, tuning fork, pulses) []  - 0 Peripheral Arterial Disease Assessment (using hand held doppler) ASSESSMENTS - Ostomy and/or Continence Assessment and Care []  - Incontinence Assessment and Management 0 []  - 0 Ostomy Care Assessment and Management (repouching, etc.) PROCESS - Coordination of Care X - Simple Patient / Family Education for ongoing care 1 15 []  - 0 Complex (extensive) Patient / Family Education for ongoing care []  - 0 Staff obtains Chiropractor, Records, Test Results / Process Orders []  - 0 Staff telephones HHA, Nursing Homes / Clarify orders / etc []  - 0 Routine Transfer to another Facility (non-emergent condition) []  - 0 Routine Hospital Admission (non-emergent condition) []  - 0 New Admissions / Manufacturing engineer / Ordering NPWT, Apligraf, etc. []  - 0 Emergency Hospital Admission (emergent condition) X- 1 10 Simple Discharge Coordination Carlos Roach ([id number]) []  - 0 Complex (extensive) Discharge Coordination PROCESS - Special Needs []  - Pediatric / Minor Patient Management 0 []  - 0 Isolation Patient Management []  - 0 Hearing / Language / Visual special needs []  - 0 Assessment of Community assistance  (transportation, D/C planning, etc.) []  - 0 Additional assistance / Altered mentation []  - 0 Support Surface(s) Assessment (bed, cushion, seat, etc.) INTERVENTIONS - Wound Cleansing / Measurement X - Simple Wound Cleansing - one wound  1 5 []  - 0 Complex Wound Cleansing - multiple wounds X- 1 5 Wound Imaging (photographs - any number of wounds) []  - 0 Wound Tracing (instead of photographs) X- 1 5 Simple Wound Measurement - one wound []  - 0 Complex Wound Measurement - multiple wounds INTERVENTIONS - Wound Dressings X - Small Wound Dressing one or multiple wounds 1 10 []  - 0 Medium Wound Dressing one or multiple wounds []  - 0 Large Wound Dressing one or multiple wounds []  - 0 Application of Medications - topical []  - 0 Application of Medications - injection INTERVENTIONS - Miscellaneous []  - External ear exam 0 []  - 0 Specimen Collection (cultures, biopsies, blood, body fluids, etc.) []  - 0 Specimen(s) / Culture(s) sent or taken to Lab for analysis []  - 0 Patient Transfer (multiple staff / Nurse, adult / Similar devices) []  - 0 Simple Staple / Suture removal (25 or less) []  - 0 Complex Staple / Suture removal (26 or more) []  - 0 Hypo / Hyperglycemic Management (close monitor of Blood Glucose) []  - 0 Ankle / Brachial Index (ABI) - do not check if billed separately X- 1 5 Vital Signs Carlos Roach ([id number]) Has the patient been seen at the hospital within the last three years: Yes Total Score: 75 Level Of Care: New/Established - Level 2 Electronic Signature(s) Signed: 12/14/2017 2:41:32 PM By: Renne Crigler Entered By: Renne Crigler on 12/14/2017 08:41:58 Carlos Roach ([id number]) -------------------------------------------------------------------------------- Encounter Discharge Information Details Patient Name: Carlos Roach Date of Service: 12/14/2017 8:00 AM Medical Record Number: [id number] Patient Account Number: [id number] Date of Birth/Sex:  16-Jan-1941 (76 y.o. M) Treating RN: Huel Coventry Primary Care Ciaira Natividad: Carlos Roach Other Clinician: Referring Sterling Mondo: Carlos Roach Treating Hiran Leard/Extender: Kathreen Cosier in Treatment: 7 Encounter Discharge Information Items Discharge Condition: Stable Ambulatory Status: Walker Discharge Destination: Home Transportation: Private Auto Accompanied By: self Schedule Follow-up Appointment: Yes Clinical Summary of Care: Electronic Signature(s) Signed: 12/14/2017 5:22:10 PM By: Elliot Gurney, BSN, RN, CWS, Kim RN, BSN Entered By: Elliot Gurney, BSN, RN, CWS, Kim on 12/14/2017 08:57:00 Carlos Roach ([id number]) -------------------------------------------------------------------------------- Lower Extremity Assessment Details Patient Name: Carlos Roach Date of Service: 12/14/2017 8:00 AM Medical Record Number: [id number] Patient Account Number: [id number] Date of Birth/Sex: [birth year] (76 y.o. M) Treating RN: Curtis Sites Primary Care Roe Koffman: Carlos Roach Other Clinician: Referring Penina Reisner: Carlos Roach Treating Johnnay Pleitez/Extender: Kathreen Cosier in Treatment: 7 Edema Assessment Assessed: [Left: No] [Right: No] [Left: Edema] [Right: :] Calf Left: Right: Point of Measurement: 36 cm From Medial Instep 35.8 cm 36.5 cm Ankle Left: Right: Point of Measurement: 14 cm From Medial Instep 23.2 cm 23.4 cm Vascular Assessment Pulses: Dorsalis Pedis Palpable: [Left:Yes] [Right:Yes] Posterior Tibial Extremity colors, hair growth, and conditions: Extremity Color: [Left:Hyperpigmented] [Right:Hyperpigmented] Hair Growth on Extremity: [Left:No] [Right:No] Temperature of Extremity: [Left:Warm] [Right:Warm] Capillary Refill: [Left:< 3 seconds] [Right:< 3 seconds] Toe Nail Assessment Left: Right: Thick: Yes Yes Discolored: Yes Yes Deformed: Yes Yes Improper Length and Hygiene: No No Electronic Signature(s) Signed: 12/14/2017 5:08:53 PM By: Curtis Sites Entered By: Curtis Sites on 12/14/2017 08:19:14 Carlos Roach ([id number]) -------------------------------------------------------------------------------- Multi Wound Chart Details Patient Name: Carlos Roach Date of Service: 12/14/2017 8:00 AM Medical Record Number: [id number] Patient Account Number: [id number] Date of Birth/Sex: 03/10/1941 (76 y.o. M) Treating RN: Renne Crigler Primary Care Joniqua Sidle: Carlos Roach Other Clinician: Referring Debbora Ang: Carlos Roach Treating Tyger Wichman/Extender: Kathreen Cosier in Treatment: 7 Vital Signs Height(in): 72 Pulse(bpm): 73 Weight(lbs): 252 Blood Pressure(mmHg): 118/67 Body Mass Index(BMI): 34 Temperature(F): 98.1 Respiratory Rate 18 (breaths/min):  Photos: Wound Location: Right, Posterior Lower Leg Right, Lateral Foot Right, Dorsal, Anterior Lower Leg Wounding Event: Gradually Appeared Gradually Appeared Gradually Appeared Primary Etiology: Lymphedema Lymphedema Lymphedema Comorbid History: Sleep Apnea, Arrhythmia, Sleep Apnea, Arrhythmia, Sleep Apnea, Arrhythmia, Congestive Heart Failure, Congestive Heart Failure, Congestive Heart Failure, Hypertension, Peripheral Hypertension, Peripheral Hypertension, Peripheral Venous Disease Venous Disease Venous Disease Date Acquired: 11/30/2017 11/30/2017 11/30/2017 Weeks of Treatment: 2 2 2  Wound Status: Healed - Epithelialized Healed - Epithelialized Healed - Epithelialized Measurements L x W x D 0x0x0 0x0x0 0x0x0 (cm) Area (cm) : 0 0 0 Volume (cm) : 0 0 0 % Reduction in Area: 100.00% 100.00% 100.00% % Reduction in Volume: 100.00% 100.00% 100.00% Classification: Full Thickness Without Partial Thickness Partial Thickness Exposed Support Structures Exudate Amount: Large None Present None Present Exudate Type: Sanguinous N/A N/A Exudate Color: red N/A N/A Wound Margin: Flat and Intact Distinct, outline attached Flat and Intact Granulation Amount: None Present (0%) None Present (0%) None  Present (0%) Necrotic Amount: None Present (0%) None Present (0%) None Present (0%) Exposed Structures: Fascia: No Fascia: No Fascia: No Fat Layer (Subcutaneous Fat Layer (Subcutaneous Fat Layer (Subcutaneous Tissue) Exposed: No Tissue) Exposed: No Tissue) Exposed: No Tendon: No Tendon: No Tendon: No REGINAL, WOJCICKI ([id number]) Muscle: No Muscle: No Muscle: No Joint: No Joint: No Joint: No Bone: No Bone: No Bone: No Epithelialization: None None None Periwound Skin Texture: Excoriation: No Excoriation: No Excoriation: No Induration: No Induration: No Induration: No Callus: No Callus: No Callus: No Crepitus: No Crepitus: No Crepitus: No Rash: No Rash: No Rash: No Scarring: No Scarring: No Scarring: No Periwound Skin Moisture: Maceration: No Maceration: No Maceration: No Dry/Scaly: No Dry/Scaly: No Dry/Scaly: No Periwound Skin Color: Atrophie Blanche: No Atrophie Blanche: No Atrophie Blanche: No Cyanosis: No Cyanosis: No Cyanosis: No Ecchymosis: No Ecchymosis: No Ecchymosis: No Erythema: No Erythema: No Erythema: No Hemosiderin Staining: No Hemosiderin Staining: No Hemosiderin Staining: No Mottled: No Mottled: No Mottled: No Pallor: No Pallor: No Pallor: No Rubor: No Rubor: No Rubor: No Temperature: No Abnormality No Abnormality N/A Tenderness on Palpation: Yes Yes No Wound Preparation: Ulcer Cleansing: Ulcer Cleansing: Ulcer Cleansing: Other: soap Rinsed/Irrigated with Saline, Rinsed/Irrigated with Saline, and water Other: soap and water Other: soap and water Topical Anesthetic Applied: Topical Anesthetic Applied: Topical Anesthetic Applied: Other: lidocaine 4% Other: lidocaine 4% Other: lidocaine 4% Treatment Notes Electronic Signature(s) Signed: 12/14/2017 8:57:34 AM By: Bonnell Public Entered By: Bonnell Public on 12/14/2017 08:57:33 Carlos Roach  ([id number]) -------------------------------------------------------------------------------- Multi-Disciplinary Care Plan Details Patient Name: Carlos Roach Date of Service: 12/14/2017 8:00 AM Medical Record Number: [id number] Patient Account Number: [id number] Date of Birth/Sex: Feb 19, 1941 (76 y.o. M) Treating RN: Renne Crigler Primary Care Yaretzy Olazabal: Carlos Roach Other Clinician: Referring Jarryd Gratz: Carlos Roach Treating Lavone Weisel/Extender: Kathreen Cosier in Treatment: 7 Active Inactive ` Abuse / Safety / Falls / Self Care Management Nursing Diagnoses: Potential for falls Goals: Patient will not experience any injury related to falls Date Initiated: 10/26/2017 Target Resolution Date: 02/17/2018 Goal Status: Active Interventions: Assess Activities of Daily Living upon admission and as needed Assess fall risk on admission and as needed Assess: immobility, friction, shearing, incontinence upon admission and as needed Assess impairment of mobility on admission and as needed per policy Notes: ` Nutrition Nursing Diagnoses: Imbalanced nutrition Potential for alteratiion in Nutrition/Potential for imbalanced nutrition Goals: Patient/caregiver agrees to and verbalizes understanding of need to use nutritional supplements and/or vitamins as prescribed Date Initiated: 10/26/2017 Target Resolution Date: 02/17/2018 Goal Status: Active Interventions: Assess  patient nutrition upon admission and as needed per policy Notes: ` Orientation to the Wound Care Program Nursing Diagnoses: Knowledge deficit related to the wound healing center program Goals: Patient/caregiver will verbalize understanding of the Wound Healing Center Program ARTEMIO, DOBIE ([id number]) Date Initiated: 10/26/2017 Target Resolution Date: 11/18/2017 Goal Status: Active Interventions: Provide education on orientation to the wound center Notes: ` Wound/Skin Impairment Nursing Diagnoses: Impaired  tissue integrity Knowledge deficit related to ulceration/compromised skin integrity Goals: Ulcer/skin breakdown will have a volume reduction of 80% by week 12 Date Initiated: 10/26/2017 Target Resolution Date: 02/17/2018 Goal Status: Active Interventions: Assess patient/caregiver ability to perform ulcer/skin care regimen upon admission and as needed Assess ulceration(s) every visit Notes: Electronic Signature(s) Signed: 12/14/2017 2:41:32 PM By: Renne Crigler Entered By: Renne Crigler on 12/14/2017 08:35:51 Carlos Roach ([id number]) -------------------------------------------------------------------------------- Pain Assessment Details Patient Name: Carlos Roach Date of Service: 12/14/2017 8:00 AM Medical Record Number: [id number] Patient Account Number: [id number] Date of Birth/Sex: 05-07-1940 (76 y.o. M) Treating RN: Renne Crigler Primary Care Yarden Hillis: Carlos Roach Other Clinician: Referring Sahan Pen: Carlos Roach Treating Feleshia Zundel/Extender: Kathreen Cosier in Treatment: 7 Active Problems Location of Pain Severity and Description of Pain Patient Has Paino No Site Locations Pain Management and Medication Current Pain Management: Electronic Signature(s) Signed: 12/14/2017 2:41:32 PM By: Renne Crigler Signed: 12/14/2017 4:37:30 PM By: Dayton Martes RCP, RRT, CHT Entered By: Dayton Martes on 12/14/2017 08:05:15 Carlos Roach ([id number]) -------------------------------------------------------------------------------- Patient/Caregiver Education Details Patient Name: Carlos Roach Date of Service: 12/14/2017 8:00 AM Medical Record Number: [id number] Patient Account Number: [id number] Date of Birth/Gender: 1940/10/30 (76 y.o. M) Treating RN: Huel Coventry Primary Care Physician: Carlos Roach Other Clinician: Referring Physician: Angus Roach Treating Physician/Extender: Kathreen Cosier in Treatment: 7 Education  Assessment Education Provided To: Patient Education Topics Provided Venous: Handouts: Controlling Swelling with Multilayered Compression Wraps Methods: Demonstration, Explain/Verbal Responses: State content correctly Electronic Signature(s) Signed: 12/14/2017 5:22:10 PM By: Elliot Gurney, BSN, RN, CWS, Kim RN, BSN Entered By: Elliot Gurney, BSN, RN, CWS, Kim on 12/14/2017 08:57:12 Carlos Roach ([id number]) -------------------------------------------------------------------------------- Wound Assessment Details Patient Name: Carlos Roach Date of Service: 12/14/2017 8:00 AM Medical Record Number: [id number] Patient Account Number: [id number] Date of Birth/Sex: 02/28/1941 (76 y.o. M) Treating RN: Renne Crigler Primary Care Robin Petrakis: Carlos Roach Other Clinician: Referring Amonte Brookover: Carlos Roach Treating Magnum Lunde/Extender: Kathreen Cosier in Treatment: 7 Wound Status Wound Number: 6 Primary Lymphedema Etiology: Wound Location: Right, Posterior Lower Leg Wound Healed - Epithelialized Wounding Event: Gradually Appeared Status: Date Acquired: 11/30/2017 Comorbid Sleep Apnea, Arrhythmia, Congestive Heart Weeks Of Treatment: 2 History: Failure, Hypertension, Peripheral Venous Clustered Wound: No Disease Photos Photo Uploaded By: Curtis Sites on 12/14/2017 08:47:32 Wound Measurements Length: (cm) 0 % Redu Width: (cm) 0 % Redu Depth: (cm) 0 Epithe Area: (cm) 0 Tunne Volume: (cm) 0 Under ction in Area: 100% ction in Volume: 100% lialization: None ling: No mining: No Wound Description Full Thickness Without Exposed Support Classification: Structures Wound Margin: Flat and Intact Exudate Large Amount: Exudate Type: Sanguinous Exudate Color: red Foul Odor After Cleansing: No Slough/Fibrino No Wound Bed Granulation Amount: None Present (0%) Exposed Structure Necrotic Amount: None Present (0%) Fascia Exposed: No Fat Layer (Subcutaneous Tissue) Exposed: No Tendon  Exposed: No Muscle Exposed: No Joint Exposed: No Bone Exposed: No Kauffmann, Keionte ([id number]) Periwound Skin Texture Texture Color No Abnormalities Noted: No No Abnormalities Noted: No Callus: No Atrophie Blanche: No Crepitus: No Cyanosis: No Excoriation: No Ecchymosis: No Induration: No Erythema: No Rash: No Hemosiderin Staining: No Scarring: No Mottled:  No Pallor: No Moisture Rubor: No No Abnormalities Noted: No Dry / Scaly: No Temperature / Pain Maceration: No Temperature: No Abnormality Tenderness on Palpation: Yes Wound Preparation Ulcer Cleansing: Rinsed/Irrigated with Saline, Other: soap and water, Topical Anesthetic Applied: Other: lidocaine 4%, Electronic Signature(s) Signed: 12/14/2017 2:41:32 PM By: Renne Crigler Entered By: Renne Crigler on 12/14/2017 08:37:25 GIROLAMO, LORTIE ([id number]) -------------------------------------------------------------------------------- Wound Assessment Details Patient Name: Carlos Roach Date of Service: 12/14/2017 8:00 AM Medical Record Number: [id number] Patient Account Number: [id number] Date of Birth/Sex: 07/17/1940 (76 y.o. M) Treating RN: Renne Crigler Primary Care Courtez Twaddle: Carlos Roach Other Clinician: Referring Allizon Woznick: Carlos Roach Treating Junie Avilla/Extender: Kathreen Cosier in Treatment: 7 Wound Status Wound Number: 7 Primary Lymphedema Etiology: Wound Location: Right, Lateral Foot Wound Healed - Epithelialized Wounding Event: Gradually Appeared Status: Date Acquired: 11/30/2017 Comorbid Sleep Apnea, Arrhythmia, Congestive Heart Weeks Of Treatment: 2 History: Failure, Hypertension, Peripheral Venous Clustered Wound: No Disease Photos Photo Uploaded By: Curtis Sites on 12/14/2017 08:47:33 Wound Measurements Length: (cm) 0 % Re Width: (cm) 0 % Re Depth: (cm) 0 Epit Area: (cm) 0 Tun Volume: (cm) 0 Und duction in Area: 100% duction in Volume: 100% helialization:  None neling: No ermining: No Wound Description Classification: Partial Thickness Wound Margin: Distinct, outline attached Exudate Amount: None Present Foul Odor After Cleansing: No Slough/Fibrino Yes Wound Bed Granulation Amount: None Present (0%) Exposed Structure Necrotic Amount: None Present (0%) Fascia Exposed: No Fat Layer (Subcutaneous Tissue) Exposed: No Tendon Exposed: No Muscle Exposed: No Joint Exposed: No Bone Exposed: No Periwound Skin Texture Texture Color No Abnormalities Noted: No No Abnormalities Noted: No CRUZITO, STANDRE ([id number]) Callus: No Atrophie Blanche: No Crepitus: No Cyanosis: No Excoriation: No Ecchymosis: No Induration: No Erythema: No Rash: No Hemosiderin Staining: No Scarring: No Mottled: No Pallor: No Moisture Rubor: No No Abnormalities Noted: No Dry / Scaly: No Temperature / Pain Maceration: No Temperature: No Abnormality Tenderness on Palpation: Yes Wound Preparation Ulcer Cleansing: Rinsed/Irrigated with Saline, Other: soap and water, Topical Anesthetic Applied: Other: lidocaine 4%, Electronic Signature(s) Signed: 12/14/2017 2:41:32 PM By: Renne Crigler Entered By: Renne Crigler on 12/14/2017 08:37:25 MAKELL, CYR ([id number]) -------------------------------------------------------------------------------- Wound Assessment Details Patient Name: Carlos Roach Date of Service: 12/14/2017 8:00 AM Medical Record Number: [id number] Patient Account Number: [id number] Date of Birth/Sex: [birth year] (76 y.o. M) Treating RN: Renne Crigler Primary Care Lincoln Ginley: Carlos Roach Other Clinician: Referring Sabrin Dunlevy: Carlos Roach Treating Druscilla Petsch/Extender: Kathreen Cosier in Treatment: 7 Wound Status Wound Number: 8 Primary Lymphedema Etiology: Wound Location: Right, Dorsal, Anterior Lower Leg Wound Healed - Epithelialized Wounding Event: Gradually Appeared Status: Date Acquired: 11/30/2017 Comorbid Sleep  Apnea, Arrhythmia, Congestive Heart Weeks Of Treatment: 2 History: Failure, Hypertension, Peripheral Venous Clustered Wound: No Disease Photos Photo Uploaded By: Curtis Sites on 12/14/2017 08:47:40 Wound Measurements Length: (cm) 0 % R Width: (cm) 0 % R Depth: (cm) 0 Epi Area: (cm) 0 Tu Volume: (cm) 0 Un eduction in Area: 100% eduction in Volume: 100% thelialization: None nneling: No dermining: No Wound Description Classification: Partial Thickness Wound Margin: Flat and Intact Exudate Amount: None Present Foul Odor After Cleansing: No Slough/Fibrino No Wound Bed Granulation Amount: None Present (0%) Exposed Structure Necrotic Amount: None Present (0%) Fascia Exposed: No Fat Layer (Subcutaneous Tissue) Exposed: No Tendon Exposed: No Muscle Exposed: No Joint Exposed: No Bone Exposed: No Periwound Skin Texture Texture Color No Abnormalities Noted: No No Abnormalities Noted: No YEAGLEY, Kamali ([id number]) Callus: No Atrophie Blanche: No Crepitus: No Cyanosis: No Excoriation: No Ecchymosis: No Induration: No Erythema: No  Rash: No Hemosiderin Staining: No Scarring: No Mottled: No Pallor: No Moisture Rubor: No No Abnormalities Noted: No Dry / Scaly: No Maceration: No Wound Preparation Ulcer Cleansing: Other: soap and water, Topical Anesthetic Applied: Other: lidocaine 4%, Electronic Signature(s) Signed: 12/14/2017 2:41:32 PM By: Renne Crigler Entered By: Renne Crigler on 12/14/2017 08:37:25 GIONNI, VACA ([id number]) -------------------------------------------------------------------------------- Vitals Details Patient Name: Carlos Roach Date of Service: 12/14/2017 8:00 AM Medical Record Number: [id number] Patient Account Number: [id number] Date of Birth/Sex: Aug 05, 1940 (76 y.o. M) Treating RN: Renne Crigler Primary Care Livvy Spilman: Carlos Roach Other Clinician: Referring Chritopher Coster: Carlos Roach Treating Jaeveon Ashland/Extender: Kathreen Cosier in Treatment: 7 Vital Signs Time Taken: 08:06 Temperature (F): 98.1 Height (in): 72 Pulse (bpm): 73 Weight (lbs): 252 Respiratory Rate (breaths/min): 18 Body Mass Index (BMI): 34.2 Blood Pressure (mmHg): 118/67 Reference Range: 80 - 120 mg / dl Electronic Signature(s) Signed: 12/14/2017 4:37:30 PM By: Dayton Martes RCP, RRT, CHT Entered By: Weyman Rodney, Lucio Edward on 12/14/2017 08:08:32

## 2017-12-21 ENCOUNTER — Encounter: Payer: Medicare Other | Admitting: Physician Assistant

## 2017-12-26 NOTE — Progress Notes (Signed)
Carlos Roach, Carlos Roach ([id number]) Visit Report for 12/21/2017 Arrival Information Details Patient Name: Carlos Roach, Carlos Roach Date of Service: 12/21/2017 2:00 PM Medical Record Number: [id number] Patient Account Number: [id number] Date of Birth/Sex: 11-15-1940 (77 y.o. M) Treating RN: Renne CriglerFlinchum, Cheryl Primary Care Brodric Schauer: Angus PalmsGEORGE, SIONNE Other Clinician: Referring Athaliah Baumbach: Angus PalmsGEORGE, SIONNE Treating Veronnica Hennings/Extender: Linwood DibblesSTONE III, HOYT Weeks in Treatment: 8 Visit Information History Since Last Visit Added or deleted any medications: No Patient Arrived: Walker Any new allergies or adverse reactions: No Arrival Time: 14:04 Had a fall or experienced change in No Accompanied By: self activities of daily living that may affect Transfer Assistance: None risk of falls: Patient Identification Verified: Yes Signs or symptoms of abuse/neglect since last visito No Secondary Verification Process Yes Hospitalized since last visit: No Completed: Implantable device outside of the clinic excluding No Patient Has Alerts: Yes cellular tissue based products placed in the center Patient Alerts: Patient on Blood since last visit: Thinner Has Dressing in Place as Prescribed: Yes Eliquis Pain Present Now: No Bilateral ABI 1.2 Bilateral TBI 0.9 Electronic Signature(s) Signed: 12/21/2017 4:20:46 PM By: Dayton MartesWallace, RCP,RRT,CHT, Sallie RCP, RRT, CHT Entered By: Dayton MartesWallace, RCP,RRT,CHT, Sallie on 12/21/2017 14:05:40 Carlos Roach, Carlos Roach ([id number]) -------------------------------------------------------------------------------- Encounter Discharge Information Details Patient Name: Carlos Roach, Carlos Roach Date of Service: 12/21/2017 2:00 PM Medical Record Number: [id number] Patient Account Number: [id number] Date of Birth/Sex: 11-15-1940 (76 y.o. M) Treating RN: Curtis Sitesorthy, Joanna Primary Care Charna Neeb: Angus PalmsGEORGE, SIONNE Other Clinician: Referring Cricket Goodlin: Angus PalmsGEORGE, SIONNE Treating Hazley Dezeeuw/Extender: Linwood DibblesSTONE III, HOYT Weeks in Treatment:  8 Encounter Discharge Information Items Discharge Condition: Stable Ambulatory Status: Walker Discharge Destination: Home Transportation: Private Auto Accompanied By: self Schedule Follow-up Appointment: No Clinical Summary of Care: Electronic Signature(s) Signed: 12/21/2017 4:15:34 PM By: Curtis Sitesorthy, Joanna Entered By: Curtis Sitesorthy, Joanna on 12/21/2017 16:15:34 Carlos Roach, Dhanush ([id number]) -------------------------------------------------------------------------------- Lower Extremity Assessment Details Patient Name: Carlos Roach, Carlos Roach Date of Service: 12/21/2017 2:00 PM Medical Record Number: [id number] Patient Account Number: [id number] Date of Birth/Sex: 11-15-1940 (76 y.o. M) Treating RN: Rema JasmineNg, Wendi Primary Care Aylssa Herrig: Angus PalmsGEORGE, SIONNE Other Clinician: Referring Delorse Shane: Angus PalmsGEORGE, SIONNE Treating Denetria Luevanos/Extender: Linwood DibblesSTONE III, HOYT Weeks in Treatment: 8 Edema Assessment Assessed: [Left: No] [Right: No] [Left: Edema] [Right: :] Calf Left: Right: Point of Measurement: 36 cm From Medial Instep 35 cm 39 cm Ankle Left: Right: Point of Measurement: 14 cm From Medial Instep 23 cm 26 cm Vascular Assessment Claudication: Claudication Assessment [Left:None] [Right:None] Pulses: Dorsalis Pedis Palpable: [Left:Yes] [Right:Yes] Posterior Tibial Extremity colors, hair growth, and conditions: Extremity Color: [Left:Normal] [Right:Normal] Temperature of Extremity: [Left:Warm] [Right:Warm] Capillary Refill: [Left:< 3 seconds] [Right:< 3 seconds] Toe Nail Assessment Left: Right: Thick: Yes Yes Discolored: Yes Yes Deformed: Yes Yes Improper Length and Hygiene: No No Electronic Signature(s) Signed: 12/21/2017 4:01:12 PM By: Rema JasmineNg, Wendi Entered By: Rema JasmineNg, Wendi on 12/21/2017 14:31:08 Carlos Roach, Carlos Roach ([id number]) -------------------------------------------------------------------------------- Multi Wound Chart Details Patient Name: Carlos Roach, Carlos Roach Date of Service: 12/21/2017 2:00 PM Medical Record  Number: [id number] Patient Account Number: [id number] Date of Birth/Sex: 11-15-1940 (76 y.o. M) Treating RN: Renne CriglerFlinchum, Cheryl Primary Care Rollen Selders: Angus PalmsGEORGE, SIONNE Other Clinician: Referring Massimiliano Rohleder: Angus PalmsGEORGE, SIONNE Treating Darnise Montag/Extender: Linwood DibblesSTONE III, HOYT Weeks in Treatment: 8 Vital Signs Height(in): 72 Pulse(bpm): 76 Weight(lbs): 252 Blood Pressure(mmHg): 123/64 Body Mass Index(BMI): 34 Temperature(F): 98.3 Respiratory Rate 18 (breaths/min): Wound Assessments Treatment Notes Electronic Signature(s) Signed: 12/22/2017 8:12:43 AM By: Renne CriglerFlinchum, Cheryl Entered By: Renne CriglerFlinchum, Cheryl on 12/21/2017 15:03:09 Carlos Roach, Carlos Roach ([id number]) -------------------------------------------------------------------------------- Multi-Disciplinary Care Plan Details Patient Name: Carlos Roach, Carlos Roach Date of Service: 12/21/2017 2:00 PM Medical Record Number: [id number] Patient Account Number: [id number] Date of Birth/Sex:  1940/09/24 (76 y.o. M) Treating RN: Renne Crigler Primary Care Daimian Sudberry: Angus Palms Other Clinician: Referring Bryceson Grape: Angus Palms Treating Federick Levene/Extender: Linwood Dibbles, HOYT Weeks in Treatment: 8 Active Inactive Electronic Signature(s) Signed: 12/21/2017 3:12:49 PM By: Renne Crigler Entered By: Renne Crigler on 12/21/2017 15:12:49 Carlos Roach ([id number]) -------------------------------------------------------------------------------- Non-Wound Condition Assessment Details Patient Name: Carlos Roach Date of Service: 12/21/2017 2:00 PM Medical Record Number: [id number] Patient Account Number: [id number] Date of Birth/Sex: 06-24-1940 (76 y.o. M) Treating RN: Rema Jasmine Primary Care Marria Mathison: Angus Palms Other Clinician: Referring Hani Campusano: Angus Palms Treating Carmino Ocain/Extender: Linwood Dibbles, HOYT Weeks in Treatment: 8 Non-Wound Condition: Condition: Other Dermatologic Condition Location: Leg Side: Bilateral Notes: bilateral leg  blisters Photos Periwound Skin Texture Texture Color No Abnormalities Noted: No No Abnormalities Noted: No Scarring: Yes Erythema: Yes Moisture Temperature / Pain No Abnormalities Noted: No Temperature: No Abnormality Dry / Scaly: No Tenderness on Palpation: Yes Maceration: No Electronic Signature(s) Signed: 12/21/2017 4:01:12 PM By: Rema Jasmine Entered By: Rema Jasmine on 12/21/2017 14:34:25 Carlos Roach ([id number]) -------------------------------------------------------------------------------- Pain Assessment Details Patient Name: Carlos Roach Date of Service: 12/21/2017 2:00 PM Medical Record Number: [id number] Patient Account Number: [id number] Date of Birth/Sex: [birth year] (76 y.o. M) Treating RN: Renne Crigler Primary Care Loranda Mastel: Angus Palms Other Clinician: Referring Cali Cuartas: Angus Palms Treating Felice Deem/Extender: Linwood Dibbles, HOYT Weeks in Treatment: 8 Active Problems Location of Pain Severity and Description of Pain Patient Has Paino No Site Locations Pain Management and Medication Current Pain Management: Electronic Signature(s) Signed: 12/21/2017 4:20:46 PM By: Dayton Martes RCP, RRT, CHT Signed: 12/22/2017 8:12:43 AM By: Renne Crigler Entered By: Dayton Martes on 12/21/2017 14:05:48 Carlos Roach ([id number]) -------------------------------------------------------------------------------- Patient/Caregiver Education Details Patient Name: Carlos Roach Date of Service: 12/21/2017 2:00 PM Medical Record Number: [id number] Patient Account Number: [id number] Date of Birth/Gender: [birth year] (77 y.o. M) Treating RN: Curtis Sites Primary Care Physician: Angus Palms Other Clinician: Referring Physician: Angus Palms Treating Physician/Extender: Skeet Simmer in Treatment: 8 Education Assessment Education Provided To: Patient Education Topics Provided Venous: Handouts: Other: how to apply  juxtalites Methods: Demonstration, Explain/Verbal Responses: State content correctly Electronic Signature(s) Signed: 12/21/2017 5:06:40 PM By: Curtis Sites Entered By: Curtis Sites on 12/21/2017 16:15:54 Carlos Roach ([id number]) -------------------------------------------------------------------------------- Vitals Details Patient Name: Carlos Roach Date of Service: 12/21/2017 2:00 PM Medical Record Number: [id number] Patient Account Number: [id number] Date of Birth/Sex: [birth year] (76 y.o. M) Treating RN: Renne Crigler Primary Care Ulric Salzman: Angus Palms Other Clinician: Referring Fransico Sciandra: Angus Palms Treating Patsy Zaragoza/Extender: Linwood Dibbles, HOYT Weeks in Treatment: 8 Vital Signs Time Taken: 14:05 Temperature (F): 98.3 Height (in): 72 Pulse (bpm): 76 Weight (lbs): 252 Respiratory Rate (breaths/min): 18 Body Mass Index (BMI): 34.2 Blood Pressure (mmHg): 123/64 Reference Range: 80 - 120 mg / dl Electronic Signature(s) Signed: 12/21/2017 4:20:46 PM By: Dayton Martes RCP, RRT, CHT Entered By: Weyman Rodney, Lucio Edward on 12/21/2017 14:08:08

## 2017-12-26 NOTE — Progress Notes (Signed)
Carlos Roach ([id number]) Visit Report for 12/21/2017 Chief Complaint Document Details Patient Name: Carlos Roach Date of Service: 12/21/2017 2:00 PM Medical Record Number: [id number] Patient Account Number: [id number] Date of Birth/Sex: [birth year] (77 y.o. M) Treating RN: Renne Crigler Primary Care Provider: Angus Palms Other Clinician: Referring Provider: Angus Palms Treating Provider/Extender: Linwood Dibbles,  Weeks in Treatment: 8 Information Obtained from: Patient Chief Complaint BLE ulcers Electronic Signature(s) Signed: 12/22/2017 8:54:03 AM By: Lenda Kelp PA-C Entered By: Lenda Kelp on 12/21/2017 13:51:48 Carlos Roach ([id number]) -------------------------------------------------------------------------------- HPI Details Patient Name: Carlos Roach Date of Service: 12/21/2017 2:00 PM Medical Record Number: [id number] Patient Account Number: [id number] Date of Birth/Sex: 08-02-1940 (76 y.o. M) Treating RN: Renne Crigler Primary Care Provider: Angus Palms Other Clinician: Referring Provider: Angus Palms Treating Provider/Extender: Linwood Dibbles,  Weeks in Treatment: 8 History of Present Illness HPI Description: 10/26/17-He presents for initial evaluation of bilateral lower extremity ulcers. He has been wearing an Unna boot compression over the last several months with minimal improvement in ulcerations. He does give a vague and somewhat confusing history regarding his lower extremity ulcers; unable to do identify if these were traumatic in nature, originally blisters, and more exact timeframe. He has not been evaluated by vascular medicine, he has elevated ABIs bilaterally in the office and will be referred to vascular medicine for evaluation. He has palpable dorsalis pedis and posttib bilaterally but we are unable to increase his compression d/t unreliable ABI results. In my opinion he would tolerate 3-4 layer compression and would have  more benefit than the unna boots. We will initiate topical treatment as some of the injuries appear traumatic with removal of dry unna wrap. He will follow up next week. He has been hospitalized with lower extremity cellulitis in the recent and remote past, most recently in March and discharged on Rocephin. He is currently on no antibiotic therapy and presents with no indication of cellulitis 11/02/17-He presents in follow up evaluation for bilateral lower extremity ulcers. He has not had unna boots, or any form of compression since last weeks appointment d/t no supplies by home health. He has arterial studies ordered for 7/29. We will continue with hydrofera blue, ace wrap and unna boots per home health 11/09/17-He presents in follow-up evaluation for bilateral lower extremity ulcers. Arterial studies performed on 7/29 revealed bilateral ABI 1.1-1.12 with bilateral TBI 0.9 and triphasic flow to the bilateral anterior tibial and posterior tibial; venous reflux studies were also performed but results are unavailable. He is improved, we will submit for compression wraps; we will continue with the same treatment plan, increasing compression to 4-layer compression wraps. He will follow up next week 11/16/17-He presents in follow-up evaluation for bilateral lower extremity ulcers. The original ulcerations have healed. There are new blisters to the right pre-tibial and left lateral leg; home health doubled compression layers and the ankle/foot layers separated/rolled. We will place 4 layer compression today, this will maintain for one week. His wife is to apply ace wrap next Thursday and the following Monday as they will be out of town. She has been encouraged to contact us with any concerns/questions/intolerances. The patient will follow up in two weeks Venous studies reviewed revealing no evidence of venous insufficiency bilaterally. 11/30/17- He presents in follow-up evaluation for bilateral lower extremity  blisters. The left lower extremity is healed; the right lower extremity has partial thickness openings to the anterior and posterior aspect with area of dry discoloration to the right lateral foot, easily removed to reveal  a partial thickness opening. We will apply compression therapy today, we will follow up next week for re-wrap and see me in two weeks. 12/14/17-He presents in follow-up evaluation for bilateral lower extremity blisters. The right lower extremity is healed and his compression sock/stocking was applied. The left lower extremity presents with an incomplete reabsorbed blister; we will continue with compression wrap. We will contact [facility] regarding compression wraps as he has not heard anything about juxtalites. 12/21/17 on evaluation today patient actually appears to be doing very well in fact he is completely healed. There's no evidence of infection at this time. He's been tolerating the dressing changes without complication. In general I do feel like he's made great progress I've seen this is the first time I've seen him but nonetheless after reviewing the notes it seems like things have gone quite well for him. He does have his bilateral Juxta-Lite compression at this point. Electronic Signature(s) Signed: 12/22/2017 8:54:03 AM By: Lenda Kelp PA-C Entered By: Lenda Kelp on 12/21/2017 15:08:55 Carlos Roach, Carlos Roach ([id number]) -------------------------------------------------------------------------------- Physical Exam Details Patient Name: Carlos Roach Date of Service: 12/21/2017 2:00 PM Medical Record Number: [id number] Patient Account Number: [id number] Date of Birth/Sex: 1941/03/31 (76 y.o. M) Treating RN: Renne Crigler Primary Care Provider: Angus Palms Other Clinician: Referring Provider: Angus Palms Treating Provider/Extender: Linwood Dibbles,  Weeks in Treatment: 8 Constitutional Well-nourished and well-hydrated in no acute  distress. Respiratory normal breathing without difficulty. Psychiatric this patient is able to make decisions and demonstrates good insight into disease process. Alert and Oriented x 3. pleasant and cooperative. Notes Patient's wounds all appear to be completely sealed and close currently there is nothing open at this time. I'm gonna recommend that we again discontinue wound care services that he will need can to you will compression going forward that's where the Juxta-Lite will come into play. Electronic Signature(s) Signed: 12/22/2017 8:54:03 AM By: Lenda Kelp PA-C Entered By: Lenda Kelp on 12/21/2017 15:09:34 Carlos Roach ([id number]) -------------------------------------------------------------------------------- Physician Orders Details Patient Name: Carlos Roach Date of Service: 12/21/2017 2:00 PM Medical Record Number: [id number] Patient Account Number: [id number] Date of Birth/Sex: Oct 24, 1940 (76 y.o. 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 Patient instructed to wear juxtalite wraps over abd pads on the shins Electronic Signature(s) Signed: 12/22/2017 8:12:43 AM By: Renne Crigler Signed: 12/22/2017 8:54:03 AM By: Lenda Kelp PA-C Entered By: Renne Crigler on 12/21/2017 15:06:13 Carlos Roach ([id number]) -------------------------------------------------------------------------------- Problem List Details Patient Name: Carlos Roach Date of Service: 12/21/2017 2:00 PM Medical Record Number: [id number] Patient Account Number: [id number] Date of Birth/Sex: 07/23/1940 (76 y.o. 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 10/26/2017 No Yes L97.511 Non-pressure chronic ulcer of other part of right foot limited to 11/30/2017 No Yes breakdown of skin R60.9 Edema, unspecified 10/26/2017 No Yes I87.333 Chronic venous hypertension (idiopathic) with ulcer and 10/26/2017 No Yes inflammation of bilateral lower extremity Inactive Problems Resolved Problems ICD-10 Code Description Active Date Resolved Date L97.211 Non-pressure chronic ulcer of right calf limited to breakdown of skin 10/26/2017 10/26/2017 L97.221 Non-pressure chronic ulcer of left calf limited to breakdown of skin 10/26/2017 10/26/2017 S80.821A Blister (nonthermal), right lower leg, initial encounter 11/16/2017 11/16/2017 K74.259DS80.822A Blister (nonthermal), left lower leg, initial encounter 11/16/2017 11/16/2017 Carlos Roach, Carlos Roach ([id number]) Electronic Signature(s) Signed: 12/22/2017 8:54:03 AM By: Lenda KelpStone III,  PA-C Entered By: Lenda KelpStone III,  on 12/21/2017 13:51:38 Carlos Roach, Carlos Roach ([id number]) -------------------------------------------------------------------------------- Progress Note Details Patient Name: Carlos Roach, Dymond Date of Service: 12/21/2017 2:00  PM Medical Record Number: [id number] Patient Account Number: [id number] Date of Birth/Sex: 27-Feb-1941 (76 y.o. M) Treating RN: Renne CriglerFlinchum, Cheryl Primary Care Provider: Angus PalmsGEORGE, SIONNE Other Clinician: Referring Provider: Angus PalmsGEORGE, SIONNE Treating Provider/Extender: Linwood DibblesSTONE III,  Weeks in Treatment: 8 Subjective Chief Complaint Information obtained from Patient BLE ulcers History of Present Illness (HPI) 10/26/17-He presents for initial evaluation of bilateral lower extremity ulcers. He has been wearing an Unna boot compression over the last several months with minimal improvement in ulcerations. He does give a vague and somewhat confusing history regarding his lower extremity ulcers; unable to do identify if these were traumatic in nature, originally blisters, and more exact timeframe. He has not been evaluated by vascular medicine, he has elevated ABIs bilaterally in the office and will be referred to vascular medicine for evaluation. He has palpable dorsalis pedis and posttib bilaterally but we are unable to increase his compression d/t unreliable ABI results. In my opinion he would tolerate 3-4 layer compression and would have more benefit than the unna boots. We will initiate topical treatment as some of the injuries appear traumatic with removal of dry unna wrap. He will follow up next week. He has been hospitalized with lower extremity cellulitis in the recent and remote past, most recently in March and discharged on Rocephin. He is currently on no antibiotic therapy and presents with no indication of cellulitis 11/02/17-He presents in follow up evaluation for bilateral lower extremity ulcers. He has not had unna boots, or any form of compression since last weeks appointment d/t no supplies by home health. He has arterial studies ordered for 7/29. We will continue with hydrofera blue, ace wrap and unna boots per home health 11/09/17-He presents in follow-up evaluation for bilateral lower  extremity ulcers. Arterial studies performed on 7/29 revealed bilateral ABI 1.1-1.12 with bilateral TBI 0.9 and triphasic flow to the bilateral anterior tibial and posterior tibial; venous reflux studies were also performed but results are unavailable. He is improved, we will submit for compression wraps; we will continue with the same treatment plan, increasing compression to 4-layer compression wraps. He will follow up next week 11/16/17-He presents in follow-up evaluation for bilateral lower extremity ulcers. The original ulcerations have healed. There are new blisters to the right pre-tibial and left lateral leg; home health doubled compression layers and the ankle/foot layers separated/rolled. We will place 4 layer compression today, this will maintain for one week. His wife is to apply ace wrap next Thursday and the following Monday as they will be out of town. She has been encouraged to contact us with any concerns/questions/intolerances. The patient will follow up in  two weeks Venous studies reviewed revealing no evidence of venous insufficiency bilaterally. 11/30/17- He presents in follow-up evaluation for bilateral lower extremity blisters. The left lower extremity is healed; the right lower extremity has partial thickness openings to the anterior and posterior aspect with area of dry discoloration to the right lateral foot, easily removed to reveal a partial thickness opening. We will apply compression therapy today, we will follow up next week for re-wrap and see me in two weeks. 12/14/17-He presents in follow-up evaluation for bilateral lower extremity blisters. The right lower extremity is healed and his compression sock/stocking was applied. The left lower extremity presents with an incomplete reabsorbed blister; we will continue with compression wrap. We will contact [facility] regarding compression wraps as he has not heard anything about juxtalites. 12/21/17 on evaluation today patient  actually appears to be doing very well in fact he is completely healed. There's no evidence of infection at this time. He's been tolerating the dressing changes without complication. In general I do feel like he's made great progress I've seen this is the first time I've seen him but nonetheless after reviewing the notes it seems like things have gone quite well for him. He does have his bilateral Juxta-Lite compression at this point. Patient History Information obtained from Patient. Carlos Roach, Carlos Roach ([id number]) Family History Cancer - Siblings, Heart Disease - Father, Hypertension - Father, No family history of Diabetes, Hereditary Spherocytosis, Kidney Disease, Lung Disease, Seizures, Stroke, Thyroid Problems, Tuberculosis. Social History Never smoker, Marital Status - Married, Alcohol Use - Never - quit in January, Drug Use - No History, Caffeine Use - Daily. Medical And Surgical History Notes Gastrointestinal hx GI bleed, ETOH use Review of Systems (ROS) Constitutional Symptoms (General Health) Denies complaints or symptoms of Fever, Chills. Respiratory The patient has no complaints or symptoms. Cardiovascular Complains or has symptoms of LE edema. Psychiatric The patient has no complaints or symptoms. Objective Constitutional Well-nourished and well-hydrated in no acute distress. Vitals Time Taken: 2:05 PM, Height: 72 in, Weight: 252 lbs, BMI: 34.2, Temperature: 98.3 F, Pulse: 76 bpm, Respiratory Rate: 18 breaths/min, Blood Pressure: 123/64 mmHg. Respiratory normal breathing without difficulty. Psychiatric this patient is able to make decisions and demonstrates good insight into disease process. Alert and Oriented x 3. pleasant and cooperative. General Notes: Patient's wounds all appear to be completely sealed and close currently there is nothing open at this time. I'm gonna recommend that we again discontinue wound care services that he will need can to you will  compression going forward that's where the Juxta-Lite will come into play. Other Condition(s) Patient presents with Other Dermatologic Condition located on the Bilateral Leg. The skin appearance exhibited: Erythema, Scarring. The skin appearance did not exhibit: Dry/Scaly, Maceration. Skin temperature was noted as No Abnormality. There is tenderness on palpation. 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 Patient instructed to wear juxtalite wraps over abd pads on the shins At this point I can recommend that we discontinue wound care measures since the patient is completely healed. I did discuss that he needs to wear his compression upon waking  until he goes to bed at night every day. This is gonna be of utmost importance as far as ensuring that everything continues to do well and remains close. He voiced understanding. We will subsequently see him back for reevaluation as needed in the future if anything changes or worsens. Electronic Signature(s) Signed: 12/22/2017 8:54:03 AM By: Lenda Kelp PA-C Entered By: Lenda Kelp on 12/21/2017 15:10:15 Carlos Roach ([id number]) -------------------------------------------------------------------------------- ROS/PFSH Details Patient Name: Carlos Roach Date of Service: 12/21/2017 2:00 PM Medical Record Number: [id number] Patient Account Number: [id number] Date of Birth/Sex: [birth year] (76 y.o. M) Treating RN: Renne Crigler Primary Care  Provider: Angus Palms Other Clinician: Referring Provider: Angus Palms Treating Provider/Extender: Linwood Dibbles,  Weeks in Treatment: 8 Information Obtained From Patient Wound History Do you currently have one or more open woundso Yes How many open wounds do you currently haveo 6 Approximately how long have you had your woundso 3 months How have you been treating your wound(s) until nowo unna boots Has your wound(s) ever healed and then re-openedo No Have you had any lab work done in the past montho No Have you tested positive for an antibiotic resistant organism (MRSA, VRE)o No Have you tested positive for osteomyelitis (bone infection)o No Have you had any tests for circulation on your legso No Constitutional Symptoms (General Health) Complaints and Symptoms: Negative for: Fever; Chills Cardiovascular Complaints and Symptoms: Positive for: LE edema Medical History: Positive for: Arrhythmia - a fib; Congestive Heart Failure; Hypertension; Peripheral Venous Disease Negative for: Angina; Coronary Artery Disease; Deep Vein Thrombosis; Hypotension; Myocardial Infarction; Peripheral Arterial Disease Eyes Medical History: Negative for: Cataracts; Glaucoma; Optic Neuritis Ear/Nose/Mouth/Throat Medical History: Negative for: Chronic sinus problems/congestion; Middle ear problems Hematologic/Lymphatic Medical History: Negative for: Anemia; Hemophilia; Human Immunodeficiency Virus; Lymphedema; Sickle Cell Disease Respiratory Complaints and Symptoms: No Complaints or Symptoms Medical History: Positive for: Sleep Apnea - CPAP Carlos Roach, Carlos Roach ([id number]) Negative for: Aspiration; Asthma; Chronic Obstructive Pulmonary Disease (COPD); Pneumothorax; Tuberculosis Gastrointestinal Medical History: Negative for: Cirrhosis ; Colitis; Crohnos; Hepatitis A; Hepatitis B; Hepatitis C Past Medical History Notes: hx GI bleed, ETOH use Endocrine Medical History: Negative for: Type I  Diabetes; Type II Diabetes Genitourinary Medical History: Negative for: End Stage Renal Disease Immunological Medical History: Negative for: Lupus Erythematosus; Raynaudos; Scleroderma Integumentary (Skin) Medical History: Negative for: History of Burn; History of pressure wounds Musculoskeletal Medical History: Negative for: Gout; Rheumatoid Arthritis; Osteoarthritis; Osteomyelitis Neurologic Medical History: Negative for: Dementia; Neuropathy; Quadriplegia; Paraplegia; Seizure Disorder Oncologic Medical History: Negative for: Received Chemotherapy; Received Radiation Psychiatric Complaints and Symptoms: No Complaints or Symptoms Medical History: Negative for: Anorexia/bulimia; Confinement Anxiety Immunizations Pneumococcal Vaccine: Received Pneumococcal Vaccination: Yes Implantable Devices Family and Social History Carlos Roach, Carlos Roach ([id number]) Cancer: Yes - Siblings; Diabetes: No; Heart Disease: Yes - Father; Hereditary Spherocytosis: No; Hypertension: Yes - Father; Kidney Disease: No; Lung Disease: No; Seizures: No; Stroke: No; Thyroid Problems: No; Tuberculosis: No; Never smoker; Marital Status - Married; Alcohol Use: Never - quit in January; Drug Use: No History; Caffeine Use: Daily; Financial Concerns: No; Food, Clothing or Shelter Needs: No; Support System Lacking: No; Transportation Concerns: No; Advanced Directives: No; Patient does not want information on Advanced Directives Physician Affirmation I have reviewed and agree with the above information. Electronic Signature(s) Signed: 12/22/2017 8:12:43 AM By: Renne Crigler Signed: 12/22/2017 8:54:03 AM By: Lenda Kelp PA-C Entered By: Lenda Kelp on 12/21/2017 15:09:13 Carlos Roach, Carlos Roach ([id number]) -------------------------------------------------------------------------------- SuperBill Details Patient Name: Carlos Roach Date of Service: 12/21/2017 Medical Record Number: [id number]  Patient Account  Number: [id number] Date of Birth/Sex: 1940-06-03 (77 y.o. 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

## 2017-12-28 ENCOUNTER — Ambulatory Visit: Payer: Medicare Other | Admitting: Physician Assistant

## 2018-12-13 ENCOUNTER — Ambulatory Visit (INDEPENDENT_AMBULATORY_CARE_PROVIDER_SITE_OTHER): Payer: Medicare Other

## 2018-12-13 ENCOUNTER — Other Ambulatory Visit: Payer: Self-pay

## 2018-12-13 ENCOUNTER — Ambulatory Visit
Admission: EM | Admit: 2018-12-13 | Discharge: 2018-12-13 | Disposition: A | Discharge status: Other Acute Inpatient Hospital | Payer: Medicare Other | Attending: Family Medicine | Admitting: Family Medicine

## 2018-12-13 DIAGNOSIS — R05 Cough: Secondary | ICD-10-CM | POA: Diagnosis not present

## 2018-12-13 DIAGNOSIS — J181 Lobar pneumonia, unspecified organism: Secondary | ICD-10-CM

## 2018-12-13 DIAGNOSIS — J90 Pleural effusion, not elsewhere classified: Secondary | ICD-10-CM | POA: Diagnosis not present

## 2018-12-13 DIAGNOSIS — J189 Pneumonia, unspecified organism: Secondary | ICD-10-CM

## 2018-12-13 NOTE — ED Provider Notes (Addendum)
[facility]    CSN: [id number] Arrival date & time: 12/13/18  1416  History   Chief Complaint Chief Complaint  Patient presents with  . Cough   HPI  78 year old male with below mentioned past medical history presents with cough.  Patient reports that he developed sore throat on Tuesday and then subsequently developed cough yesterday.  Cough has been persistent.  He does state that it seems to be improving slightly.  Associated congestion.  No documented fever.  Denies shortness of breath.  Patient does endorse lower extremity edema but states that this is chronic for him.  No medications or interventions tried.  No known exacerbating or relieving factors.  Patient concerned as his wife is immunocompromised at home.  No other associated symptoms.  No other complaints.  PMH, Surgical Hx, Family Hx, Social History reviewed and updated as below.  Past Medical History:  Diagnosis Date  . Alcohol use   . Arrhythmia    atrial fibrillation  . Cellulitis   . Chronic atrial fibrillation    a. CHADS2VASc 5 (CHF, HTN, age x 2, vascular disease)  . Chronic systolic CHF (congestive heart failure) ([place])    a. TTE 1/19: EF 50%, mild LVH, trivial MR/PR/TR, small pericardial effusion  . Chronic venous stasis   . GI bleed   . Hypertension   . Obstructive sleep apnea    Patient Active Problem List   Diagnosis Date Noted  . HTN (hypertension) 06/15/2017  . Atrial fibrillation ([place]) 06/15/2017  . CHF (congestive heart failure) ([place]) 06/04/2017   Past Surgical History:  Procedure Laterality Date  . ABDOMINAL SURGERY      Home Medications    Prior to Admission medications   Medication Sig Start Date End Date Taking? Authorizing Provider  diltiazem (CARDIZEM CD) 120 MG 24 hr capsule Take 1 capsule (120 mg total) by mouth daily. 06/11/17  Yes Salary, Avel Peace, MD  ELIQUIS 5 MG TABS tablet TK 1 T PO Q 12 H 10/04/18  Yes [provider]  FEROSUL 325 (65 Fe) MG tablet TK 1 T  PO D WITH BRE 10/01/18  Yes [provider]  folic acid (FOLVITE) 154 MCG tablet Take 400 mcg by mouth daily.   Yes [provider]  Melatonin 3 MG TBDP Take 1 tablet by mouth at bedtime.   Yes [provider]  metoprolol succinate (TOPROL-XL) 50 MG 24 hr tablet TK 1 T PO QD 10/12/18  Yes [provider]  metoprolol tartrate (LOPRESSOR) 25 MG tablet Take 25 mg by mouth every 8 (eight) hours.   Yes [provider]  Omega-3 Fatty Acids (FISH OIL) 1000 MG CAPS Take 1 capsule by mouth 3 (three) times daily.   Yes [provider]  Potassium Chloride ER 20 MEQ TBCR TK 2 TS PO QD 10/30/18  Yes [provider]  potassium chloride SA (K-DUR,KLOR-CON) 20 MEQ tablet Take 20 mEq by mouth 2 (two) times daily.   Yes [provider]  protein supplement shake (PREMIER PROTEIN) LIQD Take 325 mLs (11 oz total) by mouth 2 (two) times daily between meals. 06/10/17  Yes Salary, Avel Peace, MD  simvastatin (ZOCOR) 40 MG tablet Take 40 mg by mouth daily.   Yes [provider]  tamsulosin (FLOMAX) 0.4 MG CAPS capsule Take 0.4 mg by mouth daily after breakfast.   Yes [provider]  thiamine 100 MG tablet Take 100 mg by mouth daily.   Yes [provider]  torsemide [facility])  20 MG tablet Take 1 tablet (20 mg total) by mouth 2 (two) times daily. 06/10/17  Yes Salary, Evelena AsaMontell D, MD  Multiple Vitamin (MULTIVITAMIN) tablet Take 1 tablet by mouth daily.    [provider]    Family History Family History  Problem Relation Age of Onset  . Hypertension Father     Social History Social History   Tobacco Use  . Smoking status: Never Smoker  . Smokeless tobacco: Never Used  Substance Use Topics  . Alcohol use: Yes  . Drug use: No     Allergies   Mucinex [guaifenesin er]   Review of Systems Review of Systems  Constitutional: Negative for fever.  Respiratory: Positive for cough. Negative for shortness of breath.    Cardiovascular: Positive for leg swelling.  Skin: Positive for wound.  All other systems reviewed and are negative.  Physical Exam Triage Vital Signs ED Triage Vitals  Enc Vitals Group     BP 12/13/18 1442 108/70     Pulse Rate 12/13/18 1442 66     Resp 12/13/18 1442 17     Temp 12/13/18 1442 98.5 F (36.9 C)     Temp Source 12/13/18 1442 Oral     SpO2 12/13/18 1442 95 %     Weight 12/13/18 1439 253 lb (114.8 kg)     Height 12/13/18 1439 6' (1.829 m)     Head Circumference --      Peak Flow --      Pain Score 12/13/18 1439 0     Pain Loc --      Pain Edu? --      Excl. in GC? --    Updated Vital Signs BP 108/70 (BP Location: Left Arm)   Pulse 66   Temp 98.5 F (36.9 C) (Oral)   Resp 17   Ht 6' (1.829 m)   Wt 114.8 kg   SpO2 95%   BMI 34.31 kg/m   Visual Acuity Right Eye Distance:   Left Eye Distance:   Bilateral Distance:    Right Eye Near:   Left Eye Near:    Bilateral Near:     Physical Exam Vitals signs and nursing note reviewed.  Constitutional:      General: He is not in acute distress.    Appearance: Normal appearance. He is not ill-appearing.  HENT:     Head: Normocephalic and atraumatic.  Eyes:     General:        Right eye: No discharge.        Left eye: No discharge.     Conjunctiva/sclera: Conjunctivae normal.  Cardiovascular:     Rate and Rhythm: Normal rate. Rhythm irregularly irregular.     Heart sounds: No murmur.     Comments: Patient with venous stasis wounds on both lower extremities. Pulmonary:     Effort: Pulmonary effort is normal.     Breath sounds: No wheezing or rhonchi.     Comments: Right basilar crackles. Abdominal:     General: There is no distension.     Palpations: Abdomen is soft.     Tenderness: There is no abdominal tenderness.  Musculoskeletal:     Right lower leg: 3+ Edema present.     Left lower leg: 3+ Edema present.  Neurological:     Mental Status: He is alert.     Comments: Hard of hearing. No apparent  focal deficits.  Psychiatric:        Mood and Affect: Mood normal.  Behavior: Behavior normal.    UC Treatments / Results  Labs (all labs ordered are listed, but only abnormal results are displayed) Labs Reviewed  NOVEL CORONAVIRUS, NAA (HOSP ORDER, SEND-OUT TO REF LAB; TAT 18-24 HRS)    EKG   Radiology Dg Chest 2 View  Result Date: 12/13/2018 CLINICAL DATA:  Cough and congestion EXAM: CHEST - 2 VIEW COMPARISON:  06/06/2017 FINDINGS: Left shoulder replacement. Small left greater than right pleural effusions. Airspace disease at the left base. Cardiomegaly. Aortic atherosclerosis. No pneumothorax. IMPRESSION: 1. Small left greater than right pleural effusions. Airspace disease at the left lung base, suspect pneumonia 2. Mild cardiomegaly Electronically Signed   By: Jasmine Pang M.D.   On: 12/13/2018 15:33    Procedures Procedures (including critical care time)  Medications Ordered in UC Medications - No data to display  Initial Impression / Assessment and Plan / UC Course  I have reviewed the triage vital signs and the nursing notes.  Pertinent labs & imaging results that were available during my care of the patient were reviewed by me and considered in my medical decision making (see chart for details).    78 year old male presents with cough.  Given his clinical picture, chest x-ray was obtained and revealed bilateral pleural effusions left greater than right.  Also revealed suspected pneumonia of the left lung base.  I am concerned about COVID-19.  I feel that the patient needs a rapid test and needs diuresis in addition to treatment for pneumonia.  He is a high risk patient and needs to go to the hospital.  Wife will be transporting him via private vehicle.  Final Clinical Impressions(s) / UC Diagnoses   Final diagnoses:  Pneumonia of left lower lobe due to infectious organism [facility])  Pleural effusion     Discharge Instructions     Your wife is going to take you  to the hospital.  Take care  Dr. Adriana Simas     ED Prescriptions    None     Controlled Substance Prescriptions [place] Controlled Substance Registry consulted? Not Applicable   Tommie Sams, DO 12/13/18 1556    [address], [place], [place] 12/13/18 1558

## 2018-12-13 NOTE — Discharge Instructions (Signed)
Your wife is going to take you to the hospital.  Take care  Dr. Lacinda Axon

## 2018-12-13 NOTE — ED Triage Notes (Signed)
Patient complains of cough, congestion that started 2 days ago. Patient states that his wife is currently battling cancer and was told he should come get checked.

## 2018-12-14 LAB — NOVEL CORONAVIRUS, NAA (HOSP ORDER, SEND-OUT TO REF LAB; TAT 18-24 HRS): SARS-CoV-2, NAA: NOT DETECTED

## 2019-11-29 ENCOUNTER — Encounter: Payer: Medicare PPO | Attending: Physician Assistant | Admitting: Physician Assistant

## 2019-11-29 ENCOUNTER — Other Ambulatory Visit: Payer: Self-pay

## 2019-11-29 DIAGNOSIS — I480 Paroxysmal atrial fibrillation: Secondary | ICD-10-CM | POA: Diagnosis not present

## 2019-11-29 DIAGNOSIS — I890 Lymphedema, not elsewhere classified: Secondary | ICD-10-CM | POA: Insufficient documentation

## 2019-11-29 DIAGNOSIS — I872 Venous insufficiency (chronic) (peripheral): Secondary | ICD-10-CM | POA: Insufficient documentation

## 2019-11-29 DIAGNOSIS — I110 Hypertensive heart disease with heart failure: Secondary | ICD-10-CM | POA: Insufficient documentation

## 2019-11-29 DIAGNOSIS — G4730 Sleep apnea, unspecified: Secondary | ICD-10-CM | POA: Diagnosis not present

## 2019-11-29 DIAGNOSIS — I5042 Chronic combined systolic (congestive) and diastolic (congestive) heart failure: Secondary | ICD-10-CM | POA: Insufficient documentation

## 2019-11-29 DIAGNOSIS — L97812 Non-pressure chronic ulcer of other part of right lower leg with fat layer exposed: Secondary | ICD-10-CM | POA: Diagnosis not present

## 2019-12-04 NOTE — Progress Notes (Signed)
Carlos Roach, Carlos Roach ([id number]) Visit Report for 11/29/2019 Abuse/Suicide Risk Screen Details Patient Name: Carlos Roach, Carlos Roach Date of Service: 11/29/2019 9:15 AM Medical Record Number: [id number] Patient Account Number: [id number] Date of Birth/Sex: 1940-05-27 (78 y.o. M) Treating RN: Huel Coventry Primary Care Jadiel Schmieder: Eddie Candle Other Clinician: Referring Lafawn Lenoir: Eddie Candle Treating Jasdeep Dejarnett/Extender: Linwood Dibbles, HOYT Weeks in Treatment: 0 Abuse/Suicide Risk Screen Items Answer ABUSE RISK SCREEN: Has anyone close to you tried to hurt or harm you recentlyo No Do you feel uncomfortable with anyone in your familyo No Has anyone forced you do things that you didnot want to doo No Electronic Signature(s) Signed: 12/03/2019 7:43:28 PM By: Elliot Gurney, BSN, RN, CWS, Kim RN, BSN Entered By: Elliot Gurney, BSN, RN, CWS, Kim on 11/29/2019 10:01:26 Carlos Roach ([id number]) -------------------------------------------------------------------------------- Activities of Daily Living Details Patient Name: Carlos Roach Date of Service: 11/29/2019 9:15 AM Medical Record Number: [id number] Patient Account Number: [id number] Date of Birth/Sex: [birth year] (78 y.o. M) Treating RN: Huel Coventry Primary Care Colonel Krauser: Eddie Candle Other Clinician: Referring Jmya Uliano: Eddie Candle Treating Brea Coleson/Extender: Linwood Dibbles, HOYT Weeks in Treatment: 0 Activities of Daily Living Items Answer Activities of Daily Living (Please select one for each item) Drive Automobile Not Able Take Medications Need Assistance Use Telephone Need Assistance Care for Appearance Need Assistance Use Toilet Completely Able Bath / Shower Need Assistance Dress Self Need Assistance Feed Self Need Assistance Walk Need Assistance Get In / Out Bed Need Assistance Housework Need Assistance Prepare Meals Need Assistance Handle Money Need Assistance Shop for Self Need Assistance Electronic Signature(s) Signed: 12/03/2019 7:43:28 PM By:  Elliot Gurney, BSN, RN, CWS, Kim RN, BSN Entered By: Elliot Gurney, BSN, RN, CWS, Kim on 11/29/2019 10:01:53 Carlos Roach ([id number]) -------------------------------------------------------------------------------- Education Screening Details Patient Name: Carlos Roach Date of Service: 11/29/2019 9:15 AM Medical Record Number: [id number] Patient Account Number: [id number] Date of Birth/Sex: 06/11/1940 (78 y.o. M) Treating RN: Huel Coventry Primary Care Chaske Paskett: Eddie Candle Other Clinician: Referring Raelan Burgoon: Eddie Candle Treating Lakea Mittelman/Extender: Skeet Simmer in Treatment: 0 Primary Learner Assessed: Patient Learning Preferences/Education Level/Primary Language Learning Preference: Explanation, Demonstration Highest Education Level: College or Above Preferred Language: English Cognitive Barrier Language Barrier: No Translator Needed: No Memory Deficit: No Emotional Barrier: No Physical Barrier Impaired Vision: No Impaired Hearing: No Decreased Hand dexterity: No Knowledge/Comprehension Knowledge Level: High Comprehension Level: High Ability to understand written instructions: High Ability to understand verbal instructions: High Motivation Anxiety Level: Calm Cooperation: Cooperative Education Importance: Acknowledges Need Interest in Health Problems: Asks Questions Perception: Coherent Willingness to Engage in Self-Management High Activities: Readiness to Engage in Self-Management High Activities: Psychologist, prison and probation services) Signed: 12/03/2019 7:43:28 PM By: Elliot Gurney, BSN, RN, CWS, Kim RN, BSN Entered By: Elliot Gurney, BSN, RN, CWS, Kim on 11/29/2019 10:02:27 Carlos Roach ([id number]) -------------------------------------------------------------------------------- Fall Risk Assessment Details Patient Name: Carlos Roach Date of Service: 11/29/2019 9:15 AM Medical Record Number: [id number] Patient Account Number: [id number] Date of Birth/Sex: [birth year] (78 y.o.  M) Treating RN: Huel Coventry Primary Care Cassandr Cederberg: Eddie Candle Other Clinician: Referring Tareek Sabo: Eddie Candle Treating Sadler Teschner/Extender: Linwood Dibbles, HOYT Weeks in Treatment: 0 Fall Risk Assessment Items Have you had 2 or more falls in the last 12 monthso 0 Yes Have you had any fall that resulted in injury in the last 12 monthso 0 No FALLS RISK SCREEN History of falling - immediate or within 3 months 0 No Secondary diagnosis (Do you have 2 or more medical diagnoseso) 15 Yes Ambulatory aid None/bed rest/wheelchair/nurse 0 No Crutches/cane/walker 0 No Furniture 0 No Intravenous therapy Access/Saline/Heparin Lock 0  No Gait/Transferring Normal/ bed rest/ wheelchair 0 No Weak (short steps with or without shuffle, stooped but able to lift head while walking, may 10 Yes seek support from furniture) Impaired (short steps with shuffle, may have difficulty arising from chair, head down, impaired 0 No balance) Mental Status Oriented to own ability 0 Yes Electronic Signature(s) Signed: 12/03/2019 7:43:28 PM By: Elliot Gurney, BSN, RN, CWS, Kim RN, BSN Entered By: Elliot Gurney, BSN, RN, CWS, Kim on 11/29/2019 10:03:04 Carlos Roach ([id number]) -------------------------------------------------------------------------------- Foot Assessment Details Patient Name: Carlos Roach Date of Service: 11/29/2019 9:15 AM Medical Record Number: [id number] Patient Account Number: [id number] Date of Birth/Sex: Dec 31, 1940 (78 y.o. M) Treating RN: Huel Coventry Primary Care Tameah Mihalko: Eddie Candle Other Clinician: Referring Lamin Chandley: Eddie Candle Treating Kenneth Lax/Extender: Linwood Dibbles, HOYT Weeks in Treatment: 0 Foot Assessment Items Site Locations + = Sensation present, - = Sensation absent, C = Callus, U = Ulcer R = Redness, W = Warmth, M = Maceration, PU = Pre-ulcerative lesion F = Fissure, S = Swelling, D = Dryness Assessment Right: Left: Other Deformity: No No Prior Foot Ulcer: No No Prior Amputation:  No No Charcot Joint: No No Ambulatory Status: Ambulatory Without Help Gait: Steady Electronic Signature(s) Signed: 12/03/2019 7:43:28 PM By: Elliot Gurney, BSN, RN, CWS, Kim RN, BSN Entered By: Elliot Gurney, BSN, RN, CWS, Kim on 11/29/2019 10:05:32 Carlos Roach ([id number]) -------------------------------------------------------------------------------- Nutrition Risk Screening Details Patient Name: Carlos Roach Date of Service: 11/29/2019 9:15 AM Medical Record Number: [id number] Patient Account Number: [id number] Date of Birth/Sex: [birth year] (78 y.o. M) Treating RN: Huel Coventry Primary Care Dylynn Ketner: Eddie Candle Other Clinician: Referring Nayanna Seaborn: Eddie Candle Treating Bruce Churilla/Extender: Linwood Dibbles, HOYT Weeks in Treatment: 0 Height (in): 72 Weight (lbs): 251 Body Mass Index (BMI): 34 Nutrition Risk Screening Items Score Screening NUTRITION RISK SCREEN: I have an illness or condition that made me change the kind and/or amount of food I eat 0 No I eat fewer than two meals per day 0 No I eat few fruits and vegetables, or milk products 0 No I have three or more drinks of beer, liquor or wine almost every day 0 No I have tooth or mouth problems that make it hard for me to eat 0 No I don't always have enough money to buy the food I need 0 No I eat alone most of the time 0 No I take three or more different prescribed or over-the-counter drugs a day 0 No Without wanting to, I have lost or gained 10 pounds in the last six months 0 No I am not always physically able to shop, cook and/or feed myself 0 No Nutrition Protocols Good Risk Protocol 0 No interventions needed Moderate Risk Protocol High Risk Proctocol Risk Level: Good Risk Score: 0 Electronic Signature(s) Signed: 12/03/2019 7:43:28 PM By: Elliot Gurney, BSN, RN, CWS, Kim RN, BSN Entered By: Elliot Gurney, BSN, RN, CWS, Kim on 11/29/2019 10:03:14

## 2019-12-04 NOTE — Progress Notes (Signed)
Carlos Roach ([id number]) Visit Report for 11/29/2019 Chief Complaint Document Details Patient Name: Carlos Roach Date of Service: 11/29/2019 9:15 AM Medical Record Number: [id number] Patient Account Number: [id number] Date of Birth/Sex: [birth year] (78 y.o. M) Treating RN: Huel Coventry Primary Care Provider: Eddie Candle Other Clinician: Referring Provider: Eddie Candle Treating Provider/Extender: Linwood Dibbles, Freja Faro Weeks in Treatment: 0 Information Obtained from: Patient Chief Complaint Right LE ulcers Electronic Signature(s) Signed: 11/29/2019 10:11:40 AM By: Lenda Kelp PA-C Entered By: Lenda Kelp on 11/29/2019 10:11:39 Carlos Roach ([id number]) -------------------------------------------------------------------------------- HPI Details Patient Name: Carlos Roach Date of Service: 11/29/2019 9:15 AM Medical Record Number: [id number] Patient Account Number: [id number] Date of Birth/Sex: 03-Feb-1941 (78 y.o. M) Treating RN: Huel Coventry Primary Care Provider: Eddie Candle Other Clinician: Referring Provider: Eddie Candle Treating Provider/Extender: Linwood Dibbles, Jhayden Demuro Weeks in Treatment: 0 History of Present Illness HPI Description: 10/26/17-He presents for initial evaluation of bilateral lower extremity ulcers. He has been wearing an Unna boot compression over the last several months with minimal improvement in ulcerations. He does give a vague and somewhat confusing history regarding his lower extremity ulcers; unable to do identify if these were traumatic in nature, originally blisters, and more exact timeframe. He has not been evaluated by vascular medicine, he has elevated ABIs bilaterally in the office and will be referred to vascular medicine for evaluation. He has palpable dorsalis pedis and posttib bilaterally but we are unable to increase his compression d/t unreliable ABI results. In my opinion he would tolerate 3-4 layer compression and would have more benefit  than the unna boots. We will initiate topical treatment as some of the injuries appear traumatic with removal of dry unna wrap. He will follow up next week. He has been hospitalized with lower extremity cellulitis in the recent and remote past, most recently in March and discharged on Rocephin. He is currently on no antibiotic therapy and presents with no indication of cellulitis 11/02/17-He presents in follow up evaluation for bilateral lower extremity ulcers. He has not had unna boots, or any form of compression since last weeks appointment d/t no supplies by home health. He has arterial studies ordered for 7/29. We will continue with hydrofera blue, ace wrap and unna boots per home health 11/09/17-He presents in follow-up evaluation for bilateral lower extremity ulcers. Arterial studies performed on 7/29 revealed bilateral ABI 1.1-1.12 with bilateral TBI 0.9 and triphasic flow to the bilateral anterior tibial and posterior tibial; venous reflux studies were also performed but results are unavailable. He is improved, we will submit for compression wraps; we will continue with the same treatment plan, increasing compression to 4-layer compression wraps. He will follow up next week 11/16/17-He presents in follow-up evaluation for bilateral lower extremity ulcers. The original ulcerations have healed. There are new blisters to the right pre-tibial and left lateral leg; home health doubled compression layers and the ankle/foot layers separated/rolled. We will place 4 layer compression today, this will maintain for one week. His wife is to apply ace wrap next Thursday and the following Monday as they will be out of town. She has been encouraged to contact us with any concerns/questions/intolerances. The patient will follow up in two weeks Venous studies reviewed revealing no evidence of venous insufficiency bilaterally. 11/30/17- He presents in follow-up evaluation for bilateral lower extremity blisters. The  left lower extremity is healed; the right lower extremity has partial thickness openings to the anterior and posterior aspect with area of dry discoloration to the right lateral foot, easily removed to  reveal a partial thickness opening. We will apply compression therapy today, we will follow up next week for re-wrap and see me in two weeks. 12/14/17-He presents in follow-up evaluation for bilateral lower extremity blisters. The right lower extremity is healed and his compression sock/stocking was applied. The left lower extremity presents with an incomplete reabsorbed blister; we will continue with compression wrap. We will contact [facility] regarding compression wraps as he has not heard anything about juxtalites. 12/21/17 on evaluation today patient actually appears to be doing very well in fact he is completely healed. There's no evidence of infection at this time. He's been tolerating the dressing changes without complication. In general I do feel like he's made great progress I've seen this is the first time I've seen him but nonetheless after reviewing the notes it seems like things have gone quite well for him. He does have his bilateral Juxta- Lite compression at this point. Readmission: 11/29/2019 upon evaluation today patient appears to actually be doing quite well with regard to his bilateral lower extremities. Unfortunately he tells me that he has been having a lot of issues even over the past 3 years since we last saw him with his legs. He sometimes will be doing okay and sometimes will have more significant issues. Right now home health has been for the past couple weeks wrapping his legs and that has made a tremendous improvement they have been using Unna boot wraps bilaterally. With that being said the patient's wounds currently are minimal compared to what they did in fact he has one area on the right lateral lower extremity but in general he seems to be doing quite well. Fortunately there  is no signs of infection at this time. No fevers, chills, nausea, vomiting, or diarrhea. The patient does have a history of congestive heart failure, chronic venous insufficiency, lymphedema, hypertension, and atrial fibrillation. Electronic Signature(s) Signed: 11/29/2019 10:26:32 AM By: Lenda Kelp PA-C Entered By: Lenda Kelp on 11/29/2019 10:26:31 BENJAMYN, HESTAND ([id number]) -------------------------------------------------------------------------------- Physical Exam Details Patient Name: Carlos Roach Date of Service: 11/29/2019 9:15 AM Medical Record Number: [id number] Patient Account Number: [id number] Date of Birth/Sex: [birth year] (78 y.o. M) Treating RN: Huel Coventry Primary Care Provider: Eddie Candle Other Clinician: Referring Provider: Eddie Candle Treating Provider/Extender: Linwood Dibbles, Daziya Redmond Weeks in Treatment: 0 Constitutional sitting or standing blood pressure is within target range for patient.. pulse regular and within target range for patient.Marland Kitchen respirations regular, non- labored and within target range for patient.Marland Kitchen temperature within target range for patient.. Well-nourished and well-hydrated in no acute distress. Eyes conjunctiva clear no eyelid edema noted. pupils equal round and reactive to light and accommodation. Ears, Nose, Mouth, and Throat no gross abnormality of ear auricles or external auditory canals. normal hearing noted during conversation. mucus membranes moist. Respiratory normal breathing without difficulty. Cardiovascular 1+ dorsalis pedis/posterior tibialis pulses. Patient has bilateral stage III lymphedema.. Musculoskeletal unsteady while walking. no significant deformity or arthritic changes, no loss or range of motion, no clubbing. Psychiatric this patient is able to make decisions and demonstrates good insight into disease process. Alert and Oriented x 3. pleasant and cooperative. Notes On inspection patient has a small wound on the  right lateral lower extremity he has some minimal blisters noted other locations but in general seems to be doing quite well. Fortunately there is no evidence of active infection at this time his blood pressure is measuring extremely well currently and in general I feel like he is making good progress I feel  like the Unna boot wraps at home health initiated have done extremely well for him that is great news. His pulses are difficult to obtain simply due to the fact that he has swelling in his feet still. Electronic Signature(s) Signed: 11/29/2019 10:28:40 AM By: Lenda Kelp PA-C Previous Signature: 11/29/2019 10:27:09 AM Version By: Lenda Kelp PA-C Entered By: Lenda Kelp on 11/29/2019 10:28:39 GRAESON, NOURI ([id number]) -------------------------------------------------------------------------------- Physician Orders Details Patient Name: Carlos Roach Date of Service: 11/29/2019 9:15 AM Medical Record Number: [id number] Patient Account Number: [id number] Date of Birth/Sex: 04-05-1941 (78 y.o. M) Treating RN: Huel Coventry Primary Care Provider: Eddie Candle Other Clinician: Referring Provider: Eddie Candle Treating Provider/Extender: Linwood Dibbles, Makye Radle Weeks in Treatment: 0 Verbal / Phone Orders: No Diagnosis Coding ICD-10 Coding Code Description I87.2 Venous insufficiency (chronic) (peripheral) L97.812 Non-pressure chronic ulcer of other part of right lower leg with fat layer exposed I10 Essential (primary) hypertension I50.42 Chronic combined systolic (congestive) and diastolic (congestive) heart failure I48.0 Paroxysmal atrial fibrillation Wound Cleansing Wound #9 Right,Lateral Lower Leg o Cleanse wound with mild soap and water Primary Wound Dressing o Silver Alginate Secondary Dressing o Dry Gauze Dressing Change Frequency Wound #9 Right,Lateral Lower Leg o Dressing is to be changed Monday and Thursday. Follow-up Appointments Wound #9 Right,Lateral  Lower Leg o Return Appointment in 1 month Edema Control Wound #9 Right,Lateral Lower Leg o Unna Boot to Right Lower Extremity o Patient to wear own compression stockings - Left-may apply unna boot if needed. Home Health Wound #9 Right,Lateral Lower Leg o Continue [facility] Visits - [facility] o Home Health Nurse may visit PR[facility] to address patientos wound care needs. o FACE TO FACE ENCOUNTER: MEDICARE and MEDICAID PATIENTS: I certify that this patient is under my care and that I had a face-to-face encounter that meets the physician face-to-face encounter requirements with this patient on this date. The encounter with the patient was in whole or in part for the following MEDICAL CONDITION: (primary reason for Home Healthcare) MEDICAL NECESSITY: I certify, that based on my findings, NURSING services are a medically necessary home health service. HOME BOUND STATUS: I certify that my clinical findings support that this patient is homebound (i.e., Due to illness or injury, pt requires aid of supportive devices such as crutches, cane, wheelchairs, walkers, the use of special transportation or the assistance of another person to leave their place of residence. There is a normal inability to leave the home and doing so requires considerable and taxing effort. Other absences are for medical reasons / religious services and are infrequent or of short duration when for other reasons). o If current dressing causes regression in wound condition, may D/C ordered dressing product/s and apply Normal Saline Moist Dressing daily until next Wound Healing Center / Other MD appointment. Notify Wound Healing Center of regression in wound condition at [phone]. o Please direct any NON-WOUND related issues/requests for orders to patient's Primary Care Physician Electronic Signature(s) Signed: 11/29/2019 5:28:43 PM By: Cecilie Kicks, Rocklin ([id number]) Signed: 12/03/2019 7:43:28 PM  By: Elliot Gurney, BSN, RN, CWS, Kim RN, BSN Entered By: Elliot Gurney, BSN, RN, CWS, Kim on 11/29/2019 10:23:13 Carlos Roach ([id number]) -------------------------------------------------------------------------------- Problem List Details Patient Name: Carlos Roach Date of Service: 11/29/2019 9:15 AM Medical Record Number: [id number] Patient Account Number: [id number] Date of Birth/Sex: 05-15-1940 (78 y.o. M) Treating RN: Huel Coventry Primary Care Provider: Eddie Candle Other Clinician: Referring Provider: Eddie Candle Treating Provider/Extender: Linwood Dibbles, Tommaso Cavitt Weeks in Treatment: 0 Active Problems ICD-10  Encounter Code Description Active Date MDM Diagnosis I89.0 Lymphedema, not elsewhere classified 11/29/2019 No Yes I87.2 Venous insufficiency (chronic) (peripheral) 11/29/2019 No Yes L97.812 Non-pressure chronic ulcer of other part of right lower leg with fat layer 11/29/2019 No Yes exposed I10 Essential (primary) hypertension 11/29/2019 No Yes I50.42 Chronic combined systolic (congestive) and diastolic (congestive) heart 11/29/2019 No Yes failure I48.0 Paroxysmal atrial fibrillation 11/29/2019 No Yes Inactive Problems Resolved Problems Electronic Signature(s) Signed: 11/29/2019 10:26:19 AM By: Lenda Kelp PA-C Previous Signature: 11/29/2019 10:11:21 AM Version By: Lenda Kelp PA-C Entered By: Lenda Kelp on 11/29/2019 10:26:19 Carlos Roach ([id number]) -------------------------------------------------------------------------------- Progress Note Details Patient Name: Carlos Roach Date of Service: 11/29/2019 9:15 AM Medical Record Number: [id number] Patient Account Number: [id number] Date of Birth/Sex: Jul 16, 1940 (78 y.o. M) Treating RN: Huel Coventry Primary Care Provider: Eddie Candle Other Clinician: Referring Provider: Eddie Candle Treating Provider/Extender: Linwood Dibbles, Salam Micucci Weeks in Treatment: 0 Subjective Chief Complaint Information obtained from Patient Right LE  ulcers History of Present Illness (HPI) 10/26/17-He presents for initial evaluation of bilateral lower extremity ulcers. He has been wearing an Unna boot compression over the last several months with minimal improvement in ulcerations. He does give a vague and somewhat confusing history regarding his lower extremity ulcers; unable to do identify if these were traumatic in nature, originally blisters, and more exact timeframe. He has not been evaluated by vascular medicine, he has elevated ABIs bilaterally in the office and will be referred to vascular medicine for evaluation. He has palpable dorsalis pedis and posttib bilaterally but we are unable to increase his compression d/t unreliable ABI results. In my opinion he would tolerate 3-4 layer compression and would have more benefit than the unna boots. We will initiate topical treatment as some of the injuries appear traumatic with removal of dry unna wrap. He will follow up next week. He has been hospitalized with lower extremity cellulitis in the recent and remote past, most recently in March and discharged on Rocephin. He is currently on no antibiotic therapy and presents with no indication of cellulitis 11/02/17-He presents in follow up evaluation for bilateral lower extremity ulcers. He has not had unna boots, or any form of compression since last weeks appointment d/t no supplies by home health. He has arterial studies ordered for 7/29. We will continue with hydrofera blue, ace wrap and unna boots per home health 11/09/17-He presents in follow-up evaluation for bilateral lower extremity ulcers. Arterial studies performed on 7/29 revealed bilateral ABI 1.1-1.12 with bilateral TBI 0.9 and triphasic flow to the bilateral anterior tibial and posterior tibial; venous reflux studies were also performed but results are unavailable. He is improved, we will submit for compression wraps; we will continue with the same treatment plan, increasing compression  to 4-layer compression wraps. He will follow up next week 11/16/17-He presents in follow-up evaluation for bilateral lower extremity ulcers. The original ulcerations have healed. There are new blisters to the right pre-tibial and left lateral leg; home health doubled compression layers and the ankle/foot layers separated/rolled. We will place 4 layer compression today, this will maintain for one week. His wife is to apply ace wrap next Thursday and the following Monday as they will be out of town. She has been encouraged to contact us with any concerns/questions/intolerances. The patient will follow up in two weeks Venous studies reviewed revealing no evidence of venous insufficiency bilaterally. 11/30/17- He presents in follow-up evaluation for bilateral lower extremity blisters. The left lower extremity is healed; the right lower  extremity has partial thickness openings to the anterior and posterior aspect with area of dry discoloration to the right lateral foot, easily removed to reveal a partial thickness opening. We will apply compression therapy today, we will follow up next week for re-wrap and see me in two weeks. 12/14/17-He presents in follow-up evaluation for bilateral lower extremity blisters. The right lower extremity is healed and his compression sock/stocking was applied. The left lower extremity presents with an incomplete reabsorbed blister; we will continue with compression wrap. We will contact [facility] regarding compression wraps as he has not heard anything about juxtalites. 12/21/17 on evaluation today patient actually appears to be doing very well in fact he is completely healed. There's no evidence of infection at this time. He's been tolerating the dressing changes without complication. In general I do feel like he's made great progress I've seen this is the first time I've seen him but nonetheless after reviewing the notes it seems like things have gone quite well for him. He does  have his bilateral Juxta- Lite compression at this point. Readmission: 11/29/2019 upon evaluation today patient appears to actually be doing quite well with regard to his bilateral lower extremities. Unfortunately he tells me that he has been having a lot of issues even over the past 3 years since we last saw him with his legs. He sometimes will be doing okay and sometimes will have more significant issues. Right now home health has been for the past couple weeks wrapping his legs and that has made a tremendous improvement they have been using Unna boot wraps bilaterally. With that being said the patient's wounds currently are minimal compared to what they did in fact he has one area on the right lateral lower extremity but in general he seems to be doing quite well. Fortunately there is no signs of infection at this time. No fevers, chills, nausea, vomiting, or diarrhea. The patient does have a history of congestive heart failure, chronic venous insufficiency, lymphedema, hypertension, and atrial fibrillation. Patient History Information obtained from Patient. Allergies Mucinex Family History Cancer - Siblings, Heart Disease - Father, Hypertension - Father, No family history of Diabetes, Hereditary Spherocytosis, Kidney Disease, Lung Disease, Seizures, Stroke, Thyroid Problems, Tuberculosis. Social History Never smoker, Marital Status - Married, Alcohol Use - Never - quit in January, Drug Use - No History, Caffeine Use - Daily. Medical History Eyes Denies history of Cataracts, Glaucoma, Optic Neuritis LESSLIE, MCKEEHAN ([id number]) Ear/Nose/Mouth/Throat Denies history of Chronic sinus problems/congestion, Middle ear problems Hematologic/Lymphatic Denies history of Anemia, Hemophilia, Human Immunodeficiency Virus, Lymphedema, Sickle Cell Disease Respiratory Patient has history of Sleep Apnea - CPAP Denies history of Aspiration, Asthma, Chronic Obstructive Pulmonary Disease (COPD),  Pneumothorax, Tuberculosis Cardiovascular Patient has history of Arrhythmia - a fib, Congestive Heart Failure, Hypertension, Peripheral Venous Disease Denies history of Angina, Coronary Artery Disease, Deep Vein Thrombosis, Hypotension, Myocardial Infarction, Peripheral Arterial Disease Gastrointestinal Denies history of Cirrhosis , Colitis, Crohn s, Hepatitis A, Hepatitis B, Hepatitis C Endocrine Denies history of Type I Diabetes, Type II Diabetes Genitourinary Denies history of End Stage Renal Disease Immunological Denies history of Lupus Erythematosus, Raynaud s, Scleroderma Integumentary (Skin) Denies history of History of Burn, History of pressure wounds Musculoskeletal Denies history of Gout, Rheumatoid Arthritis, Osteoarthritis, Osteomyelitis Neurologic Denies history of Dementia, Neuropathy, Quadriplegia, Paraplegia, Seizure Disorder Oncologic Denies history of Received Chemotherapy, Received Radiation Psychiatric Denies history of Anorexia/bulimia, Confinement Anxiety Medical And Surgical History Notes Gastrointestinal hx GI bleed, ETOH use Objective Constitutional sitting or standing  blood pressure is within target range for patient.. pulse regular and within target range for patient.Marland Kitchen. respirations regular, non- labored and within target range for patient.Marland Kitchen. temperature within target range for patient.. Well-nourished and well-hydrated in no acute distress. Vitals Time Taken: 9:58 AM, Height: 72 in, Weight: 251 lbs, BMI: 34, Temperature: 98.1 F, Pulse: 93 bpm, Respiratory Rate: 20 breaths/min, Blood Pressure: 119/59 mmHg. Eyes conjunctiva clear no eyelid edema noted. pupils equal round and reactive to light and accommodation. Ears, Nose, Mouth, and Throat no gross abnormality of ear auricles or external auditory canals. normal hearing noted during conversation. mucus membranes moist. Respiratory normal breathing without difficulty. Cardiovascular 1+ dorsalis  pedis/posterior tibialis pulses. Patient has bilateral stage III lymphedema.. Musculoskeletal unsteady while walking. no significant deformity or arthritic changes, no loss or range of motion, no clubbing. Psychiatric this patient is able to make decisions and demonstrates good insight into disease process. Alert and Oriented x 3. pleasant and cooperative. General Notes: On inspection patient has a small wound on the right lateral lower extremity he has some minimal blisters noted other locations but in general seems to be doing quite well. Fortunately there is no evidence of active infection at this time his blood pressure is measuring extremely well currently and in general I feel like he is making good progress I feel like the Unna boot wraps at home health initiated have done extremely well for him that is great news. His pulses are difficult to obtain simply due to the fact that he has swelling in his feet still. Integumentary (Hair, Skin) Lovell SheehanJENKINS, Regino ([id number]) Wound #9 status is Open. Original cause of wound was Blister. The wound is located on the Right,Lateral Lower Leg. The wound measures 0.3cm length x 0.2cm width x 0.1cm depth; 0.047cm^2 area and 0.005cm^3 volume. The wound is limited to skin breakdown. There is no tunneling or undermining noted. There is a medium amount of serous drainage noted. The wound margin is indistinct and nonvisible. There is no granulation within the wound bed. There is no necrotic tissue within the wound bed. Assessment Active Problems ICD-10 Lymphedema, not elsewhere classified Venous insufficiency (chronic) (peripheral) Non-pressure chronic ulcer of other part of right lower leg with fat layer exposed Essential (primary) hypertension Chronic combined systolic (congestive) and diastolic (congestive) heart failure Paroxysmal atrial fibrillation Procedures Wound #9 Pre-procedure diagnosis of Wound #9 is a Venous Leg Ulcer located on the  Right,Lateral Lower Leg . There was a Radio broadcast assistantUnna Boot Compression Therapy Procedure with a pre-treatment ABI of 1.3 by Huel CoventryWoody, Kim, RN. Post procedure Diagnosis Wound #9: Same as Pre-Procedure Plan Wound Cleansing: Wound #9 Right,Lateral Lower Leg: Cleanse wound with mild soap and water Primary Wound Dressing: Silver Alginate Secondary Dressing: Dry Gauze Dressing Change Frequency: Wound #9 Right,Lateral Lower Leg: Dressing is to be changed Monday and Thursday. Follow-up Appointments: Wound #9 Right,Lateral Lower Leg: Return Appointment in 1 month Edema Control: Wound #9 Right,Lateral Lower Leg: Unna Boot to Right Lower Extremity Patient to wear own compression stockings - Left-may apply unna boot if needed. Home Health: Wound #9 Right,Lateral Lower Leg: Continue Home Health Visits - [facility] Nurse may visit PRN to address patient s wound care needs. FACE TO FACE ENCOUNTER: MEDICARE and MEDICAID PATIENTS: I certify that this patient is under my care and that I had a face-to-face encounter that meets the physician face-to-face encounter requirements with this patient on this date. The encounter with the patient was in whole or in part for the following MEDICAL CONDITION: (  primary reason for Home Healthcare) MEDICAL NECESSITY: I certify, that based on my findings, NURSING services are a medically necessary home health service. HOME BOUND STATUS: I certify that my clinical findings support that this patient is homebound (i.e., Due to illness or injury, pt requires aid of supportive devices such as crutches, cane, wheelchairs, walkers, the use of special transportation or the assistance of another person to leave their place of residence. There is a normal inability to leave the home and doing so requires considerable and taxing effort. Other absences are for medical reasons / religious services and are infrequent or of short duration when for other reasons). If current dressing  causes regression in wound condition, may D/C ordered dressing product/s and apply Normal Saline Moist Dressing daily until next Wound Healing Center / Other MD appointment. Notify Wound Healing Center of regression in wound condition at [phone]. Please direct any NON-WOUND related issues/requests for orders to patient's Primary Care Physician JAVARIOUS, ELSAYED ([id number]) 1. I would recommend at this time that we go ahead and continue with the Unna boot wraps that have seem to have done extremely well for him. Patient is in agreement with that plan. 2. I am also can recommend that he continue with a silver alginate dressing to put over any open or draining areas I think that is appropriate as well. 3. I would also recommend the patient continue to monitor for any signs of active infection. And home health will help in this regard as well. He is not to be able to come in quite as often simply due to the fact that his wife is getting ready to have a bone marrow transplant and is going to be having a more difficult time they only have each other and therefore there is no other family to really aid in getting him to appointments. Fortunately has home health which I think will do excellent for him. We will see patient back for reevaluation in 4 weeks here in the clinic. If anything worsens or changes patient will contact our office for additional recommendations. Electronic Signature(s) Signed: 11/29/2019 10:28:59 AM By: Lenda Kelp PA-C Entered By: Lenda Kelp on 11/29/2019 10:28:59 KEMO, SPRUCE ([id number]) -------------------------------------------------------------------------------- ROS/PFSH Details Patient Name: Carlos Roach Date of Service: 11/29/2019 9:15 AM Medical Record Number: [id number] Patient Account Number: [id number] Date of Birth/Sex: October 17, 1940 (78 y.o. M) Treating RN: Huel Coventry Primary Care Provider: Eddie Candle Other Clinician: Referring Provider:  Eddie Candle Treating Provider/Extender: Linwood Dibbles, Reda Gettis Weeks in Treatment: 0 Information Obtained From Patient Eyes Medical History: Negative for: Cataracts; Glaucoma; Optic Neuritis Ear/Nose/Mouth/Throat Medical History: Negative for: Chronic sinus problems/congestion; Middle ear problems Hematologic/Lymphatic Medical History: Negative for: Anemia; Hemophilia; Human Immunodeficiency Virus; Lymphedema; Sickle Cell Disease Respiratory Medical History: Positive for: Sleep Apnea - CPAP Negative for: Aspiration; Asthma; Chronic Obstructive Pulmonary Disease (COPD); Pneumothorax; Tuberculosis Cardiovascular Medical History: Positive for: Arrhythmia - a fib; Congestive Heart Failure; Hypertension; Peripheral Venous Disease Negative for: Angina; Coronary Artery Disease; Deep Vein Thrombosis; Hypotension; Myocardial Infarction; Peripheral Arterial Disease Gastrointestinal Medical History: Negative for: Cirrhosis ; Colitis; Crohnos; Hepatitis A; Hepatitis B; Hepatitis C Past Medical History Notes: hx GI bleed, ETOH use Endocrine Medical History: Negative for: Type I Diabetes; Type II Diabetes Genitourinary Medical History: Negative for: End Stage Renal Disease Immunological Medical History: Negative for: Lupus Erythematosus; Raynaudos; Scleroderma Integumentary (Skin) Medical History: Negative for: History of Burn; History of pressure wounds Musculoskeletal MONTRELL, CESSNA ([id number]) Medical History: Negative for: Gout; Rheumatoid Arthritis; Osteoarthritis; Osteomyelitis Neurologic Medical  History: Negative for: Dementia; Neuropathy; Quadriplegia; Paraplegia; Seizure Disorder Oncologic Medical History: Negative for: Received Chemotherapy; Received Radiation Psychiatric Medical History: Negative for: Anorexia/bulimia; Confinement Anxiety Immunizations Pneumococcal Vaccine: Received Pneumococcal Vaccination: Yes Implantable Devices No devices added Family and Social  History Cancer: Yes - Siblings; Diabetes: No; Heart Disease: Yes - Father; Hereditary Spherocytosis: No; Hypertension: Yes - Father; Kidney Disease: No; Lung Disease: No; Seizures: No; Stroke: No; Thyroid Problems: No; Tuberculosis: No; Never smoker; Marital Status - Married; Alcohol Use: Never - quit in January; Drug Use: No History; Caffeine Use: Daily; Financial Concerns: No; Food, Clothing or Shelter Needs: No; Support System Lacking: No; Transportation Concerns: No Electronic Signature(s) Signed: 11/29/2019 5:28:43 PM By: Lenda Kelp PA-C Signed: 12/03/2019 7:43:28 PM By: Elliot Gurney, BSN, RN, CWS, Kim RN, BSN Entered By: Elliot Gurney, BSN, RN, CWS, Kim on 11/29/2019 10:01:19 Carlos Roach ([id number]) -------------------------------------------------------------------------------- SuperBill Details Patient Name: Carlos Roach Date of Service: 11/29/2019 Medical Record Number: [id number] Patient Account Number: [id number] Date of Birth/Sex: 1940-11-19 (78 y.o. M) Treating RN: Huel Coventry Primary Care Provider: Eddie Candle Other Clinician: Referring Provider: Eddie Candle Treating Provider/Extender: Linwood Dibbles, Maurion [place]ow[place]ak Weeks in Treatment: 0 Diagnosis Coding ICD-10 Codes Code Description I87.2 Venous insufficiency (chronic) (peripheral) L97.812 Non-pressure chronic ulcer of other part of right lower leg with fat layer exposed I10 Essential (primary) hypertension I50.42 Chronic combined systolic (congestive) and diastolic (congestive) heart failure I48.0 Paroxysmal atrial fibrillation Facility Procedures CPT4 Code: 28413244 Description: 99213 - WOUND CARE VISIT-LEV 3 EST PT Modifier: Quantity: 1 CPT4 Code: 01027253 Description: (Facility Use Only) 66440HK - APPLY UNNA BOOT RT Modifier: Quantity: 1 Physician Procedures CPT4 Code: 7425956 Description: 99213 - WC PHYS LEVEL 3 - EST PT Modifier: Quantity: 1 CPT4 Code: Description: ICD-10 Diagnosis Description I87.2 Venous  insufficiency (chronic) (peripheral) L97.812 Non-pressure chronic ulcer of other part of right lower leg with fat la I10 Essential (primary) hypertension I50.42 Chronic combined systolic (congestive)  and diastolic (congestive) heart Modifier: yer exposed failure Quantity: Electronic Signature(s) Signed: 11/29/2019 10:29:19 AM By: Lenda Kelp PA-C Entered By: Lenda Kelp on 11/29/2019 10:29:18

## 2019-12-04 NOTE — Progress Notes (Addendum)
Carlos Roach, Carlos Roach ([id number]) Visit Report for 11/29/2019 Allergy List Details Patient Name: Carlos Roach, Carlos Roach Date of Service: 11/29/2019 9:15 AM Medical Record Number: [id number] Patient Account Number: [id number] Date of Birth/Sex: 07-Aug-1940 (78 y.o. M) Treating RN: Huel Coventry Primary Care Damoney Julia: Eddie Candle Other Clinician: Referring Mairlyn Tegtmeyer: Eddie Candle Treating Meria Crilly/Extender: Linwood Dibbles, HOYT Weeks in Treatment: 0 Allergies Active Allergies Mucinex Allergy Notes Electronic Signature(s) Signed: 12/03/2019 7:43:28 PM By: Elliot Gurney, BSN, RN, CWS, Kim RN, BSN Entered By: Elliot Gurney, BSN, RN, CWS, Kim on 11/29/2019 10:00:41 Carlos Roach ([id number]) -------------------------------------------------------------------------------- Arrival Information Details Patient Name: Carlos Roach Date of Service: 11/29/2019 9:15 AM Medical Record Number: [id number] Patient Account Number: [id number] Date of Birth/Sex: 07/24/1940 (78 y.o. M) Treating RN: Huel Coventry Primary Care Audrina Marten: Eddie Candle Other Clinician: Referring Kenyata Guess: Eddie Candle Treating Takesha Steger/Extender: Skeet Simmer in Treatment: 0 Visit Information Patient Arrived: Walker Arrival Time: 09:58 Accompanied By: wife Transfer Assistance: None Patient Identification Verified: Yes Secondary Verification Process Completed: Yes History Since Last Visit Electronic Signature(s) Signed: 12/03/2019 7:43:28 PM By: Elliot Gurney, BSN, RN, CWS, Kim RN, BSN Entered By: Elliot Gurney, BSN, RN, CWS, Kim on 11/29/2019 09:58:34 Carlos Roach ([id number]) -------------------------------------------------------------------------------- Clinic Level of Care Assessment Details Patient Name: Carlos Roach Date of Service: 11/29/2019 9:15 AM Medical Record Number: [id number] Patient Account Number: [id number] Date of Birth/Sex: 11/16/1940 (78 y.o. M) Treating RN: Huel Coventry Primary Care Darald Uzzle: Eddie Candle Other  Clinician: Referring Keylan Costabile: Eddie Candle Treating Cardelia Sassano/Extender: Linwood Dibbles, HOYT Weeks in Treatment: 0 Clinic Level of Care Assessment Items TOOL 1 Quantity Score []  - Use when EandM and Procedure is performed on INITIAL visit 0 ASSESSMENTS - Nursing Assessment / Reassessment X - General Physical Exam (combine w/ comprehensive assessment (listed just below) when performed on new 1 20 pt. evals) X- 1 25 Comprehensive Assessment (HX, ROS, Risk Assessments, Wounds Hx, etc.) ASSESSMENTS - Wound and Skin Assessment / Reassessment []  - Dermatologic / Skin Assessment (not related to wound area) 0 ASSESSMENTS - Ostomy and/or Continence Assessment and Care []  - Incontinence Assessment and Management 0 []  - 0 Ostomy Care Assessment and Management (repouching, etc.) PROCESS - Coordination of Care X - Simple Patient / Family Education for ongoing care 1 15 []  - 0 Complex (extensive) Patient / Family Education for ongoing care []  - 0 Staff obtains , Records, Test Results / Process Orders []  - 0 Staff telephones HHA, Nursing Homes / Clarify orders / etc []  - 0 Routine Transfer to another Facility (non-emergent condition) []  - 0 Routine Hospital Admission (non-emergent condition) X- 1 15 New Admissions / / Ordering NPWT, Apligraf, etc. []  - 0 Emergency Hospital Admission (emergent condition) PROCESS - Special Needs []  - Pediatric / Minor Patient Management 0 []  - 0 Isolation Patient Management []  - 0 Hearing / Language / Visual special needs []  - 0 Assessment of Community assistance (transportation, D/C planning, etc.) []  - 0 Additional assistance / Altered mentation []  - 0 Support Surface(s) Assessment (bed, cushion, seat, etc.) INTERVENTIONS - Miscellaneous []  - External ear exam 0 []  - 0 Patient Transfer (multiple staff / / Similar devices) []  - 0 Simple Staple / Suture removal (25 or less) []  - 0 Complex Staple / Suture  removal (26 or more) []  - 0 Hypo/Hyperglycemic Management (do not check if billed separately) X- 1 15 Ankle / Brachial Index (ABI) - do not check if billed separately Has the patient been seen at the hospital within the last three years: Yes Total Score: 90  Level Of Care: New/Established - Level 3 Carlos Roach, Carlos Roach ([id number]) Electronic Signature(s) Signed: 12/03/2019 7:43:28 PM By: Elliot Gurney, BSN, RN, CWS, Kim RN, BSN Entered By: Elliot Gurney, BSN, RN, CWS, Kim on 11/29/2019 10:24:25 Carlos Roach ([id number]) -------------------------------------------------------------------------------- Compression Therapy Details Patient Name: Carlos Roach Date of Service: 11/29/2019 9:15 AM Medical Record Number: [id number] Patient Account Number: [id number] Date of Birth/Sex: 05/13/1940 (78 y.o. M) Treating RN: Huel Coventry Primary Care Fritzi Scripter: Eddie Candle Other Clinician: Referring Monique Hefty: Eddie Candle Treating Eman Rynders/Extender: Linwood Dibbles, HOYT Weeks in Treatment: 0 Compression Therapy Performed for Wound Assessment: Wound #9 Right,Lateral Lower Leg Performed By: Clinician Huel Coventry, RN Compression Type: Henriette Combs Pre Treatment ABI: 1.3 Post Procedure Diagnosis Same as Pre-procedure Electronic Signature(s) Signed: 12/03/2019 7:43:28 PM By: Elliot Gurney, BSN, RN, CWS, Kim RN, BSN Entered By: Elliot Gurney, BSN, RN, CWS, Kim on 11/29/2019 10:17:17 Carlos Roach ([id number]) -------------------------------------------------------------------------------- Encounter Discharge Information Details Patient Name: Carlos Roach Date of Service: 11/29/2019 9:15 AM Medical Record Number: [id number] Patient Account Number: [id number] Date of Birth/Sex: 11-25-1940 (78 y.o. M) Treating RN: Huel Coventry Primary Care Mateo Overbeck: Eddie Candle Other Clinician: Referring Omri Bertran: Eddie Candle Treating Adamarys Shall/Extender: Linwood Dibbles, HOYT Weeks in Treatment: 0 Encounter Discharge Information Items Discharge  Condition: Stable Ambulatory Status: Walker Discharge Destination: Home Transportation: Private Auto Accompanied By: wife Schedule Follow-up Appointment: Yes Clinical Summary of Care: Electronic Signature(s) Signed: 12/03/2019 7:43:28 PM By: Elliot Gurney, BSN, RN, CWS, Kim RN, BSN Entered By: Elliot Gurney, BSN, RN, CWS, Kim on 11/29/2019 10:26:26 Carlos Roach ([id number]) -------------------------------------------------------------------------------- Lower Extremity Assessment Details Patient Name: Carlos Roach Date of Service: 11/29/2019 9:15 AM Medical Record Number: [id number] Patient Account Number: [id number] Date of Birth/Sex: 12/24/1940 (78 y.o. M) Treating RN: Huel Coventry Primary Care Aneka Fagerstrom: Eddie Candle Other Clinician: Referring Ellouise Mcwhirter: Eddie Candle Treating Shaya Reddick/Extender: Linwood Dibbles, HOYT Weeks in Treatment: 0 Edema Assessment Assessed: [Left: No] [Right: No] [Left: Edema] [Right: :] Calf Left: Right: Point of Measurement: 33 cm From Medial Instep 35 cm 35.5 cm Ankle Left: Right: Point of Measurement: 15 cm From Medial Instep 25 cm 26 cm Vascular Assessment Pulses: Dorsalis Pedis Palpable: [Left:Yes] [Right:Yes] Posterior Tibial Palpable: [Left:Yes] [Right:Yes] Blood Pressure: Brachial: [Left:100] [Right:90] Ankle: [Left:Dorsalis Pedis: 70 0.70] [Right:Dorsalis Pedis: 130 1.30] Electronic Signature(s) Signed: 12/03/2019 7:43:28 PM By: Elliot Gurney, BSN, RN, CWS, Kim RN, BSN Entered By: Elliot Gurney, BSN, RN, CWS, Kim on 11/29/2019 10:05:10 Carlos Roach ([id number]) -------------------------------------------------------------------------------- Multi Wound Chart Details Patient Name: Carlos Roach Date of Service: 11/29/2019 9:15 AM Medical Record Number: [id number] Patient Account Number: [id number] Date of Birth/Sex: 05/05/1940 (78 y.o. 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] [N/A:N/A] Date Acquired: [9:11/04/2019] [N/A:N/A] Weeks of Treatment: [9:0] [N/A:N/A] Wound Status: [9:Open] [N/A:N/A] Measurements L x W x D (cm) [9:0.3x0.2x0.1] [N/A:N/A] Area (cm) : [9:0.047] [N/A:N/A] Volume (cm) : [9:0.005] [N/A:N/A] Classification: [9:Partial Thickness] [N/A:N/A] Exudate Amount: [9:Medium] [N/A:N/A] Exudate Type: [9:Serous] [N/A:N/A] Exudate Color: [9:amber] [N/A:N/A] Wound Margin: [9:Indistinct, nonvisible] [N/A:N/A] Granulation Amount: [9:None Present (0%)] [N/A:N/A] Necrotic Amount: [9:None Present (0%)] [N/A:N/A] Exposed Structures: [9:Fascia: No Fat Layer (Subcutaneous Tissue): No Tendon: No Muscle: No Joint: No Bone: No Limited to Skin Breakdown None] [N/A:N/A N/A] Treatment Notes Electronic Signature(s) Signed: 12/03/2019 7:43:28 PM By:  Elliot GurneyWoody, BSN, RN, CWS, Kim RN, BSN Entered By: Elliot GurneyWoody, BSN, RN, CWS, Kim on 11/29/2019 10:16:04 Carlos IrishJENKINS, Carlos Roach ([id number]) -------------------------------------------------------------------------------- Multi-Disciplinary Care Plan Details Patient Name: Carlos IrishJENKINS, Carlos Roach Date of Service: 11/29/2019 9:15 AM Medical Record Number: [id number] Patient Account Number: [id number] Date of Birth/Sex: July 02, 1940 (78 y.o. M) Treating RN: Huel CoventryWoody, Kim Primary Care Massiel Stipp: Eddie CandleNELSON, SARAH Other Clinician: Referring Leilan Bochenek: Eddie CandleNELSON, SARAH Treating Seiji Wiswell/Extender: Skeet SimmerSTONE III, HOYT Weeks in Treatment: 0 Active Inactive Electronic Signature(s) Signed: 01/07/2020 9:39:21 AM By: Elliot GurneyWoody, BSN, RN, CWS, Kim RN, BSN Previous Signature: 12/03/2019  7:43:28 PM Version By: Elliot GurneyWoody, BSN, RN, CWS, Kim RN, BSN Entered By: Elliot GurneyWoody, BSN, RN, CWS, Kim on 01/07/2020 09:39:21 Carlos IrishJENKINS, Carlos Roach ([id number]) -------------------------------------------------------------------------------- Pain Assessment Details Patient Name: Carlos IrishJENKINS, Carlos Roach Date of Service: 11/29/2019 9:15 AM Medical Record Number: [id number] Patient Account Number: [id number] Date of Birth/Sex: July 02, 1940 (78 y.o. M) Treating RN: Huel CoventryWoody, Kim Primary Care Ruby Dilone: Eddie CandleNELSON, SARAH Other Clinician: Referring Denea Cheaney: Eddie CandleNELSON, SARAH Treating Teagen Mcleary/Extender: Linwood DibblesSTONE III, HOYT Weeks in Treatment: 0 Active Problems Location of Pain Severity and Description of Pain Patient Has Paino No Site Locations Pain Management and Medication Current Pain Management: Electronic Signature(s) Signed: 12/03/2019 7:43:28 PM By: Elliot GurneyWoody, BSN, RN, CWS, Kim RN, BSN Entered By: Elliot GurneyWoody, BSN, RN, CWS, Kim on 11/29/2019 09:58:41 Carlos IrishJENKINS, Carlos Roach ([id number]) -------------------------------------------------------------------------------- Patient/Caregiver Education Details Patient Name: Carlos IrishJENKINS, Carlos Roach Date of Service: 11/29/2019 9:15 AM Medical Record Number: [id number] Patient Account Number: [id number] Date of Birth/Gender: July 02, 1940 (78 y.o. M) Treating RN: Huel CoventryWoody, Kim Primary Care Physician: Eddie CandleNELSON, SARAH Other Clinician: Referring Physician: Eddie CandleNELSON, SARAH Treating Physician/Extender: Skeet SimmerSTONE III, HOYT Weeks in Treatment: 0 Education Assessment Education Provided To: Patient Education Topics Provided Wound/Skin Impairment: Handouts: Caring for Your Ulcer Methods: Demonstration, Explain/Verbal Responses: State content correctly Electronic Signature(s) Signed: 12/03/2019 7:43:28 PM By: Elliot GurneyWoody, BSN, RN, CWS, Kim RN, BSN Entered By: Elliot GurneyWoody, BSN, RN, CWS, Kim on 11/29/2019 10:25:01 Carlos IrishJENKINS, Carlos Roach ([id number]) -------------------------------------------------------------------------------- Wound Assessment  Details Patient Name: Carlos IrishJENKINS, Carlos Roach Date of Service: 11/29/2019 9:15 AM Medical Record Number: [id number] Patient Account Number: [id number] Date of Birth/Sex: July 02, 1940 (78 y.o. M) Treating RN: Huel CoventryWoody, Kim Primary Care Shriley Joffe: Eddie CandleNELSON, SARAH Other Clinician: Referring Kady Toothaker: Eddie CandleNELSON, SARAH Treating Praneeth Bussey/Extender: Linwood DibblesSTONE III, HOYT Weeks in Treatment: 0 Wound Status Wound Number: [id number] Primary Etiology: Venous Leg Ulcer Wound Location: Right, Lateral Lower Leg Wound Status: Open Wounding Event: Blister Date Acquired: 11/04/2019 Weeks Of Treatment: 0 Clustered Wound: No Wound Measurements Length: (cm) 0.3 Width: (cm) 0.2 Depth: (cm) 0.1 Area: (cm) 0.047 Volume: (cm) 0.005 % Reduction in Area: % Reduction in Volume: Epithelialization: None Tunneling: No Undermining: No Wound Description Classification: Partial Thickness Wound Margin: Indistinct, nonvisible Exudate Amount: Medium Exudate Type: Serous Exudate Color: amber Foul Odor After Cleansing: No Slough/Fibrino No Wound Bed Granulation Amount: None Present (0%) Exposed Structure Necrotic Amount: None Present (0%) Fascia Exposed: No Fat Layer (Subcutaneous Tissue) Exposed: No Tendon Exposed: No Muscle Exposed: No Joint Exposed: No Bone Exposed: No Limited to Skin Breakdown Electronic Signature(s) Signed: 12/03/2019 7:43:28 PM By: Elliot GurneyWoody, BSN, RN, CWS, Kim RN, BSN Entered By: Elliot GurneyWoody, BSN, RN, CWS, Kim on 11/29/2019 10:00:32 Carlos IrishJENKINS, Isaac ([id number]) -------------------------------------------------------------------------------- Vitals Details Patient Name: Carlos IrishJENKINS, Tiger Date of Service: 11/29/2019 9:15 AM Medical Record Number: [id number] Patient Account Number: [id number] Date of Birth/Sex: July 02, 1940 (78 y.o. M) Treating RN: Huel CoventryWoody, Kim Primary Care Steel Kerney: Eddie CandleNELSON, SARAH Other Clinician: Referring Arianny Pun: Eddie CandleNELSON, SARAH Treating Leinaala Catanese/Extender: Linwood DibblesSTONE III, HOYT Weeks in Treatment: 0 Vital  Signs Time Taken: 09:58 Temperature (F): 98.1 Height (in): 72 Pulse (bpm): 93 Weight (  lbs): 251 Respiratory Rate (breaths/min): 20 Body Mass Index (BMI): 34 Blood Pressure (mmHg): 119/59 Reference Range: 80 - 120 mg / dl Electronic Signature(s) Signed: 12/03/2019 7:43:28 PM By: Elliot Gurney, BSN, RN, CWS, Kim RN, BSN Entered By: Elliot Gurney, BSN, RN, CWS, Kim on 11/29/2019 09:59:10

## 2019-12-27 ENCOUNTER — Inpatient Hospital Stay
Admission: EM | Admit: 2019-12-27 | Discharge: 2020-01-02 | DRG: 522 | Disposition: A | Discharge status: Skilled Nursing Facility | Payer: Medicare PPO | Attending: Internal Medicine | Admitting: Internal Medicine

## 2019-12-27 ENCOUNTER — Emergency Department: Payer: Medicare PPO

## 2019-12-27 ENCOUNTER — Other Ambulatory Visit: Payer: Self-pay

## 2019-12-27 ENCOUNTER — Encounter: Payer: Self-pay | Admitting: Emergency Medicine

## 2019-12-27 ENCOUNTER — Ambulatory Visit: Payer: Medicare PPO | Admitting: Physician Assistant

## 2019-12-27 DIAGNOSIS — I482 Chronic atrial fibrillation, unspecified: Secondary | ICD-10-CM

## 2019-12-27 DIAGNOSIS — I4811 Longstanding persistent atrial fibrillation: Secondary | ICD-10-CM | POA: Diagnosis present

## 2019-12-27 DIAGNOSIS — Z8249 Family history of ischemic heart disease and other diseases of the circulatory system: Secondary | ICD-10-CM | POA: Diagnosis not present

## 2019-12-27 DIAGNOSIS — S72001A Fracture of unspecified part of neck of right femur, initial encounter for closed fracture: Secondary | ICD-10-CM | POA: Diagnosis present

## 2019-12-27 DIAGNOSIS — I110 Hypertensive heart disease with heart failure: Secondary | ICD-10-CM | POA: Diagnosis present

## 2019-12-27 DIAGNOSIS — I10 Essential (primary) hypertension: Secondary | ICD-10-CM | POA: Diagnosis present

## 2019-12-27 DIAGNOSIS — Z79899 Other long term (current) drug therapy: Secondary | ICD-10-CM

## 2019-12-27 DIAGNOSIS — W19XXXD Unspecified fall, subsequent encounter: Secondary | ICD-10-CM

## 2019-12-27 DIAGNOSIS — I83009 Varicose veins of unspecified lower extremity with ulcer of unspecified site: Secondary | ICD-10-CM | POA: Diagnosis present

## 2019-12-27 DIAGNOSIS — F1010 Alcohol abuse, uncomplicated: Secondary | ICD-10-CM | POA: Diagnosis present

## 2019-12-27 DIAGNOSIS — S72011A Unspecified intracapsular fracture of right femur, initial encounter for closed fracture: Principal | ICD-10-CM | POA: Diagnosis present

## 2019-12-27 DIAGNOSIS — Z66 Do not resuscitate: Secondary | ICD-10-CM | POA: Diagnosis present

## 2019-12-27 DIAGNOSIS — L97819 Non-pressure chronic ulcer of other part of right lower leg with unspecified severity: Secondary | ICD-10-CM | POA: Diagnosis present

## 2019-12-27 DIAGNOSIS — Z23 Encounter for immunization: Secondary | ICD-10-CM | POA: Diagnosis present

## 2019-12-27 DIAGNOSIS — M7989 Other specified soft tissue disorders: Secondary | ICD-10-CM

## 2019-12-27 DIAGNOSIS — F1090 Alcohol use, unspecified, uncomplicated: Secondary | ICD-10-CM

## 2019-12-27 DIAGNOSIS — Z789 Other specified health status: Secondary | ICD-10-CM

## 2019-12-27 DIAGNOSIS — L97829 Non-pressure chronic ulcer of other part of left lower leg with unspecified severity: Secondary | ICD-10-CM | POA: Diagnosis present

## 2019-12-27 DIAGNOSIS — Z7289 Other problems related to lifestyle: Secondary | ICD-10-CM | POA: Diagnosis not present

## 2019-12-27 DIAGNOSIS — W010XXA Fall on same level from slipping, tripping and stumbling without subsequent striking against object, initial encounter: Secondary | ICD-10-CM | POA: Diagnosis present

## 2019-12-27 DIAGNOSIS — Y93K1 Activity, walking an animal: Secondary | ICD-10-CM

## 2019-12-27 DIAGNOSIS — D638 Anemia in other chronic diseases classified elsewhere: Secondary | ICD-10-CM | POA: Diagnosis present

## 2019-12-27 DIAGNOSIS — Y92009 Unspecified place in unspecified non-institutional (private) residence as the place of occurrence of the external cause: Secondary | ICD-10-CM | POA: Diagnosis not present

## 2019-12-27 DIAGNOSIS — Z20822 Contact with and (suspected) exposure to covid-19: Secondary | ICD-10-CM | POA: Diagnosis present

## 2019-12-27 DIAGNOSIS — Z7901 Long term (current) use of anticoagulants: Secondary | ICD-10-CM | POA: Diagnosis not present

## 2019-12-27 DIAGNOSIS — G4733 Obstructive sleep apnea (adult) (pediatric): Secondary | ICD-10-CM | POA: Diagnosis present

## 2019-12-27 DIAGNOSIS — Z96649 Presence of unspecified artificial hip joint: Secondary | ICD-10-CM

## 2019-12-27 DIAGNOSIS — I5022 Chronic systolic (congestive) heart failure: Secondary | ICD-10-CM | POA: Diagnosis present

## 2019-12-27 DIAGNOSIS — L97909 Non-pressure chronic ulcer of unspecified part of unspecified lower leg with unspecified severity: Secondary | ICD-10-CM | POA: Diagnosis not present

## 2019-12-27 DIAGNOSIS — W19XXXA Unspecified fall, initial encounter: Secondary | ICD-10-CM | POA: Diagnosis present

## 2019-12-27 DIAGNOSIS — N400 Enlarged prostate without lower urinary tract symptoms: Secondary | ICD-10-CM | POA: Diagnosis present

## 2019-12-27 DIAGNOSIS — I4891 Unspecified atrial fibrillation: Secondary | ICD-10-CM | POA: Diagnosis present

## 2019-12-27 LAB — COMPREHENSIVE METABOLIC PANEL
ALT: 24 U/L (ref 0–44)
AST: 29 U/L (ref 15–41)
Albumin: 3.4 g/dL — ABNORMAL LOW (ref 3.5–5.0)
Alkaline Phosphatase: 96 U/L (ref 38–126)
Anion gap: 11 (ref 5–15)
BUN: 29 mg/dL — ABNORMAL HIGH (ref 8–23)
CO2: 29 mmol/L (ref 22–32)
Calcium: 9.4 mg/dL (ref 8.9–10.3)
Chloride: 96 mmol/L — ABNORMAL LOW (ref 98–111)
Creatinine, Ser: 0.86 mg/dL (ref 0.61–1.24)
GFR calc Af Amer: >60 mL/min (ref >60)
GFR calc non Af Amer: >60 mL/min (ref >60)
Glucose, Bld: 117 mg/dL — ABNORMAL HIGH (ref 70–99)
Potassium: 3.6 mmol/L (ref 3.5–5.1)
Sodium: 136 mmol/L (ref 135–145)
Total Bilirubin: 1.9 mg/dL — ABNORMAL HIGH (ref 0.3–1.2)
Total Protein: 8.2 g/dL — ABNORMAL HIGH (ref 6.5–8.1)

## 2019-12-27 LAB — CBC WITH DIFFERENTIAL/PLATELET
Abs Immature Granulocytes: 0.05 10*3/uL (ref 0.00–0.07)
Basophils Absolute: 0.1 10*3/uL (ref 0.0–0.1)
Basophils Relative: 1 %
Eosinophils Absolute: 0.2 10*3/uL (ref 0.0–0.5)
Eosinophils Relative: 2 %
HCT: 40 % (ref 39.0–52.0)
Hemoglobin: 13.7 g/dL (ref 13.0–17.0)
Immature Granulocytes: 1 %
Lymphocytes Relative: 10 %
Lymphs Abs: 0.9 10*3/uL (ref 0.7–4.0)
MCH: 29.2 pg (ref 26.0–34.0)
MCHC: 34.3 g/dL (ref 30.0–36.0)
MCV: 85.3 fL (ref 80.0–100.0)
Monocytes Absolute: 0.9 10*3/uL (ref 0.1–1.0)
Monocytes Relative: 10 %
Neutro Abs: 6.6 10*3/uL (ref 1.7–7.7)
Neutrophils Relative %: 76 %
Platelets: 241 10*3/uL (ref 150–400)
RBC: 4.69 MIL/uL (ref 4.22–5.81)
RDW: 14.6 % (ref 11.5–15.5)
WBC: 8.7 10*3/uL (ref 4.0–10.5)
nRBC: 0 % (ref 0.0–0.2)

## 2019-12-27 LAB — TYPE AND SCREEN
ABO/RH(D): O POS
Antibody Screen: NEGATIVE

## 2019-12-27 LAB — PROTIME-INR
INR: 1.3 — ABNORMAL HIGH (ref 0.8–1.2)
Prothrombin Time: 16.1 seconds — ABNORMAL HIGH (ref 11.4–15.2)

## 2019-12-27 LAB — SARS CORONAVIRUS 2 BY RT PCR (HOSPITAL ORDER, PERFORMED IN ~~LOC~~ HOSPITAL LAB): SARS Coronavirus 2: NEGATIVE

## 2019-12-27 LAB — APTT: aPTT: 45 seconds — ABNORMAL HIGH (ref 24–36)

## 2019-12-27 MED ORDER — METHOCARBAMOL 500 MG PO TABS
500.0000 mg | ORAL_TABLET | Freq: Four times a day (QID) | ORAL | Status: DC | PRN
  Administered 2019-12-28: 500 mg via ORAL
  Filled 2019-12-27 (×2): qty 1

## 2019-12-27 MED ORDER — OMEGA-3-ACID ETHYL ESTERS 1 G PO CAPS
1.0000 g | ORAL_CAPSULE | Freq: Two times a day (BID) | ORAL | Status: DC
  Administered 2019-12-27 – 2020-01-02 (×11): 1 g via ORAL
  Filled 2019-12-27 (×12): qty 1

## 2019-12-27 MED ORDER — TORSEMIDE 20 MG PO TABS
20.0000 mg | ORAL_TABLET | Freq: Two times a day (BID) | ORAL | Status: DC
  Administered 2019-12-27 – 2020-01-01 (×8): 20 mg via ORAL
  Filled 2019-12-27 (×9): qty 1

## 2019-12-27 MED ORDER — HYDROCODONE-ACETAMINOPHEN 5-325 MG PO TABS
1.0000 | ORAL_TABLET | Freq: Four times a day (QID) | ORAL | Status: DC | PRN
  Administered 2019-12-27 – 2019-12-28 (×4): 2 via ORAL
  Filled 2019-12-27 (×4): qty 2

## 2019-12-27 MED ORDER — METOPROLOL SUCCINATE ER 50 MG PO TB24
50.0000 mg | ORAL_TABLET | Freq: Every day | ORAL | Status: DC
  Administered 2019-12-28 – 2020-01-02 (×6): 50 mg via ORAL
  Filled 2019-12-27 (×6): qty 1

## 2019-12-27 MED ORDER — POLYETHYLENE GLYCOL 3350 17 G PO PACK: 17.0000 g | PACK | Freq: Every day | ORAL | Status: DC | PRN

## 2019-12-27 MED ORDER — MORPHINE SULFATE (PF) 4 MG/ML IV SOLN
4.0000 mg | Freq: Once | INTRAVENOUS | Status: AC
  Administered 2019-12-27: 4 mg via INTRAVENOUS
  Filled 2019-12-27: qty 1

## 2019-12-27 MED ORDER — CEFAZOLIN SODIUM-DEXTROSE 2-4 GM/100ML-% IV SOLN
2.0000 g | INTRAVENOUS | Status: AC
  Administered 2019-12-29: 2 g via INTRAVENOUS
  Filled 2019-12-27 (×2): qty 100

## 2019-12-27 MED ORDER — ADULT MULTIVITAMIN W/MINERALS CH
1.0000 | ORAL_TABLET | Freq: Every day | ORAL | Status: DC
  Administered 2019-12-28 – 2020-01-01 (×4): 1 via ORAL
  Filled 2019-12-27 (×5): qty 1

## 2019-12-27 MED ORDER — MELATONIN 5 MG PO TABS
2.5000 mg | ORAL_TABLET | Freq: Every day | ORAL | Status: DC
  Administered 2019-12-27 – 2020-01-01 (×6): 2.5 mg via ORAL
  Filled 2019-12-27 (×4): qty 1
  Filled 2019-12-27: qty 0.5
  Filled 2019-12-27: qty 1

## 2019-12-27 MED ORDER — TAMSULOSIN HCL 0.4 MG PO CAPS
0.4000 mg | ORAL_CAPSULE | Freq: Every day | ORAL | Status: DC
  Administered 2019-12-28 – 2020-01-02 (×5): 0.4 mg via ORAL
  Filled 2019-12-27 (×5): qty 1

## 2019-12-27 MED ORDER — POTASSIUM CHLORIDE CRYS ER 20 MEQ PO TBCR
20.0000 meq | EXTENDED_RELEASE_TABLET | Freq: Two times a day (BID) | ORAL | Status: DC
  Administered 2019-12-27 – 2020-01-02 (×11): 20 meq via ORAL
  Filled 2019-12-27 (×11): qty 1

## 2019-12-27 MED ORDER — FERROUS SULFATE 325 (65 FE) MG PO TABS
325.0000 mg | ORAL_TABLET | Freq: Two times a day (BID) | ORAL | Status: DC
  Administered 2019-12-28 – 2020-01-02 (×9): 325 mg via ORAL
  Filled 2019-12-27 (×10): qty 1

## 2019-12-27 MED ORDER — SIMVASTATIN 20 MG PO TABS
40.0000 mg | ORAL_TABLET | Freq: Every day | ORAL | Status: DC
  Administered 2019-12-28 – 2020-01-02 (×5): 40 mg via ORAL
  Filled 2019-12-27 (×5): qty 2

## 2019-12-27 MED ORDER — ONDANSETRON HCL 4 MG/2ML IJ SOLN
4.0000 mg | Freq: Once | INTRAMUSCULAR | Status: AC
  Administered 2019-12-27: 4 mg via INTRAVENOUS
  Filled 2019-12-27: qty 2

## 2019-12-27 MED ORDER — MORPHINE SULFATE (PF) 2 MG/ML IV SOLN: 0.5000 mg | INTRAVENOUS | Status: DC | PRN

## 2019-12-27 MED ORDER — METHOCARBAMOL 1000 MG/10ML IJ SOLN
500.0000 mg | Freq: Four times a day (QID) | INTRAVENOUS | Status: DC | PRN
  Filled 2019-12-27: qty 5

## 2019-12-27 MED ORDER — THIAMINE HCL 100 MG PO TABS
100.0000 mg | ORAL_TABLET | Freq: Every day | ORAL | Status: DC
  Administered 2019-12-28 – 2020-01-02 (×5): 100 mg via ORAL
  Filled 2019-12-27 (×6): qty 1

## 2019-12-27 MED ORDER — DILTIAZEM HCL ER COATED BEADS 120 MG PO CP24
120.0000 mg | ORAL_CAPSULE | Freq: Every day | ORAL | Status: DC
  Administered 2019-12-27 – 2019-12-30 (×4): 120 mg via ORAL
  Filled 2019-12-27 (×8): qty 1

## 2019-12-27 NOTE — ED Triage Notes (Signed)
Fell last Saturday while walking the dog.  Fell onto right shoulder, hip.  Went to [facility] today and foundout he has right hip fracture.  Has not been able to walk on it.

## 2019-12-27 NOTE — ED Notes (Signed)
Patient to ED via POV from [facility] for right femoral neck fracture intertrochanteric that occurred on 09/11. Patient alert and oriented on arrival. Arrives with son.

## 2019-12-27 NOTE — Consult Note (Signed)
ORTHOPAEDIC CONSULTATION  REQUESTING PHYSICIAN: Lucile Shutters, MD  Chief Complaint:   Right hip pain.  History of Present Illness: Carlos Roach is a 79 y.o. male with a history of hypertension, obstructive sleep apnea, chronic atrial fibrillation requiring anticoagulation, cellulitis, chronic venous stasis of both lower extremities, alcohol use, and congestive heart failure who lives independently with his wife.  The patient tends not to use any assistive devices when walking around the house but will use a cane or walker when out of the house.  Apparently, the patient was in his usual state of health 1 week ago when he decided to take his son's dog for a walk with his walker.  As he got outside, the dog bolted, causing him to fall onto his right side.  Initially, he did not seek care but tried to see if he would get better.  He initially thought his hip was feeling better for the first several days after the injury, but his symptoms worsened over the past 24 hours, prompting him to get evaluated at a local urgent care facility.  X-rays there demonstrated a displaced right femoral neck fracture, so the patient was advised to go to the emergency room.  The patient denies any associated injuries stemming from the fall.  He did not strike his head or lose consciousness.  The patient also denies any lightheadedness, dizziness, chest pain, shortness of breath, or other symptoms which may have precipitated his fall.  Past Medical History:  Diagnosis Date  . Alcohol use   . Arrhythmia    atrial fibrillation  . Cellulitis   . Chronic atrial fibrillation (HCC)    a. CHADS2VASc 5 (CHF, HTN, age x 2, vascular disease)  . Chronic systolic CHF (congestive heart failure) (HCC)    a. TTE 1/19: EF 50%, mild LVH, trivial MR/PR/TR, small pericardial effusion  . Chronic venous stasis   . GI bleed   . Hypertension   . Obstructive sleep apnea     Past Surgical History:  Procedure Laterality Date  . ABDOMINAL SURGERY     Social History   Socioeconomic History  . Marital status: Married    Spouse name: Not on file  . Number of children: Not on file  . Years of education: Not on file  . Highest education level: Not on file  Occupational History  . Not on file  Tobacco Use  . Smoking status: Never Smoker  . Smokeless tobacco: Never Used  Vaping Use  . Vaping Use: Never used  Substance and Sexual Activity  . Alcohol use: Not Currently    Comment: no etoh for 3-4 months ago  . Drug use: No  . Sexual activity: Not on file  Other Topics Concern  . Not on file  Social History Narrative  . Not on file   Social Determinants of Health   Financial Resource Strain:   . Difficulty of Paying Living Expenses: Not on file  Food Insecurity:   . Worried About Programme researcher, broadcasting/film/video in the Last Year: Not on file  . Ran Out of Food in the Last Year: Not on file  Transportation Needs:   . Lack of Transportation (Medical): Not on file  . Lack of Transportation (Non-Medical): Not on file  Physical Activity:   . Days of Exercise per Week: Not on file  . Minutes of Exercise per Session: Not on file  Stress:   . Feeling of Stress : Not on file  Social Connections:   . Frequency  of Communication with Friends and Family: Not on file  . Frequency of Social Gatherings with Friends and Family: Not on file  . Attends Religious Services: Not on file  . Active Member of Clubs or Organizations: Not on file  . Attends Banker Meetings: Not on file  . Marital Status: Not on file   Family History  Problem Relation Age of Onset  . Hypertension Father    Allergies  Allergen Reactions  . Mucinex [Guaifenesin Er] Rash   Prior to Admission medications   Medication Sig Start Date End Date Taking? Authorizing Provider  diltiazem (CARDIZEM CD) 120 MG 24 hr capsule Take 1 capsule (120 mg total) by mouth daily. 06/11/17  Yes Salary,  Evelena Asa, MD  ELIQUIS 5 MG TABS tablet Take 5 mg by mouth 2 (two) times daily.    Yes [provider]  FEROSUL 325 (65 Fe) MG tablet Take 325 mg by mouth daily.    Yes [provider]  folic acid (FOLVITE) 400 MCG tablet Take 400 mcg by mouth daily.    Yes [provider]  metoprolol succinate (TOPROL-XL) 50 MG 24 hr tablet Take 50 mg by mouth every evening.    Yes [provider]  Multiple Vitamin (MULTIVITAMIN) tablet Take 1 tablet by mouth daily.   Yes [provider]  Omega-3 Fatty Acids (FISH OIL) 1000 MG CAPS Take 1,000-2,000 mg by mouth See admin instructions. Take 2 capsules (2000mg ) by mouth every morning and and take 1 capsule (1000mg ) by mouth every night   Yes [provider]  potassium chloride SA (K-DUR,KLOR-CON) 20 MEQ tablet Take 40 mEq by mouth daily.    Yes [provider]  simvastatin (ZOCOR) 40 MG tablet Take 40 mg by mouth at bedtime.    Yes [provider]  torsemide (DEMADEX) 20 MG tablet Take 60 mg by mouth daily.   Yes [provider]  vitamin B-12 (CYANOCOBALAMIN) 500 MCG tablet Take 500 mcg by mouth daily.   Yes [provider]   DG Chest 1 View  Result Date: 12/27/2019 CLINICAL DATA:  Right hip fracture. EXAM: CHEST  1 VIEW COMPARISON:  December 13, 2018 FINDINGS: Opacity in left retrocardiac region is unchanged since December 13, 2018. There is suggestion of a small left pleural effusion versus pleural thickening. Stable cardiomegaly. No other changes. IMPRESSION: Persistent opacity and small effusion in the left base, unchanged since December 13, 2018. No acute interval changes. Electronically Signed   By: December 15, 2018 III M.D   On: 12/27/2019 17:24   DG Hip Unilat With Pelvis 2-3 Views Right  Result Date: 12/27/2019 CLINICAL DATA:  Fall 6 days ago. EXAM: DG HIP (WITH OR WITHOUT PELVIS) 2-3V RIGHT COMPARISON:  None FINDINGS: There is a comminuted displaced subcapital right hip  fracture. No dislocation of the femoral head is noted. The femoral head appears to be rotated within the acetabulum. No other bony abnormalities are noted. IMPRESSION: Comminuted displaced subcapital right hip fracture with no identified dislocation. The femoral head does appear to be rotated within the acetabulum. Electronically Signed   By: 12/29/2019 III M.D   On: 12/27/2019 17:23    Positive ROS: All other systems have been reviewed and were otherwise negative with the exception of those mentioned in the HPI and as above.  Physical Exam: General:  Alert, no acute distress Psychiatric:  Patient is competent for consent with normal mood and affect   Cardiovascular:  No pedal edema Respiratory:  No wheezing, non-labored breathing GI:  Abdomen is soft and non-tender Skin:  No lesions in the area of chief complaint Neurologic:  Sensation intact distally Lymphatic:  No axillary or cervical lymphadenopathy  Orthopedic Exam:  Orthopedic examination is limited to the right hip and lower extremity.  The right lower extremity is somewhat shortened and externally rotated as compared to the left.  Skin inspection around the right hip is unremarkable.  No swelling, erythema, ecchymosis, abrasions, or other skin abnormalities are identified.  He has mild tenderness to palpation over the lateral aspect of the right hip.  He has more severe pain with any attempted active or passive motion of the right hip.  His right lower leg (as his left lower leg, is wrapped in an Radio broadcast assistant dressing.  He has moderate to severe swelling of his right forefoot, but has good capillary refill to the toes.  Sensations intact light touch to all distributions.  He is able to dorsiflex and plantarflex his toes.  X-rays:  X-rays of the pelvis and right hip are available for review and have been reviewed by myself.  These films demonstrate a varus displaced right femoral neck fracture.  No significant degenerative changes of the  hip joint are noted.  No lytic lesions or other acute bony changes are identified.  Assessment: Closed displaced right femoral neck fracture.  Plan: The treatment options, including both surgical and nonsurgical choices, have been discussed in detail with the patient. The patient would like to proceed with surgical intervention to include a right hip hemiarthroplasty. The risks (including bleeding, infection, nerve and/or blood vessel injury, persistent or recurrent pain, loosening or failure of the components, leg length inequality, dislocation, need for further surgery, blood clots, strokes, heart attacks or arrhythmias, pneumonia, etc.) and benefits of the surgical procedure were discussed. The patient states his understanding and agrees to proceed. He agrees to a blood transfusion if necessary. A formal written consent will be obtained by the nursing staff.  Thank you for asking me to participate in the care of this most pleasant yet unfortunate man.  I will be happy to follow him with you.   Maryagnes Amos, MD  Beeper #:  [phone]  12/27/2019 10:23 PM

## 2019-12-27 NOTE — H&P (Signed)
History and Physical    Carlos Roach FWY:[id number] DOB: [birth year] DOA: 12/27/2019  PCP: Clent Jacks, PA-C   Patient coming from: Home  I have personally briefly reviewed patient's old medical records in [facility]  Chief Complaint: Status post fall  HPI: Carlos Roach is a 79 y.o. male with medical history significant for atrial fibrillation on chronic anticoagulation therapy with Eliquis, history of alcohol dependence, hypertension and sleep apnea who presents to the emergency room from [facility] for evaluation of pain in his right hip and inability to ambulate.  Patient is status post fall which happened on 09/11.  Patient fell while walking his dog landing on his right hip and shoulder.  He complains of pain in the right hip which he rates an 8 x 10 intensity at its worst and has been unable to ambulate.  He was seen at the orthopedic clinic where he had an x-ray which showed a right femoral neck intertrochanteric fracture. He denies feeling dizzy or lightheaded, no nausea, no vomiting, no chest pain, no shortness of breath, no urinary symptoms or changes in his bowel habits. Labs show sodium 136, potassium 3.6, chloride 96, bicarb 29, BUN 29, creatinine 0.86, calcium 9.4, AST 29, ALT 24, white count 8.7, hemoglobin 13.7, hematocrit 40, RDW 14.6, INR 1.3 Chest x-ray reviewed by me shows Right hip x-ray shows femoral neck Twelve-lead EKG reviewed by me shows atrial fibrillation with a rapid ventricular rate      ED Course: Patient is a 79 year old male with a history of chronic A. fib on anticoagulation, hypertension and alcohol abuse who is status post fall and presents to the ER for evaluation of right hip pain.  Patient is noted to have a femoral neck fracture and will be admitted to the hospital for further evaluation.   Review of Systems: As per HPI otherwise 10 point review of systems negative.    Past Medical History:  Diagnosis Date  . Alcohol use   .  Arrhythmia    atrial fibrillation  . Cellulitis   . Chronic atrial fibrillation (HCC)    a. CHADS2VASc 5 (CHF, HTN, age x 2, vascular disease)  . Chronic systolic CHF (congestive heart failure) (HCC)    a. TTE 1/19: EF 50%, mild LVH, trivial MR/PR/TR, small pericardial effusion  . Chronic venous stasis   . GI bleed   . Hypertension   . Obstructive sleep apnea     Past Surgical History:  Procedure Laterality Date  . ABDOMINAL SURGERY       reports that he has never smoked. He has never used smokeless tobacco. He reports previous alcohol use. He reports that he does not use drugs.  Allergies  Allergen Reactions  . Mucinex [Guaifenesin Er] Rash    Family History  Problem Relation Age of Onset  . Hypertension Father      Prior to Admission medications   Medication Sig Start Date End Date Taking? Authorizing Provider  ELIQUIS 5 MG TABS tablet Take 5 mg by mouth 2 (two) times daily.    Yes [provider]  diltiazem (CARDIZEM CD) 120 MG 24 hr capsule Take 1 capsule (120 mg total) by mouth daily. 06/11/17   Salary, Evelena Asa, MD  FEROSUL 325 (65 Fe) MG tablet TK 1 T PO D WITH BRE 10/01/18   [provider]  folic acid (FOLVITE) 800 MCG tablet Take 400 mcg by mouth daily.    [provider]  Melatonin 3 MG TBDP Take 1 tablet by  mouth at bedtime.    [provider]  metoprolol succinate (TOPROL-XL) 50 MG 24 hr tablet TK 1 T PO QD 10/12/18   [provider]  metoprolol tartrate (LOPRESSOR) 25 MG tablet Take 25 mg by mouth every 8 (eight) hours.    [provider]  Multiple Vitamin (MULTIVITAMIN) tablet Take 1 tablet by mouth daily.    [provider]  Omega-3 Fatty Acids (FISH OIL) 1000 MG CAPS Take 1 capsule by mouth 3 (three) times daily.    [provider]  Potassium Chloride ER 20 MEQ TBCR TK 2 TS PO QD 10/30/18   [provider]  potassium chloride SA (K-DUR,KLOR-CON) 20 MEQ tablet Take 20 mEq by mouth 2  (two) times daily.    [provider]  protein supplement shake (PREMIER PROTEIN) LIQD Take 325 mLs (11 oz total) by mouth 2 (two) times daily between meals. 06/10/17   Salary, Evelena Asa, MD  simvastatin (ZOCOR) 40 MG tablet Take 40 mg by mouth daily.    [provider]  tamsulosin (FLOMAX) 0.4 MG CAPS capsule Take 0.4 mg by mouth daily after breakfast.    [provider]  thiamine 100 MG tablet Take 100 mg by mouth daily.    [provider]  torsemide (DEMADEX) 20 MG tablet Take 1 tablet (20 mg total) by mouth 2 (two) times daily. 06/10/17   Angelina Ok D, MD    Physical Exam: Vitals:   12/27/19 1518 12/27/19 1621 12/27/19 1627  BP: 118/70 111/72   Pulse: (!) 115    Resp: 16  18  Temp: (!) 97.5 F (36.4 C)    TempSrc: Oral    SpO2: 99%  98%  Weight: 101.6 kg    Height: 6' (1.829 m)       Vitals:   12/27/19 1518 12/27/19 1621 12/27/19 1627  BP: 118/70 111/72   Pulse: (!) 115    Resp: 16  18  Temp: (!) 97.5 F (36.4 C)    TempSrc: Oral    SpO2: 99%  98%  Weight: 101.6 kg    Height: 6' (1.829 m)      Constitutional: NAD, alert and oriented x 3.  Chronically ill-appearing Eyes: PERRL, lids and conjunctivae normal ENMT: Mucous membranes are moist.  Neck: normal, supple, no masses, no thyromegaly Respiratory: clear to auscultation bilaterally, no wheezing, no crackles. Normal respiratory effort. No accessory muscle use.  Cardiovascular: Irregularly irregular, tachycardic no murmurs / rubs / gallops. No extremity edema. 2+ pedal pulses. No carotid bruits.  Abdomen: no tenderness, no masses palpated. No hepatosplenomegaly. Bowel sounds positive.  Musculoskeletal: Decreased range of motion right hip, no clubbing / cyanosis. No joint deformity upper and lower extremities.  Skin: no rashes, lesions, ulcers.  Neurologic: No gross focal neurologic deficit.  Generalized weakness Psychiatric: Normal mood and affect.   Labs on Admission: I have  personally reviewed following labs and imaging studies  CBC: Recent Labs  Lab 12/27/19 1610  WBC 8.7  NEUTROABS 6.6  HGB 13.7  HCT 40.0  MCV 85.3  PLT 241   Basic Metabolic Panel: Recent Labs  Lab 12/27/19 1610  NA 136  K 3.6  CL 96*  CO2 29  GLUCOSE 117*  BUN 29*  CREATININE 0.86  CALCIUM 9.4   GFR: Estimated Creatinine Clearance: 88.7 mL/min (by C-G formula based on SCr of 0.86 mg/dL). Liver Function Tests: Recent Labs  Lab 12/27/19 1610  AST 29  ALT 24  ALKPHOS 96  BILITOT 1.9*  PROT 8.2*  ALBUMIN 3.4*   No results for input(s): LIPASE, AMYLASE in the last 168 hours. No results for input(s): AMMONIA in the last 168 hours. Coagulation Profile: Recent Labs  Lab 12/27/19 1610  INR 1.3*   Cardiac Enzymes: No results for input(s): CKTOTAL, CKMB, CKMBINDEX, TROPONINI in the last 168 hours. BNP (last 3 results) No results for input(s): PROBNP in the last 8760 hours. HbA1C: No results for input(s): HGBA1C in the last 72 hours. CBG: No results for input(s): GLUCAP in the last 168 hours. Lipid Profile: No results for input(s): CHOL, HDL, LDLCALC, TRIG, CHOLHDL, LDLDIRECT in the last 72 hours. Thyroid Function Tests: No results for input(s): TSH, T4TOTAL, FREET4, T3FREE, THYROIDAB in the last 72 hours. Anemia Panel: No results for input(s): VITAMINB12, FOLATE, FERRITIN, TIBC, IRON, RETICCTPCT in the last 72 hours. Urine analysis:    Component Value Date/Time   COLORURINE AMBER (A) 06/04/2017 1135   APPEARANCEUR CLOUDY (A) 06/04/2017 1135   LABSPEC 1.016 06/04/2017 1135   PHURINE 5.0 06/04/2017 1135   GLUCOSEU NEGATIVE 06/04/2017 1135   HGBUR MODERATE (A) 06/04/2017 1135   BILIRUBINUR NEGATIVE 06/04/2017 1135   KETONESUR NEGATIVE 06/04/2017 1135   PROTEINUR 30 (A) 06/04/2017 1135   NITRITE NEGATIVE 06/04/2017 1135   LEUKOCYTESUR TRACE (A) 06/04/2017 1135    Radiological Exams on Admission: DG Chest 1 View  Result Date: 12/27/2019 CLINICAL DATA:   Right hip fracture. EXAM: CHEST  1 VIEW COMPARISON:  December 13, 2018 FINDINGS: Opacity in left retrocardiac region is unchanged since December 13, 2018. There is suggestion of a small left pleural effusion versus pleural thickening. Stable cardiomegaly. No other changes. IMPRESSION: Persistent opacity and small effusion in the left base, unchanged since December 13, 2018. No acute interval changes. Electronically Signed   By: Gerome Samavid  Williams III M.D   On: 12/27/2019 17:24   DG Hip Unilat With Pelvis 2-3 Views Right  Result Date: 12/27/2019 CLINICAL DATA:  Fall 6 days ago. EXAM: DG HIP (WITH OR WITHOUT PELVIS) 2-3V RIGHT COMPARISON:  None FINDINGS: There is a comminuted displaced subcapital right hip fracture. No dislocation of the femoral head is noted. The femoral head appears to be rotated within the acetabulum. No other bony abnormalities are noted. IMPRESSION: Comminuted displaced subcapital right hip fracture with no identified dislocation. The femoral head does appear to be rotated within the acetabulum. Electronically Signed   By: Gerome Samavid  Williams III M.D   On: 12/27/2019 17:23    EKG: Independently reviewed.  Atrial fibrillation with rapid ventricular rate  Assessment/Plan Principal Problem:   Closed displaced fracture of right femoral neck (HCC) Active Problems:   HTN (hypertension)   Atrial fibrillation (HCC)   Alcohol use   Fall    Right femoral neck fracture Patient is status post mechanical fall and has a right femoral neck intertrochanteric fracture Immobilize right lower extremity Pain control and muscle relaxants We will request orthopedic surgery consult   Atrial fibrillation with rapid ventricular rate Patient's rate is uncontrolled and this may likely be secondary to pain Optimize pain control Continue metoprolol and Cardizem for rate control Patient was on Eliquis as primary prophylaxis for an acute stroke but is to be placed on hold for planned  surgery  BPH Continue Flomax   History of CHF Stable and not in acute exacerbation Continue torsemide and metoprolol    DVT prophylaxis: SCD Code Status: Full code Family Communication: Greater than 50% of time was spent discussing plan of care with patient at the  bedside.  He verbalizes understanding and agrees with the plan. Disposition Plan: Back to previous home environment Consults called: Orthopedic surgery    Colene Mines MD [facility]     12/27/2019, 5:27 PM

## 2019-12-27 NOTE — ED Provider Notes (Signed)
[facility] Emergency Department Provider Note   ____________________________________________    I have reviewed the triage vital signs and the nursing notes.   HISTORY  Chief Complaint Fall     HPI Carlos Roach is a 79 y.o. male with history of atrial fibrillation on Eliquis who presents after a fall.  Patient reports he fell 6 days ago while walking his dog who pulled him forward.  He injured his right hip at that time, he thought it was getting better but decided today that he needed x-rays and went to urgent care.  X-rays there demonstrated right hip fracture, referred to the emergency department.  Has not take anything for this.  Complains of moderate to severe right hip pain especially with any movement.   Past Medical History:  Diagnosis Date  . Alcohol use   . Arrhythmia    atrial fibrillation  . Cellulitis   . Chronic atrial fibrillation (HCC)    a. CHADS2VASc 5 (CHF, HTN, age x 2, vascular disease)  . Chronic systolic CHF (congestive heart failure) (HCC)    a. TTE 1/19: EF 50%, mild LVH, trivial MR/PR/TR, small pericardial effusion  . Chronic venous stasis   . GI bleed   . Hypertension   . Obstructive sleep apnea     Patient Active Problem List   Diagnosis Date Noted  . HTN (hypertension) 06/15/2017  . Atrial fibrillation (HCC) 06/15/2017  . CHF (congestive heart failure) (HCC) 06/04/2017    Past Surgical History:  Procedure Laterality Date  . ABDOMINAL SURGERY      Prior to Admission medications   Medication Sig Start Date End Date Taking? Authorizing Provider  diltiazem (CARDIZEM CD) 120 MG 24 hr capsule Take 1 capsule (120 mg total) by mouth daily. 06/11/17   Salary, Evelena Asa, MD  ELIQUIS 5 MG TABS tablet TK 1 T PO Q 12 H 10/04/18   [provider]  FEROSUL 325 (65 Fe) MG tablet TK 1 T PO D WITH BRE 10/01/18   [provider]  folic acid (FOLVITE) 800 MCG tablet Take 400 mcg by mouth daily.    [provider]  Melatonin 3 MG TBDP Take 1 tablet by mouth at bedtime.    [provider]  metoprolol succinate (TOPROL-XL) 50 MG 24 hr tablet TK 1 T PO QD 10/12/18   [provider]  metoprolol tartrate (LOPRESSOR) 25 MG tablet Take 25 mg by mouth every 8 (eight) hours.    [provider]  Multiple Vitamin (MULTIVITAMIN) tablet Take 1 tablet by mouth daily.    [provider]  Omega-3 Fatty Acids (FISH OIL) 1000 MG CAPS Take 1 capsule by mouth 3 (three) times daily.    [provider]  Potassium Chloride ER 20 MEQ TBCR TK 2 TS PO QD 10/30/18   [provider]  potassium chloride SA (K-DUR,KLOR-CON) 20 MEQ tablet Take 20 mEq by mouth 2 (two) times daily.    [provider]  protein supplement shake (PREMIER PROTEIN) LIQD Take 325 mLs (11 oz total) by mouth 2 (two) times daily between meals. 06/10/17   Salary, Evelena Asa, MD  simvastatin (ZOCOR) 40 MG tablet Take 40 mg by mouth daily.    [provider]  tamsulosin (FLOMAX) 0.4 MG CAPS capsule Take 0.4 mg by mouth daily after breakfast.    [provider]  thiamine 100 MG tablet Take 100 mg by mouth daily.    [provider]  torsemide [facility])  20 MG tablet Take 1 tablet (20 mg total) by mouth 2 (two) times daily. 06/10/17   Salary, Evelena Asa, MD     Allergies Mucinex [guaifenesin er]  Family History  Problem Relation Age of Onset  . Hypertension Father     Social History Social History   Tobacco Use  . Smoking status: Never Smoker  . Smokeless tobacco: Never Used  Vaping Use  . Vaping Use: Never used  Substance Use Topics  . Alcohol use: Not Currently    Comment: no etoh for 3-4 months ago  . Drug use: No    Review of Systems  Constitutional: No fever/chills Eyes: No visual changes.  ENT: No sore throat. Cardiovascular: Denies chest pain. Respiratory: Denies shortness of breath. Gastrointestinal: No abdominal pain.  No nausea, no vomiting.    Genitourinary: Negative for dysuria. Musculoskeletal: Right hip pain as above Skin: Negative for rash. Neurological: Negative for headaches or weakness   ____________________________________________   PHYSICAL EXAM:  VITAL SIGNS: ED Triage Vitals [12/27/19 1518]  Enc Vitals Group     BP 118/70     Pulse Rate (!) 115     Resp 16     Temp (!) 97.5 F (36.4 C)     Temp Source Oral     SpO2 99 %     Weight 101.6 kg (224 lb)     Height 1.829 m (6')     Head Circumference      Peak Flow      Pain Score 1     Pain Loc      Pain Edu?      Excl. in GC?     Constitutional: Alert and oriented.   Head: Atraumatic.  Mouth/Throat: Mucous membranes are moist.   Neck: No vertebral tenderness palpation Cardiovascular: Normal rate, regular rhythm. Grossly normal heart sounds.  Good peripheral circulation. Respiratory: Normal respiratory effort.  No retractions. Lungs CTAB. Gastrointestinal: Soft and nontender. No distention.  No CVA tenderness.  Musculoskeletal: Right leg is mildly externally rotated, held in slight flexion, 2+ distal pulses, otherwise normal range of motion of all extremities Neurologic:  Normal speech and language. No gross focal neurologic deficits are appreciated.  Skin:  Skin is warm, dry and intact. No rash noted. Psychiatric: Mood and affect are normal. Speech and behavior are normal.  ____________________________________________   LABS (all labs ordered are listed, but only abnormal results are displayed)  Labs Reviewed  PROTIME-INR - Abnormal; Notable for the following components:      Result Value   Prothrombin Time 16.1 (*)    INR 1.3 (*)    All other components within normal limits  COMPREHENSIVE METABOLIC PANEL - Abnormal; Notable for the following components:   Chloride 96 (*)    Glucose, Bld 117 (*)    BUN 29 (*)    Total Protein 8.2 (*)    Albumin 3.4 (*)    Total Bilirubin 1.9 (*)    All other components within normal limits  APTT -  Abnormal; Notable for the following components:   aPTT 45 (*)    All other components within normal limits  SARS CORONAVIRUS 2 BY RT PCR (HOSPITAL ORDER, PERFORMED IN  HOSPITAL LAB)  CBC WITH DIFFERENTIAL/PLATELET  TYPE AND SCREEN   ____________________________________________  EKG  ED ECG REPORT I, Jene Every, the attending physician, personally viewed and interpreted this ECG.  Date: 12/27/2019  Rhythm: Atrial fibrillation QRS Axis: normal Intervals: Abnormal ST/T Wave abnormalities: normal Narrative Interpretation: no  evidence of acute ischemia  ____________________________________________  RADIOLOGY  Pelvis and right hip x-ray from outside facility viewed by me, positive subcapital hip fracture on the right ____________________________________________   PROCEDURES  Procedure(s) performed: No  Procedures   Critical Care performed: No ____________________________________________   INITIAL IMPRESSION / ASSESSMENT AND PLAN / ED COURSE  Pertinent labs & imaging results that were available during my care of the patient were reviewed by me and considered in my medical decision making (see chart for details).  Patient presents with fall, mechanical, 6 days ago, outpatient images demonstrate right subcapital hip fracture. Patient is on Eliquis for atrial fibrillation.  We'll obtain additional images here, give IV morphine, IV Zofran.  EKG obtained.  Discussed with Dr. Joice Lofts of orthopedics, he will consult   Pending labs, will consult hospitalist service for admission    ____________________________________________   FINAL CLINICAL IMPRESSION(S) / ED DIAGNOSES  Final diagnoses:  Closed fracture of right hip, initial encounter (HCC)  Longstanding persistent atrial fibrillation (HCC)        Note:  This document was prepared using Dragon voice recognition software and may include unintentional dictation errors.   Jene Every,  MD 12/27/19 [phone]

## 2019-12-27 NOTE — ED Notes (Signed)
See triage note    Presents s/p fall on Saturday  States he landed on right hip  Was seen at [facility] and told that had has a right femoral neck fx  States he has not been able to walk on it this week

## 2019-12-28 DIAGNOSIS — I83009 Varicose veins of unspecified lower extremity with ulcer of unspecified site: Secondary | ICD-10-CM

## 2019-12-28 DIAGNOSIS — L97909 Non-pressure chronic ulcer of unspecified part of unspecified lower leg with unspecified severity: Secondary | ICD-10-CM

## 2019-12-28 LAB — BASIC METABOLIC PANEL
Anion gap: 10 (ref 5–15)
BUN: 28 mg/dL — ABNORMAL HIGH (ref 8–23)
CO2: 28 mmol/L (ref 22–32)
Calcium: 9.1 mg/dL (ref 8.9–10.3)
Chloride: 98 mmol/L (ref 98–111)
Creatinine, Ser: 0.75 mg/dL (ref 0.61–1.24)
GFR calc Af Amer: >60 mL/min (ref >60)
GFR calc non Af Amer: >60 mL/min (ref >60)
Glucose, Bld: 100 mg/dL — ABNORMAL HIGH (ref 70–99)
Potassium: 3.8 mmol/L (ref 3.5–5.1)
Sodium: 136 mmol/L (ref 135–145)

## 2019-12-28 LAB — CBC
HCT: 36.8 % — ABNORMAL LOW (ref 39.0–52.0)
Hemoglobin: 12.4 g/dL — ABNORMAL LOW (ref 13.0–17.0)
MCH: 29.2 pg (ref 26.0–34.0)
MCHC: 33.7 g/dL (ref 30.0–36.0)
MCV: 86.8 fL (ref 80.0–100.0)
Platelets: 222 10*3/uL (ref 150–400)
RBC: 4.24 MIL/uL (ref 4.22–5.81)
RDW: 14.4 % (ref 11.5–15.5)
WBC: 6.6 10*3/uL (ref 4.0–10.5)
nRBC: 0 % (ref 0.0–0.2)

## 2019-12-28 MED ORDER — HEPARIN SODIUM (PORCINE) 5000 UNIT/ML IJ SOLN: 5000.0000 [IU] | Freq: Three times a day (TID) | INTRAMUSCULAR | Status: AC

## 2019-12-28 NOTE — ED Notes (Signed)
Pt transported to room 155 

## 2019-12-28 NOTE — Progress Notes (Signed)
PROGRESS NOTE    Carlos Roach   VOH:[id number]  DOB: 21-Jan-1941  DOA: 12/27/2019     1  PCP: Jannetta Quint  CC: Larey Seat at home onto right side  Hospital Course: Carlos Roach is a 79 year old Caucasian male with PMH OSA, hypertension, chronic systolic CHF, A. fib on Eliquis, alcohol use who presented to the ER after referral from [facility].  He had a mechanical fall at home on 12/21/2019 landing on his right side. He was trying to recover at home but was mostly bedbound and only getting to the chair or bathroom by pivoting and not putting much weight on his right lower extremity. He had an x-ray at the orthopedic clinic which revealed a right femoral neck fracture and was sent to the ER. Repeat x-ray performed on admission confirmed a comminuted displaced subcapital right hip fracture. Orthopedic surgery evaluated the patient and after 48 hours of holding Eliquis, he is planning to undergo repair on 12/29/2019.   Interval History:  Seen resting in bed this morning in no distress.  Old records reviewed in assessment of this patient  ROS: Constitutional: negative for chills and fevers, Respiratory: negative for cough, Cardiovascular: negative for chest pain and Gastrointestinal: negative for abdominal pain  Assessment & Plan: Alcohol use -Monitor for any signs of withdrawal but has been pleasant with no concerning symptoms at this time  Atrial fibrillation (HCC) -Continue holding Eliquis - HSQ for DVT ppx until surgery - continue cardizem  Closed displaced fracture of right femoral neck (HCC) - s/p mechanical fall at home on 9/11; has not been able to ambulate and ultimately presented on 12/27/2019 for evaluation and was found to have fracture as noted -Orthopedic surgery following, appreciate assistance. Tentative plan is for surgery on 12/29/2019 -Continue pain control  HTN (hypertension) - continue cardizem  Chronic cutaneous venous stasis ulcer (HCC) -Follows with  outpatient wound center. Last note reviewed from 11/29/2019 -Continue bilateral Unna boots -inpatient consult to wound care while hospitalized    Antimicrobials: Ancef 9/19>>present  DVT prophylaxis: HSQ Code Status: DNR Family Communication: none present Disposition Plan: Status is: Inpatient  Remains inpatient appropriate because:Unsafe d/c plan, IV treatments appropriate due to intensity of illness or inability to take PO and Inpatient level of care appropriate due to severity of illness   Dispo: The patient is from: Home              Anticipated d/c is to: pending PT eval after surgery              Anticipated d/c date is: 3 days              Patient currently is not medically stable to d/c.       Objective: Blood pressure 97/65, pulse 75, temperature 97.9 F (36.6 C), temperature source Oral, resp. rate 16, height 6' (1.829 m), weight 101.6 kg, SpO2 100 %.  Examination: General appearance: Pleasant elderly man lying in bed in no distress Head: Normocephalic, without obvious abnormality, atraumatic Eyes: EOMI Lungs: clear to auscultation bilaterally Heart: irregularly irregular rhythm and S1, S2 normal Abdomen: normal findings: bowel sounds normal and soft, non-tender Extremities: Bilateral unna boots noted; exposed feet are noted to have 3+ edema worse on the right Skin: Chronic stasis changes noted in bilateral lower extremities Neurologic: Right lower extremity strength limited by pain and hip but no obvious focal deficits  Consultants:   Ortho  Procedures:   none  Data Reviewed: I have personally reviewed following labs and  imaging studies Results for orders placed or performed during the hospital encounter of 12/27/19 (from the past 24 hour(s))  CBC     Status: Abnormal   Collection Time: 12/28/19  5:25 AM  Result Value Ref Range   WBC 6.6 4.0 - 10.5 K/uL   RBC 4.24 4.22 - 5.81 MIL/uL   Hemoglobin 12.4 (L) 13.0 - 17.0 g/dL   HCT 32.3 (L) 39 - 52 %    MCV 86.8 80.0 - 100.0 fL   MCH 29.2 26.0 - 34.0 pg   MCHC 33.7 30.0 - 36.0 g/dL   RDW 55.7 32.2 - 02.5 %   Platelets 222 150 - 400 K/uL   nRBC 0.0 0.0 - 0.2 %  Basic metabolic panel     Status: Abnormal   Collection Time: 12/28/19  5:25 AM  Result Value Ref Range   Sodium 136 135 - 145 mmol/L   Potassium 3.8 3.5 - 5.1 mmol/L   Chloride 98 98 - 111 mmol/L   CO2 28 22 - 32 mmol/L   Glucose, Bld 100 (H) 70 - 99 mg/dL   BUN 28 (H) 8 - 23 mg/dL   Creatinine, Ser 4.27 0.61 - 1.24 mg/dL   Calcium 9.1 8.9 - 06.2 mg/dL   GFR calc non Af Amer >60 >60 mL/min   GFR calc Af Amer >60 >60 mL/min   Anion gap 10 5 - 15    Recent Results (from the past 240 hour(s))  SARS Coronavirus 2 by RT PCR (hospital order, performed in [facility] hospital lab) Nasopharyngeal Nasopharyngeal Swab     Status: None   Collection Time: 12/27/19  4:10 PM   Specimen: Nasopharyngeal Swab  Result Value Ref Range Status   SARS Coronavirus 2 NEGATIVE NEGATIVE Final    Comment: (NOTE) SARS-CoV-2 target nucleic acids are NOT DETECTED.  The SARS-CoV-2 RNA is generally detectable in upper and lower respiratory specimens during the acute phase of infection. The lowest concentration of SARS-CoV-2 viral copies this assay can detect is 250 copies / mL. A negative result does not preclude SARS-CoV-2 infection and should not be used as the sole basis for treatment or other patient management decisions.  A negative result may occur with improper specimen collection / handling, submission of specimen other than nasopharyngeal swab, presence of viral mutation(s) within the areas targeted by this assay, and inadequate number of viral copies (<250 copies / mL). A negative result must be combined with clinical observations, patient history, and epidemiological information.  Fact Sheet for Patients:   BoilerBrush.com.cy  Fact Sheet for Healthcare  Providers: https://pope.com/  This test is not yet approved or  cleared by the [place] FDA and has been authorized for detection and/or diagnosis of SARS-CoV-2 by FDA under an Emergency Use Authorization (EUA).  This EUA will remain in effect (meaning this test can be used) for the duration of the COVID-19 declaration under Section 564(b)(1) of the Act, 21 U.S.C. section 360bbb-3(b)(1), unless the authorization is terminated or revoked sooner.  Performed at [facility], [address] [place], [place] [address]      Radiology Studies: DG Chest 1 View  Result Date: 12/27/2019 CLINICAL DATA:  Right hip fracture. EXAM: CHEST  1 VIEW COMPARISON:  December 13, 2018 FINDINGS: Opacity in left retrocardiac region is unchanged since December 13, 2018. There is suggestion of a small left pleural effusion versus pleural thickening. Stable cardiomegaly. No other changes. IMPRESSION: Persistent opacity and small effusion in the left base, unchanged since December 13, 2018. No acute interval changes. Electronically Signed   By: Gerome Sam III M.D   On: 12/27/2019 17:24   DG Hip Unilat With Pelvis 2-3 Views Right  Result Date: 12/27/2019 CLINICAL DATA:  Fall 6 days ago. EXAM: DG HIP (WITH OR WITHOUT PELVIS) 2-3V RIGHT COMPARISON:  None FINDINGS: There is a comminuted displaced subcapital right hip fracture. No dislocation of the femoral head is noted. The femoral head appears to be rotated within the acetabulum. No other bony abnormalities are noted. IMPRESSION: Comminuted displaced subcapital right hip fracture with no identified dislocation. The femoral head does appear to be rotated within the acetabulum. Electronically Signed   By: Gerome Sam III M.D   On: 12/27/2019 17:23   DG Hip Unilat With Pelvis 2-3 Views Right  Final Result    DG Chest 1 View  Final Result      Scheduled Meds: . diltiazem  120 mg Oral Daily  . ferrous sulfate  325 mg  Oral BID WC  . heparin  5,000 Units Subcutaneous Q8H  . melatonin  2.5 mg Oral QHS  . metoprolol succinate  50 mg Oral Daily  . multivitamin with minerals  1 tablet Oral Daily  . omega-3 acid ethyl esters  1 g Oral BID  . potassium chloride SA  20 mEq Oral BID  . simvastatin  40 mg Oral Daily  . tamsulosin  0.4 mg Oral QPC breakfast  . thiamine  100 mg Oral Daily  . torsemide  20 mg Oral BID   PRN Meds: HYDROcodone-acetaminophen, methocarbamol **OR** methocarbamol (ROBAXIN) IV, morphine injection, polyethylene glycol Continuous Infusions: . [START ON 12/29/2019]  ceFAZolin (ANCEF) IV    . methocarbamol (ROBAXIN) IV        LOS: 1 day  Time spent: Greater than 50% of the 35 minute visit was spent in counseling/coordination of care for the patient as laid out in the A&P.   Lewie Chamber, MD [facility] 12/28/2019, 5:23 PM  Contact via secure chat.  To contact the attending provider between 7A-7P or the covering provider during after hours 7P-7A, please log into the web site [email or web address] and access using universal [place] password for that web site. If you do not have the password, please call the hospital operator.

## 2019-12-28 NOTE — Assessment & Plan Note (Signed)
-   continue cardizem

## 2019-12-28 NOTE — Assessment & Plan Note (Addendum)
-  Eliquis resumed 9/20 - continue cardizem

## 2019-12-28 NOTE — Assessment & Plan Note (Signed)
-  Monitor for any signs of withdrawal but has been pleasant with no concerning symptoms at this time

## 2019-12-28 NOTE — Assessment & Plan Note (Addendum)
-   Bilateral LE  -Follows with outpatient wound center. Last note reviewed from 11/29/2019 - evaluated by WOC, appreciate recommendations while inpatient, see below: "cleansing, moisturizing (with Eucerin) and dressing with xeroform gauze topped with dry gauze prior to rapping from toes to knee with Kerlix roll gauze topped with ACE bandage.  Heels are to be floated and LEs elevated."

## 2019-12-28 NOTE — Plan of Care (Signed)
  Problem: Education: Goal: Knowledge of General Education information will improve Description: Including pain rating scale, medication(s)/side effects and non-pharmacologic comfort measures Outcome: Progressing   Problem: Health Behavior/Discharge Planning: Goal: Ability to manage health-related needs will improve Outcome: Progressing   Problem: Clinical Measurements: Goal: Ability to maintain clinical measurements within normal limits will improve Outcome: Progressing Goal: Will remain free from infection Outcome: Progressing Goal: Respiratory complications will improve Outcome: Progressing   Problem: Activity: Goal: Risk for activity intolerance will decrease Outcome: Progressing   Problem: Nutrition: Goal: Adequate nutrition will be maintained Outcome: Progressing   Problem: Coping: Goal: Level of anxiety will decrease Outcome: Progressing   Problem: Elimination: Goal: Will not experience complications related to bowel motility Outcome: Progressing Goal: Will not experience complications related to urinary retention Outcome: Progressing   Problem: Pain Managment: Goal: General experience of comfort will improve Outcome: Progressing   Problem: Safety: Goal: Ability to remain free from injury will improve Outcome: Progressing   Problem: Skin Integrity: Goal: Risk for impaired skin integrity will decrease Outcome: Progressing   

## 2019-12-28 NOTE — Hospital Course (Addendum)
Mr. Engelmann is a 79 year old Caucasian male with PMH OSA, hypertension, chronic systolic CHF, A. fib on Eliquis, alcohol use who presented to the ER after referral from [facility].  He had a mechanical fall at home on 12/21/2019 landing on his right side. He was trying to recover at home but was mostly bedbound and only getting to the chair or bathroom by pivoting and not putting much weight on his right lower extremity. He had an x-ray at the orthopedic clinic which revealed a right femoral neck fracture and was sent to the ER. Repeat x-ray performed on admission confirmed a comminuted displaced subcapital right hip fracture. Orthopedic surgery evaluated the patient and after 48 hours of holding Eliquis, he is planning to undergo repair.  Right hip hemiarthroplasty was performed on 12/29/19.  He began working with PT on 9/20 and Eliquis was resumed. He will plan to go to rehab at discharge also. WOC was consulted regarding his LE ulcers wrapped in Unna boots on admission with recommendations given. He will need to continue outpatient routine care with his wound center also after discharge.

## 2019-12-28 NOTE — Assessment & Plan Note (Addendum)
-   s/p mechanical fall at home on 9/11; has not been able to ambulate and ultimately presented on 12/27/2019 for evaluation and was found to have fracture as noted -Orthopedic surgery following, appreciate assistance.  - s/p right hip hemiarthroplasty 12/29/19 -Continue pain control - await bed placement at SNF

## 2019-12-29 ENCOUNTER — Encounter
Admission: EM | Disposition: A | Discharge status: Skilled Nursing Facility | Payer: Self-pay | Source: Home / Self Care | Attending: Internal Medicine

## 2019-12-29 ENCOUNTER — Inpatient Hospital Stay: Payer: Medicare PPO | Admitting: Anesthesiology

## 2019-12-29 ENCOUNTER — Inpatient Hospital Stay: Payer: Medicare PPO

## 2019-12-29 ENCOUNTER — Encounter: Payer: Self-pay | Admitting: Internal Medicine

## 2019-12-29 HISTORY — PX: HIP ARTHROPLASTY: SHX981

## 2019-12-29 LAB — CBC WITH DIFFERENTIAL/PLATELET
Abs Immature Granulocytes: 0.04 10*3/uL (ref 0.00–0.07)
Basophils Absolute: 0.1 10*3/uL (ref 0.0–0.1)
Basophils Relative: 1 %
Eosinophils Absolute: 0.3 10*3/uL (ref 0.0–0.5)
Eosinophils Relative: 4 %
HCT: 38.8 % — ABNORMAL LOW (ref 39.0–52.0)
Hemoglobin: 12.5 g/dL — ABNORMAL LOW (ref 13.0–17.0)
Immature Granulocytes: 1 %
Lymphocytes Relative: 14 %
Lymphs Abs: 1 10*3/uL (ref 0.7–4.0)
MCH: 28.7 pg (ref 26.0–34.0)
MCHC: 32.2 g/dL (ref 30.0–36.0)
MCV: 89 fL (ref 80.0–100.0)
Monocytes Absolute: 0.7 10*3/uL (ref 0.1–1.0)
Monocytes Relative: 10 %
Neutro Abs: 5.1 10*3/uL (ref 1.7–7.7)
Neutrophils Relative %: 70 %
Platelets: 251 10*3/uL (ref 150–400)
RBC: 4.36 MIL/uL (ref 4.22–5.81)
RDW: 14.1 % (ref 11.5–15.5)
WBC: 7.2 10*3/uL (ref 4.0–10.5)
nRBC: 0 % (ref 0.0–0.2)

## 2019-12-29 LAB — BASIC METABOLIC PANEL
Anion gap: 8 (ref 5–15)
BUN: 32 mg/dL — ABNORMAL HIGH (ref 8–23)
CO2: 31 mmol/L (ref 22–32)
Calcium: 9.2 mg/dL (ref 8.9–10.3)
Chloride: 97 mmol/L — ABNORMAL LOW (ref 98–111)
Creatinine, Ser: 0.95 mg/dL (ref 0.61–1.24)
GFR calc Af Amer: >60 mL/min (ref >60)
GFR calc non Af Amer: >60 mL/min (ref >60)
Glucose, Bld: 106 mg/dL — ABNORMAL HIGH (ref 70–99)
Potassium: 4.3 mmol/L (ref 3.5–5.1)
Sodium: 136 mmol/L (ref 135–145)

## 2019-12-29 LAB — MAGNESIUM: Magnesium: 2.2 mg/dL (ref 1.7–2.4)

## 2019-12-29 SURGERY — HEMIARTHROPLASTY, HIP, DIRECT ANTERIOR APPROACH, FOR FRACTURE
Anesthesia: General | Site: Hip | Laterality: Right

## 2019-12-29 MED ORDER — OXYCODONE HCL 5 MG PO TABS: 5.0000 mg | ORAL_TABLET | Freq: Once | ORAL | Status: DC | PRN

## 2019-12-29 MED ORDER — DEXAMETHASONE SODIUM PHOSPHATE 10 MG/ML IJ SOLN
INTRAMUSCULAR | Status: AC
  Filled 2019-12-29: qty 1

## 2019-12-29 MED ORDER — ONDANSETRON HCL 4 MG PO TABS: 4.0000 mg | ORAL_TABLET | Freq: Four times a day (QID) | ORAL | Status: DC | PRN

## 2019-12-29 MED ORDER — OXYCODONE HCL 5 MG/5ML PO SOLN: 5.0000 mg | Freq: Once | ORAL | Status: DC | PRN

## 2019-12-29 MED ORDER — DOCUSATE SODIUM 100 MG PO CAPS
100.0000 mg | ORAL_CAPSULE | Freq: Two times a day (BID) | ORAL | Status: DC
  Administered 2019-12-29 – 2020-01-02 (×7): 100 mg via ORAL
  Filled 2019-12-29 (×8): qty 1

## 2019-12-29 MED ORDER — LIDOCAINE HCL (CARDIAC) PF 100 MG/5ML IV SOSY
PREFILLED_SYRINGE | INTRAVENOUS | Status: DC | PRN
  Administered 2019-12-29: 100 mg via INTRAVENOUS

## 2019-12-29 MED ORDER — LIDOCAINE HCL (PF) 2 % IJ SOLN
INTRAMUSCULAR | Status: AC
  Filled 2019-12-29: qty 5

## 2019-12-29 MED ORDER — CEFAZOLIN SODIUM-DEXTROSE 2-4 GM/100ML-% IV SOLN
2.0000 g | Freq: Four times a day (QID) | INTRAVENOUS | Status: AC
  Administered 2019-12-29 – 2019-12-30 (×3): 2 g via INTRAVENOUS
  Filled 2019-12-29 (×3): qty 100

## 2019-12-29 MED ORDER — METOCLOPRAMIDE HCL 10 MG PO TABS: 5.0000 mg | ORAL_TABLET | Freq: Three times a day (TID) | ORAL | Status: DC | PRN

## 2019-12-29 MED ORDER — VITAMIN B-12 1000 MCG PO TABS
500.0000 ug | ORAL_TABLET | Freq: Every day | ORAL | Status: DC
  Administered 2019-12-30 – 2020-01-02 (×4): 500 ug via ORAL
  Filled 2019-12-29 (×4): qty 1

## 2019-12-29 MED ORDER — METOPROLOL TARTRATE 5 MG/5ML IV SOLN
INTRAVENOUS | Status: DC | PRN
  Administered 2019-12-29: 5 mg via INTRAVENOUS

## 2019-12-29 MED ORDER — SODIUM CHLORIDE FLUSH 0.9 % IV SOLN
INTRAVENOUS | Status: DC | PRN
  Administered 2019-12-29: 100 mL

## 2019-12-29 MED ORDER — ROCURONIUM BROMIDE 10 MG/ML (PF) SYRINGE
PREFILLED_SYRINGE | INTRAVENOUS | Status: AC
  Filled 2019-12-29: qty 10

## 2019-12-29 MED ORDER — DIPHENHYDRAMINE HCL 12.5 MG/5ML PO ELIX: 12.5000 mg | ORAL_SOLUTION | ORAL | Status: DC | PRN

## 2019-12-29 MED ORDER — FLEET ENEMA 7-19 GM/118ML RE ENEM: 1.0000 | ENEMA | Freq: Once | RECTAL | Status: DC | PRN

## 2019-12-29 MED ORDER — ACETAMINOPHEN 325 MG PO TABS: 650.0000 mg | ORAL_TABLET | ORAL | Status: DC | PRN

## 2019-12-29 MED ORDER — PHENYLEPHRINE HCL (PRESSORS) 10 MG/ML IV SOLN
INTRAVENOUS | Status: DC | PRN
  Administered 2019-12-29: 100 ug via INTRAVENOUS

## 2019-12-29 MED ORDER — MAGNESIUM HYDROXIDE 400 MG/5ML PO SUSP
30.0000 mL | Freq: Every day | ORAL | Status: DC | PRN
  Administered 2019-12-30: 30 mL via ORAL
  Filled 2019-12-29: qty 30

## 2019-12-29 MED ORDER — ACETAMINOPHEN 325 MG PO TABS: 325.0000 mg | ORAL_TABLET | Freq: Four times a day (QID) | ORAL | Status: DC | PRN

## 2019-12-29 MED ORDER — OXYCODONE HCL 5 MG PO TABS: 5.0000 mg | ORAL_TABLET | ORAL | Status: DC | PRN

## 2019-12-29 MED ORDER — CEFAZOLIN SODIUM 1 G IJ SOLR
INTRAMUSCULAR | Status: AC
  Filled 2019-12-29: qty 10

## 2019-12-29 MED ORDER — TORSEMIDE 20 MG PO TABS
60.0000 mg | ORAL_TABLET | Freq: Every day | ORAL | Status: DC
  Administered 2019-12-29: 60 mg via ORAL
  Filled 2019-12-29 (×2): qty 3

## 2019-12-29 MED ORDER — PROPOFOL 10 MG/ML IV BOLUS
INTRAVENOUS | Status: AC
  Filled 2019-12-29: qty 20

## 2019-12-29 MED ORDER — FENTANYL CITRATE (PF) 100 MCG/2ML IJ SOLN
INTRAMUSCULAR | Status: DC | PRN
  Administered 2019-12-29: 50 ug via INTRAVENOUS
  Administered 2019-12-29: 100 ug via INTRAVENOUS
  Administered 2019-12-29: 50 ug via INTRAVENOUS

## 2019-12-29 MED ORDER — ROCURONIUM BROMIDE 100 MG/10ML IV SOLN
INTRAVENOUS | Status: DC | PRN
  Administered 2019-12-29: 50 mg via INTRAVENOUS
  Administered 2019-12-29: 20 mg via INTRAVENOUS

## 2019-12-29 MED ORDER — PROPOFOL 10 MG/ML IV BOLUS
INTRAVENOUS | Status: DC | PRN
  Administered 2019-12-29: 150 mg via INTRAVENOUS

## 2019-12-29 MED ORDER — ONDANSETRON HCL 4 MG/2ML IJ SOLN
INTRAMUSCULAR | Status: DC | PRN
  Administered 2019-12-29: 4 mg via INTRAVENOUS

## 2019-12-29 MED ORDER — ONDANSETRON HCL 4 MG/2ML IJ SOLN
INTRAMUSCULAR | Status: AC
  Filled 2019-12-29: qty 2

## 2019-12-29 MED ORDER — APIXABAN 5 MG PO TABS
5.0000 mg | ORAL_TABLET | Freq: Two times a day (BID) | ORAL | Status: DC
  Administered 2019-12-30 – 2020-01-02 (×7): 5 mg via ORAL
  Filled 2019-12-29 (×7): qty 1

## 2019-12-29 MED ORDER — SODIUM CHLORIDE 0.9 % IV SOLN: INTRAVENOUS | Status: DC

## 2019-12-29 MED ORDER — ENSURE SURGERY PO LIQD
237.0000 mL | Freq: Two times a day (BID) | ORAL | Status: DC
  Administered 2019-12-30 – 2020-01-01 (×5): 237 mL via ORAL
  Filled 2019-12-29: qty 237

## 2019-12-29 MED ORDER — FENTANYL CITRATE (PF) 100 MCG/2ML IJ SOLN
INTRAMUSCULAR | Status: AC
  Filled 2019-12-29: qty 4

## 2019-12-29 MED ORDER — BUPIVACAINE-EPINEPHRINE (PF) 0.5% -1:200000 IJ SOLN
INTRAMUSCULAR | Status: DC | PRN
  Administered 2019-12-29: 30 mL

## 2019-12-29 MED ORDER — PHENYLEPHRINE HCL (PRESSORS) 10 MG/ML IV SOLN
INTRAVENOUS | Status: AC
  Filled 2019-12-29: qty 1

## 2019-12-29 MED ORDER — BISACODYL 10 MG RE SUPP: 10.0000 mg | Freq: Every day | RECTAL | Status: DC | PRN

## 2019-12-29 MED ORDER — ACETAMINOPHEN 500 MG PO TABS
1000.0000 mg | ORAL_TABLET | Freq: Four times a day (QID) | ORAL | Status: AC
  Administered 2019-12-29 – 2019-12-30 (×4): 1000 mg via ORAL
  Filled 2019-12-29 (×4): qty 2

## 2019-12-29 MED ORDER — METOCLOPRAMIDE HCL 5 MG/ML IJ SOLN: 5.0000 mg | Freq: Three times a day (TID) | INTRAMUSCULAR | Status: DC | PRN

## 2019-12-29 MED ORDER — DEXAMETHASONE SODIUM PHOSPHATE 10 MG/ML IJ SOLN
INTRAMUSCULAR | Status: DC | PRN
  Administered 2019-12-29: 5 mg via INTRAVENOUS

## 2019-12-29 MED ORDER — SUGAMMADEX SODIUM 200 MG/2ML IV SOLN
INTRAVENOUS | Status: DC | PRN
  Administered 2019-12-29: 200 mg via INTRAVENOUS

## 2019-12-29 MED ORDER — PHENYLEPHRINE HCL-NACL 20-0.9 MG/250ML-% IV SOLN
INTRAVENOUS | Status: DC | PRN
  Administered 2019-12-29: 50 ug/min via INTRAVENOUS

## 2019-12-29 MED ORDER — MORPHINE SULFATE (PF) 2 MG/ML IV SOLN
2.0000 mg | INTRAVENOUS | Status: DC | PRN
  Administered 2019-12-29: 2 mg via INTRAVENOUS
  Filled 2019-12-29: qty 1

## 2019-12-29 MED ORDER — ONDANSETRON HCL 4 MG/2ML IJ SOLN: 4.0000 mg | Freq: Four times a day (QID) | INTRAMUSCULAR | Status: DC | PRN

## 2019-12-29 MED ORDER — FENTANYL CITRATE (PF) 100 MCG/2ML IJ SOLN: 25.0000 ug | INTRAMUSCULAR | Status: DC | PRN

## 2019-12-29 MED ORDER — LACTATED RINGERS IV SOLN: INTRAVENOUS | Status: DC | PRN

## 2019-12-29 MED ORDER — TRAMADOL HCL 50 MG PO TABS
50.0000 mg | ORAL_TABLET | Freq: Four times a day (QID) | ORAL | Status: DC
  Administered 2019-12-29 – 2020-01-02 (×12): 50 mg via ORAL
  Filled 2019-12-29 (×14): qty 1

## 2019-12-29 MED ORDER — FOLIC ACID 1 MG PO TABS
500.0000 ug | ORAL_TABLET | Freq: Every day | ORAL | Status: DC
  Administered 2019-12-30 – 2020-01-02 (×4): 0.5 mg via ORAL
  Filled 2019-12-29 (×4): qty 1

## 2019-12-29 MED ORDER — BUPIVACAINE LIPOSOME 1.3 % IJ SUSP: INTRAMUSCULAR | Status: DC | PRN

## 2019-12-29 SURGICAL SUPPLY — 67 items
BAG DECANTER FOR FLEXI CONT (MISCELLANEOUS) IMPLANT
BIT DRILL JC 5IN 2.4M 127 24FL (BIT) ×1 IMPLANT
BLADE SAGITTAL WIDE XTHICK NO (BLADE) ×2 IMPLANT
BLADE SURG SZ20 CARB STEEL (BLADE) ×2 IMPLANT
BNDG COHESIVE 6X5 TAN STRL LF (GAUZE/BANDAGES/DRESSINGS) ×2 IMPLANT
BOWL CEMENT MIXING ADV NOZZLE (MISCELLANEOUS) IMPLANT
CANISTER SUCT 1200ML W/VALVE (MISCELLANEOUS) ×2 IMPLANT
CANISTER SUCT 3000ML PPV (MISCELLANEOUS) ×4 IMPLANT
CHLORAPREP W/TINT 26 (MISCELLANEOUS) ×4 IMPLANT
COVER WAND RF STERILE (DRAPES) ×2 IMPLANT
DECANTER SPIKE VIAL GLASS SM (MISCELLANEOUS) ×4 IMPLANT
DRAPE 3/4 80X56 (DRAPES) ×2 IMPLANT
DRAPE INCISE IOBAN 66X60 STRL (DRAPES) ×2 IMPLANT
DRAPE SPLIT 6X30 W/TAPE (DRAPES) ×4 IMPLANT
DRAPE SURG 17X11 SM STRL (DRAPES) ×2 IMPLANT
DRAPE SURG 17X23 STRL (DRAPES) ×2 IMPLANT
DRSG OPSITE POSTOP 4X12 (GAUZE/BANDAGES/DRESSINGS) ×2 IMPLANT
DRSG OPSITE POSTOP 4X14 (GAUZE/BANDAGES/DRESSINGS) IMPLANT
DRSG OPSITE POSTOP 4X8 (GAUZE/BANDAGES/DRESSINGS) ×2 IMPLANT
ELECT BLADE 6.5 EXT (BLADE) ×2 IMPLANT
ELECT CAUTERY BLADE 6.4 (BLADE) ×2 IMPLANT
ELECT REM PT RETURN 9FT ADLT (ELECTROSURGICAL) ×2
ELECTRODE REM PT RTRN 9FT ADLT (ELECTROSURGICAL) ×1 IMPLANT
GAUZE PACK 2X3YD (PACKING) IMPLANT
GAUZE XEROFORM 1X8 LF (GAUZE/BANDAGES/DRESSINGS) ×1 IMPLANT
GLOVE BIO SURGEON STRL SZ8 (GLOVE) ×4 IMPLANT
GLOVE BIOGEL M STRL SZ7.5 (GLOVE) IMPLANT
GLOVE BIOGEL PI IND STRL 8 (GLOVE) IMPLANT
GLOVE BIOGEL PI INDICATOR 8 (GLOVE)
GLOVE INDICATOR 8.0 STRL GRN (GLOVE) ×2 IMPLANT
GOWN STRL REUS W/ TWL LRG LVL3 (GOWN DISPOSABLE) ×1 IMPLANT
GOWN STRL REUS W/ TWL XL LVL3 (GOWN DISPOSABLE) ×1 IMPLANT
GOWN STRL REUS W/TWL LRG LVL3 (GOWN DISPOSABLE) ×1
GOWN STRL REUS W/TWL XL LVL3 (GOWN DISPOSABLE) ×1
HEAD ENDO II MOD SZ 54 (Orthopedic Implant) ×1 IMPLANT
HOOD PEEL AWAY FLYTE STAYCOOL (MISCELLANEOUS) ×4 IMPLANT
INSERT TAPER ENDO II +3 (Orthopedic Implant) ×1 IMPLANT
IV NS 100ML SINGLE PACK (IV SOLUTION) IMPLANT
LABEL OR SOLS (LABEL) ×2 IMPLANT
NDL FILTER BLUNT 18X1 1/2 (NEEDLE) ×1 IMPLANT
NDL SAFETY ECLIPSE 18X1.5 (NEEDLE) ×1 IMPLANT
NDL SPNL 20GX3.5 QUINCKE YW (NEEDLE) ×1 IMPLANT
NEEDLE FILTER BLUNT 18X 1/2SAF (NEEDLE) ×1
NEEDLE FILTER BLUNT 18X1 1/2 (NEEDLE) ×1 IMPLANT
NEEDLE HYPO 18GX1.5 SHARP (NEEDLE) ×1
NEEDLE SPNL 20GX3.5 QUINCKE YW (NEEDLE) ×2 IMPLANT
NS IRRIG 1000ML POUR BTL (IV SOLUTION) ×2 IMPLANT
PACK HIP PROSTHESIS (MISCELLANEOUS) ×2 IMPLANT
PULSAVAC PLUS IRRIG FAN TIP (DISPOSABLE) ×2
SOL .9 NS 3000ML IRR  AL (IV SOLUTION) ×1
SOL .9 NS 3000ML IRR UROMATIC (IV SOLUTION) ×2 IMPLANT
STAPLER SKIN PROX 35W (STAPLE) ×1 IMPLANT
STEM FEMORAL 14MM X 150MM ECHO (Stem) ×1 IMPLANT
STRAP SAFETY 5IN WIDE (MISCELLANEOUS) ×2 IMPLANT
SUT ETHIBOND 2 V 37 (SUTURE) ×2 IMPLANT
SUT VIC AB 1 CT1 36 (SUTURE) IMPLANT
SUT VIC AB 2-0 CT1 (SUTURE) ×4 IMPLANT
SUT VIC AB 2-0 CT1 27 (SUTURE)
SUT VIC AB 2-0 CT1 TAPERPNT 27 (SUTURE) IMPLANT
SUT VICRYL 1-0 27IN ABS (SUTURE) ×4
SUTURE VICRYL 1-0 27IN ABS (SUTURE) ×2 IMPLANT
SYR 10ML LL (SYRINGE) ×2 IMPLANT
SYR 30ML LL (SYRINGE) ×5 IMPLANT
SYR TB 1ML 27GX1/2 LL (SYRINGE) ×1 IMPLANT
TAPE TRANSPORE STRL 2 31045 (GAUZE/BANDAGES/DRESSINGS) ×1 IMPLANT
TIP BRUSH PULSAVAC PLUS 24.33 (MISCELLANEOUS) ×2 IMPLANT
TIP FAN IRRIG PULSAVAC PLUS (DISPOSABLE) ×1 IMPLANT

## 2019-12-29 NOTE — H&P (Signed)
H&P and consult reviewed and patient re-examined. No changes. 

## 2019-12-29 NOTE — Anesthesia Postprocedure Evaluation (Signed)
Anesthesia Post Note  Patient: Carlos Roach  Procedure(s) Performed: ARTHROPLASTY BIPOLAR HIP (HEMIARTHROPLASTY) (Right Hip)  Patient location during evaluation: PACU Anesthesia Type: General Level of consciousness: awake and alert Pain management: pain level controlled Vital Signs Assessment: post-procedure vital signs reviewed and stable Respiratory status: spontaneous breathing, nonlabored ventilation, respiratory function stable and patient connected to nasal cannula oxygen Cardiovascular status: blood pressure returned to baseline and stable Postop Assessment: no apparent nausea or vomiting Anesthetic complications: no   No complications documented.   Last Vitals:  Vitals:   12/29/19 1140 12/29/19 1143  BP:  94/63  Pulse: (!) 102 (!) 116  Resp: 16 13  Temp:    SpO2: 98% 97%    Last Pain:  Vitals:   12/29/19 1143  TempSrc:   PainSc: 0-No pain        RLE Motor Response: Purposeful movement (12/29/19 1143) RLE Sensation: No pain;No numbness;Full sensation (12/29/19 1143)      Cleda Mccreedy Vincent Streater

## 2019-12-29 NOTE — Progress Notes (Signed)
Initial Nutrition Assessment  RD working remotely.  DOCUMENTATION CODES:   Not applicable  INTERVENTION:  - will order Ensure Surgery BID, each supplement provides 330 kcal and 18 grams protein. - will complete NFPE at follow-up.  NUTRITION DIAGNOSIS:   Increased nutrient needs related to post-op healing as evidenced by estimated needs.  GOAL:   Patient will meet greater than or equal to 90% of their needs  MONITOR:   Diet advancement, PO intake, Supplement acceptance, Labs, Weight trends, Skin  REASON FOR ASSESSMENT:   Consult Hip fracture protocol  ASSESSMENT:   79 year old male with medical history of OSA, HTN, CHF, A. fib on Eliquis, and alcohol abuse. He presented to the ED from [facility]. He sustained a fall at home on 9/11 at which time he landed on his R side. He has been mostly bedbound since that time. Xray at ortho clinic showed R femoral neck fx. Orthopedic Surgery consulted with plan for surgery on 9/19.  He ate 100% of lunch yesterday and has been NPO since midnight. MST of 0. Patient got back to his room from PACU about 15 minutes ago.   Weight on 9/17 was 224 lb and PTA the most recently documented weight was on 12/13/18 when he weighed 252 lb. This indicates 28 lb weight loss (11% body weight) in the past 1 year; not significant for time frame but unable to determine if weight loss occurred more acutely.    Labs reviewed; Cl: 97 mmol/l, BUN: 32 mg/dl. Medications reviewed; 100 mg colace BID, 325 mg ferrous sulfate/day, 2.5 mg melatonin/night, 1 g lovaza BID, 20 mEq Klor-Con BID, 100 mg thiamine/day, 500 mcg oral cyanocobalamin/day.  IVF; NS @ 75 ml/hr.     NUTRITION - FOCUSED PHYSICAL EXAM:  unable to complete at this time.   Diet Order:   Diet Order            Diet NPO time specified Except for: Sips with Meds  Diet effective midnight                 EDUCATION NEEDS:   No education needs have been identified at this time  Skin:  Skin  Assessment: Reviewed RN Assessment  Last BM:  PTA/uknown  Height:   Ht Readings from Last 1 Encounters:  12/27/19 6' (1.829 m)    Weight:   Wt Readings from Last 1 Encounters:  12/27/19 101.6 kg     Estimated Nutritional Needs:  Kcal:  1825-2030 kcal Protein:  90-100 grams Fluid:  >/= 2 L/day     Trenton Gammon, MS, RD, LDN, CNSC Inpatient Clinical Dietitian RD pager # available in AMION  After hours/weekend pager # available in [facility]

## 2019-12-29 NOTE — Anesthesia Preprocedure Evaluation (Addendum)
Anesthesia Evaluation  Patient identified by MRN, date of birth, ID band Patient awake    Reviewed: Allergy & Precautions, H&P , NPO status , Patient's Chart, lab work & pertinent test results  History of Anesthesia Complications Negative for: history of anesthetic complications  Airway Mallampati: III  TM Distance: >3 FB Neck ROM: limited    Dental  (+) Chipped   Pulmonary neg shortness of breath, sleep apnea and Continuous Positive Airway Pressure Ventilation ,    Pulmonary exam normal        Cardiovascular Exercise Tolerance: Good hypertension, (-) angina+CHF  (-) Past MI + dysrhythmias Atrial Fibrillation  Rhythm:irregular     Neuro/Psych negative neurological ROS  negative psych ROS   GI/Hepatic negative GI ROS, Neg liver ROS, neg GERD  ,  Endo/Other  negative endocrine ROS  Renal/GU      Musculoskeletal   Abdominal   Peds  Hematology negative hematology ROS (+)   Anesthesia Other Findings Past Medical History: No date: Alcohol use No date: Arrhythmia     Comment:  atrial fibrillation No date: Cellulitis No date: Chronic atrial fibrillation (HCC)     Comment:  a. CHADS2VASc 5 (CHF, HTN, age x 2, vascular disease) No date: Chronic systolic CHF (congestive heart failure) (HCC)     Comment:  a. TTE 1/19: EF 50%, mild LVH, trivial MR/PR/TR, small               pericardial effusion No date: Chronic venous stasis No date: GI bleed No date: Hypertension No date: Obstructive sleep apnea  Past Surgical History: No date: ABDOMINAL SURGERY  BMI    Body Mass Index: 30.38 kg/m      Reproductive/Obstetrics negative OB ROS                            Anesthesia Physical Anesthesia Plan  ASA: III  Anesthesia Plan: General ETT   Post-op Pain Management:    Induction: Intravenous  PONV Risk Score and Plan: Ondansetron, Dexamethasone, Midazolam and Treatment may vary due to age  or medical condition  Airway Management Planned: Oral ETT  Additional Equipment:   Intra-op Plan:   Post-operative Plan: Extubation in OR  Informed Consent: I have reviewed the patients History and Physical, chart, labs and discussed the procedure including the risks, benefits and alternatives for the proposed anesthesia with the patient or authorized representative who has indicated his/her understanding and acceptance.   Patient has DNR.  Suspend DNR and Discussed DNR with patient.   Dental Advisory Given  Plan Discussed with: Anesthesiologist, CRNA and Surgeon  Anesthesia Plan Comments: (Patient reports that it has been less than 72 hours since his last dose of Eliquis   Patient consented for risks of anesthesia including but not limited to:  - adverse reactions to medications - damage to eyes, teeth, lips or other oral mucosa - nerve damage due to positioning  - sore throat or hoarseness - Damage to heart, brain, nerves, lungs, other parts of body or loss of life  Patient voiced understanding.)       Anesthesia Quick Evaluation

## 2019-12-29 NOTE — Transfer of Care (Signed)
Immediate Anesthesia Transfer of Care Note  Patient: Carlos Roach  Procedure(s) Performed: ARTHROPLASTY BIPOLAR HIP (HEMIARTHROPLASTY) (Right Hip)  Patient Location: PACU  Anesthesia Type:General  Level of Consciousness: awake, alert  and oriented  Airway & Oxygen Therapy: Patient Spontanous Breathing  Post-op Assessment: Report given to RN and Post -op Vital signs reviewed and stable  Post vital signs: Reviewed and stable  Last Vitals:  Vitals Value Taken Time  BP 94/62 12/29/19 1101  Temp    Pulse 93 12/29/19 1105  Resp 15 12/29/19 1105  SpO2 91 % 12/29/19 1105  Vitals shown include unvalidated device data.  Last Pain:  Vitals:   12/29/19 1057  TempSrc:   PainSc: 0-No pain      Patients Stated Pain Goal: 0 (12/28/19 0526)  Complications: No complications documented.

## 2019-12-29 NOTE — Anesthesia Procedure Notes (Signed)
Procedure Name: Intubation Date/Time: 12/29/2019 8:52 AM Performed by: Andria Frames, MD Pre-anesthesia Checklist: Patient identified, Patient being monitored, Timeout performed, Emergency Drugs available and Suction available Patient Re-evaluated:Patient Re-evaluated prior to induction Oxygen Delivery Method: Circle system utilized Preoxygenation: Pre-oxygenation with 100% oxygen Induction Type: IV induction Ventilation: Mask ventilation without difficulty Laryngoscope Size: Mac, 3 and McGraph Grade View: Grade I Tube type: Oral Tube size: 7.5 mm Number of attempts: 1 Airway Equipment and Method: Video-laryngoscopy Placement Confirmation: ETT inserted through vocal cords under direct vision,  positive ETCO2,  breath sounds checked- equal and bilateral and CO2 detector Secured at: 21 cm Tube secured with: Tape Dental Injury: Teeth and Oropharynx as per pre-operative assessment

## 2019-12-29 NOTE — Progress Notes (Signed)
PROGRESS NOTE    Carlos Roach   ZOX:[id number]  DOB: 09/11/1940  DOA: 12/27/2019     2  PCP: Jannetta Quint  CC: Larey Seat at home onto right side  Hospital Course: Mr. Carlos Roach is a 79 year old Caucasian male with PMH OSA, hypertension, chronic systolic CHF, A. fib on Eliquis, alcohol use who presented to the ER after referral from [facility].  He had a mechanical fall at home on 12/21/2019 landing on his right side. He was trying to recover at home but was mostly bedbound and only getting to the chair or bathroom by pivoting and not putting much weight on his right lower extremity. He had an x-ray at the orthopedic clinic which revealed a right femoral neck fracture and was sent to the ER. Repeat x-ray performed on admission confirmed a comminuted displaced subcapital right hip fracture. Orthopedic surgery evaluated the patient and after 48 hours of holding Eliquis, he is planning to undergo repair.  Right hip hemiarthroplasty was performed on 12/29/19.    Interval History:  No events overnight. Underwent right hip repair today in the OR. Seen in room after surgery resting comfortably. Ice pack on right thigh, no pain at this time. Overall comfortable and denied any complaints.   Old records reviewed in assessment of this patient  ROS: Constitutional: negative for chills and fevers, Respiratory: negative for cough, Cardiovascular: negative for chest pain and Gastrointestinal: negative for abdominal pain  Assessment & Plan: Alcohol use -Monitor for any signs of withdrawal but has been pleasant with no concerning symptoms at this time  Atrial fibrillation (HCC) -Continue holding Eliquis; likely resume on 9/20 - continue cardizem  Closed displaced fracture of right femoral neck (HCC) - s/p mechanical fall at home on 9/11; has not been able to ambulate and ultimately presented on 12/27/2019 for evaluation and was found to have fracture as noted -Orthopedic surgery following,  appreciate assistance.  - s/p right hip hemiarthroplasty 12/29/19 -Continue pain control  HTN (hypertension) - continue cardizem  Chronic cutaneous venous stasis ulcer (HCC) -Follows with outpatient wound center. Last note reviewed from 11/29/2019 -Continue bilateral Unna boots -inpatient consult to wound care while hospitalized    Antimicrobials: Ancef 9/19>>present  DVT prophylaxis: HSQ prior to sx; likely resuming Eliquis Monday Code Status: DNR Family Communication: none present Disposition Plan: Status is: Inpatient  Remains inpatient appropriate because:Unsafe d/c plan, IV treatments appropriate due to intensity of illness or inability to take PO and Inpatient level of care appropriate due to severity of illness   Dispo: The patient is from: Home              Anticipated d/c is to: pending PT eval after surgery              Anticipated d/c date is: 3 days              Patient currently is not medically stable to d/c.   Objective: Blood pressure 102/74, pulse (!) 107, temperature 97.8 F (36.6 C), temperature source Oral, resp. rate 16, height 6' (1.829 m), weight 101.6 kg, SpO2 95 %.  Examination: General appearance: Pleasant elderly man lying in bed in no distress Head: Normocephalic, without obvious abnormality, atraumatic Eyes: EOMI Lungs: clear to auscultation bilaterally Heart: irregularly irregular rhythm and S1, S2 normal Abdomen: normal findings: bowel sounds normal and soft, non-tender Extremities: Right sugical dressing in place with soft thigh, no TTP at this time. Bilateral unna boots noted; exposed feet are noted to have 3+ edema worse  on the right Skin: Chronic stasis changes noted in bilateral lower extremities Neurologic: no obvious deficits; RLE spared from exam but sensation intact   Consultants:   Ortho  Procedures:   none  Data Reviewed: I have personally reviewed following labs and imaging studies Results for orders placed or performed  during the hospital encounter of 12/27/19 (from the past 24 hour(s))  Basic metabolic panel     Status: Abnormal   Collection Time: 12/29/19  4:26 AM  Result Value Ref Range   Sodium 136 135 - 145 mmol/L   Potassium 4.3 3.5 - 5.1 mmol/L   Chloride 97 (L) 98 - 111 mmol/L   CO2 31 22 - 32 mmol/L   Glucose, Bld 106 (H) 70 - 99 mg/dL   BUN 32 (H) 8 - 23 mg/dL   Creatinine, Ser 0.98 0.61 - 1.24 mg/dL   Calcium 9.2 8.9 - 11.9 mg/dL   GFR calc non Af Amer >60 >60 mL/min   GFR calc Af Amer >60 >60 mL/min   Anion gap 8 5 - 15  CBC with Differential/Platelet     Status: Abnormal   Collection Time: 12/29/19  4:26 AM  Result Value Ref Range   WBC 7.2 4.0 - 10.5 K/uL   RBC 4.36 4.22 - 5.81 MIL/uL   Hemoglobin 12.5 (L) 13.0 - 17.0 g/dL   HCT 14.7 (L) 39 - 52 %   MCV 89.0 80.0 - 100.0 fL   MCH 28.7 26.0 - 34.0 pg   MCHC 32.2 30.0 - 36.0 g/dL   RDW 82.9 56.2 - 13.0 %   Platelets 251 150 - 400 K/uL   nRBC 0.0 0.0 - 0.2 %   Neutrophils Relative % 70 %   Neutro Abs 5.1 1.7 - 7.7 K/uL   Lymphocytes Relative 14 %   Lymphs Abs 1.0 0.7 - 4.0 K/uL   Monocytes Relative 10 %   Monocytes Absolute 0.7 0 - 1 K/uL   Eosinophils Relative 4 %   Eosinophils Absolute 0.3 0 - 0 K/uL   Basophils Relative 1 %   Basophils Absolute 0.1 0 - 0 K/uL   Immature Granulocytes 1 %   Abs Immature Granulocytes 0.04 0.00 - 0.07 K/uL  Magnesium     Status: None   Collection Time: 12/29/19  4:26 AM  Result Value Ref Range   Magnesium 2.2 1.7 - 2.4 mg/dL    Recent Results (from the past 240 hour(s))  SARS Coronavirus 2 by RT PCR (hospital order, performed in [facility] hospital lab) Nasopharyngeal Nasopharyngeal Swab     Status: None   Collection Time: 12/27/19  4:10 PM   Specimen: Nasopharyngeal Swab  Result Value Ref Range Status   SARS Coronavirus 2 NEGATIVE NEGATIVE Final    Comment: (NOTE) SARS-CoV-2 target nucleic acids are NOT DETECTED.  The SARS-CoV-2 RNA is generally detectable in upper and  lower respiratory specimens during the acute phase of infection. The lowest concentration of SARS-CoV-2 viral copies this assay can detect is 250 copies / mL. A negative result does not preclude SARS-CoV-2 infection and should not be used as the sole basis for treatment or other patient management decisions.  A negative result may occur with improper specimen collection / handling, submission of specimen other than nasopharyngeal swab, presence of viral mutation(s) within the areas targeted by this assay, and inadequate number of viral copies (<250 copies / mL). A negative result must be combined with clinical observations, patient history, and epidemiological information.  Fact Sheet for  Patients:   BoilerBrush.com.cyhttps://www.fda.gov/media/136312/download  Fact Sheet for Healthcare Providers: https://pope.com/https://www.fda.gov/media/136313/download  This test is not yet approved or  cleared by the [place] FDA and has been authorized for detection and/or diagnosis of SARS-CoV-2 by FDA under an Emergency Use Authorization (EUA).  This EUA will remain in effect (meaning this test can be used) for the duration of the COVID-19 declaration under Section 564(b)(1) of the Act, 21 U.S.C. section 360bbb-3(b)(1), unless the authorization is terminated or revoked sooner.  Performed at [facility], [address] [place], [place] [address]      Radiology Studies: DG Chest 1 View  Result Date: 12/27/2019 CLINICAL DATA:  Right hip fracture. EXAM: CHEST  1 VIEW COMPARISON:  December 13, 2018 FINDINGS: Opacity in left retrocardiac region is unchanged since December 13, 2018. There is suggestion of a small left pleural effusion versus pleural thickening. Stable cardiomegaly. No other changes. IMPRESSION: Persistent opacity and small effusion in the left base, unchanged since December 13, 2018. No acute interval changes. Electronically Signed   By: Gerome Samavid  Williams III M.D   On: 12/27/2019 17:24   DG HIP UNILAT  W OR W/O PELVIS 2-3 VIEWS RIGHT  Result Date: 12/29/2019 CLINICAL DATA:  Right hip arthroplasty. EXAM: DG HIP (WITH OR WITHOUT PELVIS) 2-3V RIGHT COMPARISON:  12/27/2019 FINDINGS: Expected changes post right hip arthroplasty with prosthesis intact and normally located. Remainder of the exam is unchanged. IMPRESSION: Expected changes post right hip arthroplasty. Electronically Signed   By: Elberta Fortisaniel  Boyle M.D.   On: 12/29/2019 11:48   DG Hip Unilat With Pelvis 2-3 Views Right  Result Date: 12/27/2019 CLINICAL DATA:  Fall 6 days ago. EXAM: DG HIP (WITH OR WITHOUT PELVIS) 2-3V RIGHT COMPARISON:  None FINDINGS: There is a comminuted displaced subcapital right hip fracture. No dislocation of the femoral head is noted. The femoral head appears to be rotated within the acetabulum. No other bony abnormalities are noted. IMPRESSION: Comminuted displaced subcapital right hip fracture with no identified dislocation. The femoral head does appear to be rotated within the acetabulum. Electronically Signed   By: Gerome Samavid  Williams III M.D   On: 12/27/2019 17:23   DG HIP UNILAT W OR W/O PELVIS 2-3 VIEWS RIGHT  Final Result    DG Hip Unilat With Pelvis 2-3 Views Right  Final Result    DG Chest 1 View  Final Result      Scheduled Meds: . acetaminophen  1,000 mg Oral Q6H  . [START ON 12/30/2019] apixaban  5 mg Oral BID  . diltiazem  120 mg Oral Daily  . docusate sodium  100 mg Oral BID  . feeding supplement  237 mL Oral BID BM  . ferrous sulfate  325 mg Oral BID WC  . folic acid  500 mcg Oral Daily  . melatonin  2.5 mg Oral QHS  . metoprolol succinate  50 mg Oral Daily  . multivitamin with minerals  1 tablet Oral Daily  . omega-3 acid ethyl esters  1 g Oral BID  . potassium chloride SA  20 mEq Oral BID  . simvastatin  40 mg Oral Daily  . tamsulosin  0.4 mg Oral QPC breakfast  . thiamine  100 mg Oral Daily  . torsemide  20 mg Oral BID  . torsemide  60 mg Oral Daily  . traMADol  50 mg Oral Q6H  . vitamin  B-12  500 mcg Oral Daily   PRN Meds: acetaminophen, bisacodyl, diphenhydrAMINE, magnesium hydroxide, methocarbamol **OR** methocarbamol (ROBAXIN) IV, metoCLOPramide **  OR** metoCLOPramide (REGLAN) injection, morphine injection, ondansetron **OR** ondansetron (ZOFRAN) IV, oxyCODONE, polyethylene glycol, sodium phosphate Continuous Infusions: . sodium chloride 75 mL/hr at 12/29/19 1440  .  ceFAZolin (ANCEF) IV    . methocarbamol (ROBAXIN) IV        LOS: 2 days  Time spent: Greater than 50% of the 35 minute visit was spent in counseling/coordination of care for the patient as laid out in the A&P.   Lewie Chamber, MD [facility] 12/29/2019, 4:22 PM  Contact via secure chat.  To contact the attending provider between 7A-7P or the covering provider during after hours 7P-7A, please log into the web site [email or web address] and access using universal [place] password for that web site. If you do not have the password, please call the hospital operator.

## 2019-12-29 NOTE — Op Note (Signed)
12/27/2019 - 12/29/2019  11:10 AM  Patient:   Carlos Roach  Pre-Op Diagnosis:   Displaced femoral neck fracture, right hip.  Post-Op Diagnosis:   Same.  Procedure:   Right hip unipolar hemiarthroplasty.  Surgeon:   Maryagnes Amos, MD  Assistant:   Dedra Skeens, PA-C  Anesthesia:   GET  Findings:   As above.  Complications:   None  EBL:   250 cc  Fluids:   1200 cc crystalloid  UOP:   None  TT:   None  Drains:   None  Closure:   Staples  Implants:   Biomet press-fit system with a #14 laterally offset reduced proximal profile Echo femoral stem, a 54 mm outer diameter shell, and a +3 mm neck adapter.  Brief Clinical Note:   The patient is a 79 year old male who sustained the above-noted injury 9 days ago when he apparently tripped was pulled down while walking a dog with a walker, injuring his right hip. Initially, he did not feel it was severe enough to warrant a visit to the emergency room. He tried to see if it would get better on his own for the next 5 or 6 days. When it was clear that the hip was not getting better and that he could still not bear any weight on it, he presented to a local urgent care facility. X-rays there demonstrated a displaced femoral neck fracture so he was referred to the emergency room and subsequently admitted for definitive management of the injury.  Procedure:   The patient was brought into the operating room. After adequate spinal anesthesia was obtained, the patient was repositioned in the left lateral decubitus position and secured using a lateral hip positioner. The right hip and lower extremity were prepped with ChloroPrep solution before being draped sterilely. Preoperative antibiotics were administered. A timeout was performed to verify the appropriate surgical site before a standard posterior approach to the hip was made through an approximately 6-7 inch incision. The incision was carried down through the subcutaneous tissues to expose the  gluteal fascia and proximal end of the iliotibial band. These structures were split the length of the incision and the Charnley self-retaining hip retractor placed. The bursal tissues were swept posteriorly to expose the short external rotators. The anterior border of the piriformis tendon was identified and this plane developed down through the capsule to enter the joint. Abundant fracture hematoma was suctioned. A flap of tissue was elevated off the posterior aspect of the femoral neck and greater trochanter and retracted posteriorly. This flap included the piriformis tendon, the short external rotators, and the posterior capsule. The femoral head was removed in its entirety, then taken to the back table where it was measured and found to be optimally replicated by a 54 mm head. The appropriate trial head was inserted and found to demonstrate an excellent suction fit.   Attention was directed to the femoral side. The femoral neck was recut 10-12 mm above the lesser trochanter using an oscillating saw. The piriformis fossa was debrided of soft tissues before the intramedullary canal was accessed through this point using a triple step reamer. The canal was reamed sequentially beginning with a #7 tapered reamer and progressing to a #14 tapered reamer. This provided excellent circumferential chatter. A box osteotome was used to establish version before the canal was broached sequentially beginning with a #12 broach and progressing to a #14 broach. This was left in place and several trial reductions performed. The permanent #14  laterally offset reduced proximal profile femoral stem was impacted into place. A repeat trial reduction was performed using both the +0 mm and +3 mm neck lengths. The +3 mm neck length demonstrated excellent stability both in extension and external rotation as well as with flexion to 90 and internal rotation beyond 70. It also was stable in the position of sleep. The 54 mm outer diameter  shell with the +3 mm neck adapter construct was put together on the back table before being impacted onto the stem of the femoral component. The Morse taper locking mechanism was verified using manual distraction before the head was relocated and the hip placed through a range of motion with the findings as described above.  The wound was copiously irrigated with bacitracin saline solution via the jet lavage system before the peri-incisional and pericapsular tissues were injected with 30 cc of 0.5% Sensorcaine with epinephrine and 20 cc of Exparel diluted out to 60 cc with normal saline to help with postoperative analgesia. The posterior flap was reapproximated to the posterior aspect of the greater trochanter using #2 Ethibond interrupted sutures placed through drill holes. The iliotibial band was reapproximated using #1 Vicryl interrupted sutures before the gluteal fascia was closed using a running #1 Vicryl suture. At this point, 1 g of transexemic acid in 10 cc of normal saline was injected into the joint to help reduce postoperative bleeding. The subcutaneous tissues were closed in several layers using 2-0 Vicryl interrupted sutures before the skin was closed using staples. A sterile occlusive dressing was applied to the wound . The patient then was rolled back into the supine position on the hospital bed before being awakened, extubated, and returned to the recovery room in satisfactory condition after tolerating the procedure well.

## 2019-12-30 ENCOUNTER — Encounter: Payer: Self-pay | Admitting: Surgery

## 2019-12-30 LAB — BASIC METABOLIC PANEL
Anion gap: 10 (ref 5–15)
BUN: 35 mg/dL — ABNORMAL HIGH (ref 8–23)
CO2: 29 mmol/L (ref 22–32)
Calcium: 8.9 mg/dL (ref 8.9–10.3)
Chloride: 97 mmol/L — ABNORMAL LOW (ref 98–111)
Creatinine, Ser: 1.15 mg/dL (ref 0.61–1.24)
GFR calc Af Amer: >60 mL/min (ref >60)
GFR calc non Af Amer: >60 mL/min (ref >60)
Glucose, Bld: 139 mg/dL — ABNORMAL HIGH (ref 70–99)
Potassium: 4.1 mmol/L (ref 3.5–5.1)
Sodium: 136 mmol/L (ref 135–145)

## 2019-12-30 LAB — CBC WITH DIFFERENTIAL/PLATELET
Abs Immature Granulocytes: 0.07 10*3/uL (ref 0.00–0.07)
Basophils Absolute: 0 10*3/uL (ref 0.0–0.1)
Basophils Relative: 0 %
Eosinophils Absolute: 0 10*3/uL (ref 0.0–0.5)
Eosinophils Relative: 0 %
HCT: 33.9 % — ABNORMAL LOW (ref 39.0–52.0)
Hemoglobin: 11.6 g/dL — ABNORMAL LOW (ref 13.0–17.0)
Immature Granulocytes: 1 %
Lymphocytes Relative: 7 %
Lymphs Abs: 0.7 10*3/uL (ref 0.7–4.0)
MCH: 29.4 pg (ref 26.0–34.0)
MCHC: 34.2 g/dL (ref 30.0–36.0)
MCV: 86 fL (ref 80.0–100.0)
Monocytes Absolute: 0.6 10*3/uL (ref 0.1–1.0)
Monocytes Relative: 6 %
Neutro Abs: 9.2 10*3/uL — ABNORMAL HIGH (ref 1.7–7.7)
Neutrophils Relative %: 86 %
Platelets: 245 10*3/uL (ref 150–400)
RBC: 3.94 MIL/uL — ABNORMAL LOW (ref 4.22–5.81)
RDW: 14 % (ref 11.5–15.5)
WBC: 10.7 10*3/uL — ABNORMAL HIGH (ref 4.0–10.5)
nRBC: 0 % (ref 0.0–0.2)

## 2019-12-30 LAB — MAGNESIUM: Magnesium: 2.1 mg/dL (ref 1.7–2.4)

## 2019-12-30 MED ORDER — INFLUENZA VAC A&B SA ADJ QUAD 0.5 ML IM PRSY
0.5000 mL | PREFILLED_SYRINGE | INTRAMUSCULAR | Status: AC
  Administered 2019-12-31: 0.5 mL via INTRAMUSCULAR
  Filled 2019-12-30 (×2): qty 0.5

## 2019-12-30 MED ORDER — HYDROCERIN EX CREA
TOPICAL_CREAM | Freq: Every day | CUTANEOUS | Status: DC
  Filled 2019-12-30: qty 113

## 2019-12-30 NOTE — Evaluation (Signed)
Physical Therapy Evaluation Patient Details Name: Carlos Roach MRN: [id number] DOB: 01-08-1941 Today's Date: 12/30/2019   History of Present Illness  Pt is a 79 yo male s/p R hip hemiarthroplasty due to a fall at home on 12/21/2019. PMH of HTN, CHF, afib, etoh abuse.  Clinical Impression  Pt alert, oriented, agreeable to PT. Denied pain at rest or with mobility, reported fear was his main limiting factor. At baseline pt lives alone, modI/I for ADLs.   The patient was able to perform several supine exercises, physical assist needed for RLE. Supine to sit with heavy use of bed rails and minA. Fair sitting balance once pt positioned in midline. Sit <> stand from elevated EOB with RW and modA, able to take several steps to recliner in room, modA throughout for cueing, physical support and unsteadiness.  Overall the patient demonstrated deficits (see "PT Problem List") that impede the patient's functional abilities, safety, and mobility and would benefit from skilled PT intervention. Recommendation is SNF due to acute decline in functional status and decreased caregiver support.    Follow Up Recommendations SNF    Equipment Recommendations  None recommended by PT;Other (comment) (pt has RW and rollator at home)    Recommendations for Other Services OT consult     Precautions / Restrictions Precautions Precautions: Fall Restrictions Weight Bearing Restrictions: Yes RLE Weight Bearing: Weight bearing as tolerated      Mobility  Bed Mobility Overal bed mobility: Needs Assistance Bed Mobility: Supine to Sit     Supine to sit: Min assist;HOB elevated     General bed mobility comments: heavy use of bed rails  Transfers Overall transfer level: Needs assistance Equipment used: Rolling walker (2 wheeled) Transfers: Sit to/from [employer] Sit to Stand: Mod assist Stand pivot transfers: Mod assist       General transfer comment: pt with poor weight bearing noted  on RLE. difficulty with TKE. Cued to improve UE use of RW with poor carry over.  Ambulation/Gait                Stairs            Wheelchair Mobility    Modified Rankin (Stroke Patients Only)       Balance Overall balance assessment: Needs assistance Sitting-balance support: Feet supported Sitting balance-Leahy Scale: Fair       Standing balance-Leahy Scale: Poor                               Pertinent Vitals/Pain Pain Assessment: No/denies pain    Home Living Family/patient expects to be discharged to:: Private residence Living Arrangements: Alone Available Help at Discharge: Family Type of Home: Apartment Home Access: Level entry     Home Layout: One level Home Equipment: Environmental consultant - 2 wheels;Walker - 4 wheels;Toilet riser;Grab bars - tub/shower;Grab bars - toilet Additional Comments: 1 fall about 4 months ago besides this fall that led to this admission.    Prior Function           Comments: Pt normally ambulatory at baseline without assistive device     Hand Dominance   Dominant Hand: Right    Extremity/Trunk Assessment   Upper Extremity Assessment Upper Extremity Assessment: Generalized weakness    Lower Extremity Assessment Lower Extremity Assessment: Generalized weakness;RLE deficits/detail;LLE deficits/detail RLE Deficits / Details: unable to lift against gravity without assistance s/p hemiarthroplasty LLE Deficits / Details: grossly 4/5  Communication   Communication: No difficulties;Other (comment) (blind in R eye)  Cognition Arousal/Alertness: Awake/alert Behavior During Therapy: WFL for tasks assessed/performed Overall Cognitive Status: Within Functional Limits for tasks assessed                                 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      General Comments      Exercises General Exercises - Lower Extremity Ankle Circles/Pumps: AROM;Both;10 reps Quad Sets:  AROM;Strengthening;Both;10 reps Heel Slides: AAROM;Strengthening;Right;10 reps Straight Leg Raises: AAROM;Strengthening;Right;10 reps   Assessment/Plan    PT Assessment Patient needs continued PT services  PT Problem List Decreased strength;Decreased mobility;Decreased range of motion;Decreased knowledge of precautions;Decreased activity tolerance;Decreased balance;Decreased knowledge of use of DME;Pain       PT Treatment Interventions DME instruction;Therapeutic exercise;Gait training;Balance training;Stair training;Neuromuscular re-education;Functional mobility training;Patient/family education;Therapeutic activities    PT Goals (Current goals can be found in the Care Plan section)  Acute Rehab PT Goals Patient Stated Goal: to get  better PT Goal Formulation: With patient Time For Goal Achievement: 01/13/20 Potential to Achieve Goals: Good    Frequency BID   Barriers to discharge Decreased caregiver support      Co-evaluation               AM-PAC PT "6 Clicks" Mobility  Outcome Measure Help needed turning from your back to your side while in a flat bed without using bedrails?: A Lot Help needed moving from lying on your back to sitting on the side of a flat bed without using bedrails?: A Lot Help needed moving to and from a bed to a chair (including a wheelchair)?: A Lot Help needed standing up from a chair using your arms (e.g., wheelchair or bedside chair)?: A Lot Help needed to walk in hospital room?: A Lot Help needed climbing 3-5 steps with a railing? : Total 6 Click Score: 11    End of Session Equipment Utilized During Treatment: Gait belt Activity Tolerance: Patient tolerated treatment well;Patient limited by fatigue Patient left: in chair;with chair alarm set;with call bell/phone within reach Nurse Communication: Mobility status PT Visit Diagnosis: Muscle weakness (generalized) (M62.81);Difficulty in walking, not elsewhere classified (R26.2);History of  falling (Z91.81);Pain Pain - Right/Left: Right Pain - part of body: Hip    Time: 5009-3818 PT Time Calculation (min) (ACUTE ONLY): 35 min   Charges:   PT Evaluation $PT Eval Low Complexity: 1 Low PT Treatments $Therapeutic Exercise: 23-37 mins       Olga Coaster PT, DPT 11:02 AM,12/30/19

## 2019-12-30 NOTE — Progress Notes (Signed)
  Subjective: 1 Day Post-Op Procedure(s) (LRB): ARTHROPLASTY BIPOLAR HIP (HEMIARTHROPLASTY) (Right) Patient reports pain as moderate.   Patient is well, and has had no acute complaints or problems Plan is to go Rehab after hospital stay. Negative for chest pain and shortness of breath Fever: no Gastrointestinal: Negative for nausea and vomiting  Objective: Vital signs in last 24 hours: Temp:  [96.8 F (36 C)-98.1 F (36.7 C)] 97.9 F (36.6 C) (09/20 0353) Pulse Rate:  [73-116] 73 (09/20 0353) Resp:  [13-18] 18 (09/20 0353) BP: (90-104)/(56-77) 90/62 (09/20 0353) SpO2:  [91 %-98 %] 95 % (09/20 0353)  Intake/Output from previous day:  Intake/Output Summary (Last 24 hours) at 12/30/2019 0717 Last data filed at 12/30/2019 0355 Gross per 24 hour  Intake 1500 ml  Output 2800 ml  Net -1300 ml    Intake/Output this shift: No intake/output data recorded.  Labs: Recent Labs    12/27/19 1610 12/28/19 0525 12/29/19 0426 12/30/19 0534  HGB 13.7 12.4* 12.5* 11.6*   Recent Labs    12/29/19 0426 12/30/19 0534  WBC 7.2 10.7*  RBC 4.36 3.94*  HCT 38.8* 33.9*  PLT 251 245   Recent Labs    12/29/19 0426 12/30/19 0534  NA 136 136  K 4.3 4.1  CL 97* 97*  CO2 31 29  BUN 32* 35*  CREATININE 0.95 1.15  GLUCOSE 106* 139*  CALCIUM 9.2 8.9   Recent Labs    12/27/19 1610  INR 1.3*     EXAM General - Patient is Alert and Oriented Extremity - Neurovascular intact Sensation intact distally Dorsiflexion/Plantar flexion intact Dressing/Incision - clean, dry, no drainage Motor Function - intact, moving foot and toes well on exam.   Past Medical History:  Diagnosis Date  . Alcohol use   . Arrhythmia    atrial fibrillation  . Cellulitis   . Chronic atrial fibrillation (HCC)    a. CHADS2VASc 5 (CHF, HTN, age x 2, vascular disease)  . Chronic systolic CHF (congestive heart failure) (HCC)    a. TTE 1/19: EF 50%, mild LVH, trivial MR/PR/TR, small pericardial effusion  .  Chronic venous stasis   . GI bleed   . Hypertension   . Obstructive sleep apnea     Assessment/Plan: 1 Day Post-Op Procedure(s) (LRB): ARTHROPLASTY BIPOLAR HIP (HEMIARTHROPLASTY) (Right) Principal Problem:   Closed displaced fracture of right femoral neck (HCC) Active Problems:   HTN (hypertension)   Atrial fibrillation (HCC)   Alcohol use   Fall   Chronic cutaneous venous stasis ulcer (HCC)  Estimated body mass index is 30.38 kg/m as calculated from the following:   Height as of this encounter: 6' (1.829 m).   Weight as of this encounter: 101.6 kg. Advance diet Up with therapy D/C IV fluids  Discharge to rehab by medicine. Follow-up at [facility] clinic orthopedics in 2 weeks for staple removal   DVT Prophylaxis - TED hose and Eliquis Weight-Bearing as tolerated to right leg  Carlos Skeens, PA-C Orthopaedic Surgery 12/30/2019, 7:17 AM

## 2019-12-30 NOTE — Consult Note (Signed)
WOC Nurse Consult Note: Reason for Consult: Consult received for evaluation of legs beneath Unna's Boots.  All wounds are healed except the right lateral knee. Two wounds on right LE and left LE from Unna's boots (device related skin injury). Wound type: venous insufficiency (healed) and device related skin injury (Stage 2, healing) Pressure Injury POA: N/A Measurement: Right knee (lateral):  1cm x 1.2cm x 0.1cm Right foot: 0.4cm x 3cm with no depth (dried serum) Left knee:  Two areas recently reepithelialized. Wound bed: pink, moist at right knee.  Healing with dried serum at bend in right foot.  Newly reepithelialized at pretibial area on left LE (just below knee). Drainage (amount, consistency, odor)  Periwound: Dressing procedure/placement/frequency: While in house, LEs will be elevated except for the short periods of time patient is ambulating with physiotherapy. The bilateral LEs are without wounds in the areas previously noted, two wounds related to the Unna's Boots (at the right knee just above the boot and the bend in the right foot) are partial thickness and healing and would benefit from leaving the boots off for the time being for daily moisturizing, topical care and the application of mild compression via ACE bandages. Once patient is up and ambulating more often, he can return to the outpatient [facility] for consideration of compression hosiery vs continued application of Unna's Boots. I have provided Nursing with guidance for the daily care of the LEs: cleansing, moisturizing (with Eucerin) and dressing with xeroform gauze topped with dry gauze prior to rapping from toes to knee with Kerlix roll gauze topped with ACE bandage.  Heels are to be floated and LEs elevated.  Patient to follow up with outpatient [facility] when discharged.  WOC nursing team will not follow, but will remain available to this patient, the nursing and medical teams.  Please re-consult if needed. Thanks, Ladona Mow,  MSN, RN, GNP, [place]  Pager# [phone]

## 2019-12-30 NOTE — Evaluation (Signed)
Occupational Therapy Evaluation Patient Details Name: Carlos Roach MRN: [id number] DOB: [birth year] Today's Date: 12/30/2019    History of Present Illness Pt is a 79 yo male s/p R hip hemiarthroplasty due to a fall at home on 12/21/2019. PMH of HTN, CHF, afib, etoh abuse.   Clinical Impression   Patient presenting with decreased I in self care, balance, functional transfers/mobility, balance, strength, and endurance.  Patient reports being independent PTA and living at home with wife. Patient currently functioning at mod A for functional transfers and sit <>stand with max A for toileting needs and LB self care tasks. Patient will benefit from acute OT to increase overall independence in the areas of ADLs, functional mobility, and safety awareness in order to safely discharge to next venue of care.     Follow Up Recommendations  SNF    Equipment Recommendations  Other (comment) (defer to next venue of care)    Recommendations for Other Services Other (comment) (none at this time)     Precautions / Restrictions Precautions Precautions: Fall Restrictions Weight Bearing Restrictions: Yes RLE Weight Bearing: Weight bearing as tolerated      Mobility Bed Mobility Overal bed mobility: Needs Assistance Bed Mobility: Supine to Sit     Supine to sit: Min assist;HOB elevated     General bed mobility comments: heavy use of bed rails  Transfers Overall transfer level: Needs assistance Equipment used: Rolling walker (2 wheeled) Transfers: Sit to/from [employer] Sit to Stand: Mod assist Stand pivot transfers: Mod assist       General transfer comment: Pt not bearing full weight on R LE and heavy use of UEs for support on RW    Balance Overall balance assessment: Needs assistance Sitting-balance support: Feet supported Sitting balance-Leahy Scale: Fair       Standing balance-Leahy Scale: Poor        ADL either performed or assessed with clinical  judgement   ADL Overall ADL's : Needs assistance/impaired     Grooming: Wash/dry hands;Wash/dry face;Oral care;Sitting;Set up        Toilet Transfer: RW;Moderate assistance   Toileting- Clothing Manipulation and Hygiene: Maximal assistance;Sit to/from stand         General ADL Comments: Pt reports urgency for BM and transferred onto [facility] with RW and mod A. Pt needing assistance with clothing management and hygiene.     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)   Cognition Arousal/Alertness: Awake/alert Behavior During Therapy: WFL for tasks assessed/performed Overall Cognitive Status: Within Functional Limits for tasks assessed    General Comments: slow processing              Home Living Family/patient expects to be discharged to:: Private residence Living Arrangements: Alone Available Help at Discharge: Family Type of Home: Apartment Home Access: Level entry     Home Layout: One level     Bathroom Shower/Tub: Chief Strategy Officer: Standard     Home Equipment: Environmental consultant - 2 wheels;Walker - 4 wheels;Toilet riser;Grab bars - tub/shower;Grab bars - toilet  Additional Comments: 1 fall about 4 months ago besides this fall that led to this admission.      Prior Functioning/Environment     Comments: Pt normally ambulatory at baseline without assistive device        OT Problem List: Decreased strength;Decreased activity tolerance;Decreased safety awareness;Impaired balance (sitting and/or  standing);Decreased knowledge of use of DME or AE      OT Treatment/Interventions: Self-care/ADL training;Therapeutic exercise;Therapeutic activities;Energy conservation;DME and/or AE instruction;Patient/family education;Balance training    OT Goals(Current goals can be found in the care plan section) Acute Rehab OT Goals Patient Stated Goal: to get  better OT Goal Formulation: With patient Time For Goal Achievement: 01/13/20 Potential to Achieve Goals: Good ADL Goals Pt Will Perform Grooming: standing;with supervision Pt Will Perform Lower Body Dressing: with supervision;with adaptive equipment;sit to/from stand Pt Will Transfer to Toilet: with supervision;ambulating Pt Will Perform Toileting - Clothing Manipulation and hygiene: with supervision;sit to/from stand  OT Frequency: Min 1X/week   Barriers to D/C:    none at this time          AM-PAC OT "6 Clicks" Daily Activity     Outcome Measure Help from another person eating meals?: None Help from another person taking care of personal grooming?: A Little Help from another person toileting, which includes using toliet, bedpan, or urinal?: A Lot Help from another person bathing (including washing, rinsing, drying)?: A Lot Help from another person to put on and taking off regular upper body clothing?: A Little Help from another person to put on and taking off regular lower body clothing?: A Lot 6 Click Score: 16   End of Session Equipment Utilized During Treatment: Rolling walker;Other (comment) [facility] Nurse Communication: Mobility status  Activity Tolerance: Patient tolerated treatment well Patient left: in bed;with call bell/phone within reach;with bed alarm set  OT Visit Diagnosis: Repeated falls (R29.6);Muscle weakness (generalized) (M62.81);History of falling (Z91.81)                Time: 1355-1419 OT Time Calculation (min): 24 min Charges:  OT General Charges $OT Visit: 1 Visit OT Evaluation $OT Eval Low Complexity: 1  Low OT Treatments $Self Care/Home Management : 8-22 mins  Jackquline Denmark, MS, OTR/L , CBIS ascom [phone]  12/30/19, 3:08 PM

## 2019-12-30 NOTE — Care Management Important Message (Signed)
Important Message  Patient Details  Name: Carlos Roach MRN: [id number] Date of Birth: [birth year]   Medicare Important Message Given:  Yes     [name] 12/30/2019, 10:56 AM

## 2019-12-30 NOTE — Progress Notes (Signed)
PROGRESS NOTE    Carlos Roach   ZOX:[id number]  DOB: 20-Sep-1940  DOA: 12/27/2019     3  PCP: Jannetta Quint  CC: Larey Seat at home onto right side  Hospital Course: Carlos Roach is a 79 year old Caucasian male with PMH OSA, hypertension, chronic systolic CHF, A. fib on Eliquis, alcohol use who presented to the ER after referral from [facility].  He had a mechanical fall at home on 12/21/2019 landing on his right side. He was trying to recover at home but was mostly bedbound and only getting to the chair or bathroom by pivoting and not putting much weight on his right lower extremity. He had an x-ray at the orthopedic clinic which revealed a right femoral neck fracture and was sent to the ER. Repeat x-ray performed on admission confirmed a comminuted displaced subcapital right hip fracture. Orthopedic surgery evaluated the patient and after 48 hours of holding Eliquis, he is planning to undergo repair.  Right hip hemiarthroplasty was performed on 12/29/19.  He began working with PT on 9/20 and Eliquis was resumed. He will plan to go to rehab at discharge also. WOC was consulted regarding his LE ulcers wrapped in Unna boots on admission with recommendations given. He will need to continue outpatient routine care with his wound center also after discharge.    Interval History:  No events overnight. Underwent right hip repair in the OR on 9/19.  He was in the chair this am when seen and his pain is much better now that he's had surgical repair.  He is amenable for rehab at discharge.   Old records reviewed in assessment of this patient  ROS: Constitutional: negative for chills and fevers, Respiratory: negative for cough, Cardiovascular: negative for chest pain and Gastrointestinal: negative for abdominal pain  Assessment & Plan: Alcohol use -Monitor for any signs of withdrawal but has been pleasant with no concerning symptoms at this time  Atrial fibrillation (HCC) -Eliquis  resumed 9/20 - continue cardizem  Closed displaced fracture of right femoral neck (HCC) - s/p mechanical fall at home on 9/11; has not been able to ambulate and ultimately presented on 12/27/2019 for evaluation and was found to have fracture as noted -Orthopedic surgery following, appreciate assistance.  - s/p right hip hemiarthroplasty 12/29/19 -Continue pain control - await bed placement at SNF  HTN (hypertension) - continue cardizem  Chronic cutaneous venous stasis ulcer (HCC) - Bilateral LE  -Follows with outpatient wound center. Last note reviewed from 11/29/2019 - evaluated by WOC, appreciate recommendations while inpatient, see below: "cleansing, moisturizing (with Eucerin) and dressing with xeroform gauze topped with dry gauze prior to rapping from toes to knee with Kerlix roll gauze topped with ACE bandage.  Heels are to be floated and LEs elevated."   Antimicrobials: Ancef 9/19>>present  DVT prophylaxis: HSQ prior to sx; likely resuming Eliquis Monday Code Status: DNR Family Communication: none present Disposition Plan: Status is: Inpatient  Remains inpatient appropriate because:Unsafe d/c plan, IV treatments appropriate due to intensity of illness or inability to take PO and Inpatient level of care appropriate due to severity of illness   Dispo: The patient is from: Home              Anticipated d/c is to: pending PT eval after surgery              Anticipated d/c date is: 3 days              Patient currently is not medically stable  to d/c.   Objective: Blood pressure (!) 99/56, pulse 78, temperature (!) 97.5 F (36.4 C), temperature source Oral, resp. rate 15, height 6' (1.829 m), weight 101.6 kg, SpO2 99 %.  Examination: General appearance: Pleasant elderly man lying in bed in no distress Head: Normocephalic, without obvious abnormality, atraumatic Eyes: EOMI Lungs: clear to auscultation bilaterally Heart: irregularly irregular rhythm and S1, S2  normal Abdomen: normal findings: bowel sounds normal and soft, non-tender Extremities: Right sugical dressing in place with soft thigh, no TTP at this time. Bilateral unna boots noted; exposed feet are noted to have 3+ edema worse on the right Skin: Chronic stasis changes noted in bilateral lower extremities Neurologic: no obvious deficits; RLE spared from exam but sensation intact   Consultants:   Ortho  Procedures:   none  Data Reviewed: I have personally reviewed following labs and imaging studies Results for orders placed or performed during the hospital encounter of 12/27/19 (from the past 24 hour(s))  Basic metabolic panel     Status: Abnormal   Collection Time: 12/30/19  5:34 AM  Result Value Ref Range   Sodium 136 135 - 145 mmol/L   Potassium 4.1 3.5 - 5.1 mmol/L   Chloride 97 (L) 98 - 111 mmol/L   CO2 29 22 - 32 mmol/L   Glucose, Bld 139 (H) 70 - 99 mg/dL   BUN 35 (H) 8 - 23 mg/dL   Creatinine, Ser 1.611.15 0.61 - 1.24 mg/dL   Calcium 8.9 8.9 - 09.610.3 mg/dL   GFR calc non Af Amer >60 >60 mL/min   GFR calc Af Amer >60 >60 mL/min   Anion gap 10 5 - 15  CBC with Differential/Platelet     Status: Abnormal   Collection Time: 12/30/19  5:34 AM  Result Value Ref Range   WBC 10.7 (H) 4.0 - 10.5 K/uL   RBC 3.94 (L) 4.22 - 5.81 MIL/uL   Hemoglobin 11.6 (L) 13.0 - 17.0 g/dL   HCT 04.533.9 (L) 39 - 52 %   MCV 86.0 80.0 - 100.0 fL   MCH 29.4 26.0 - 34.0 pg   MCHC 34.2 30.0 - 36.0 g/dL   RDW 40.914.0 81.111.5 - 91.415.5 %   Platelets 245 150 - 400 K/uL   nRBC 0.0 0.0 - 0.2 %   Neutrophils Relative % 86 %   Neutro Abs 9.2 (H) 1.7 - 7.7 K/uL   Lymphocytes Relative 7 %   Lymphs Abs 0.7 0.7 - 4.0 K/uL   Monocytes Relative 6 %   Monocytes Absolute 0.6 0 - 1 K/uL   Eosinophils Relative 0 %   Eosinophils Absolute 0.0 0 - 0 K/uL   Basophils Relative 0 %   Basophils Absolute 0.0 0 - 0 K/uL   Immature Granulocytes 1 %   Abs Immature Granulocytes 0.07 0.00 - 0.07 K/uL  Magnesium     Status: None    Collection Time: 12/30/19  5:34 AM  Result Value Ref Range   Magnesium 2.1 1.7 - 2.4 mg/dL    Recent Results (from the past 240 hour(s))  SARS Coronavirus 2 by RT PCR (hospital order, performed in [facility] hospital lab) Nasopharyngeal Nasopharyngeal Swab     Status: None   Collection Time: 12/27/19  4:10 PM   Specimen: Nasopharyngeal Swab  Result Value Ref Range Status   SARS Coronavirus 2 NEGATIVE NEGATIVE Final    Comment: (NOTE) SARS-CoV-2 target nucleic acids are NOT DETECTED.  The SARS-CoV-2 RNA is generally detectable in upper and lower  respiratory specimens during the acute phase of infection. The lowest concentration of SARS-CoV-2 viral copies this assay can detect is 250 copies / mL. A negative result does not preclude SARS-CoV-2 infection and should not be used as the sole basis for treatment or other patient management decisions.  A negative result may occur with improper specimen collection / handling, submission of specimen other than nasopharyngeal swab, presence of viral mutation(s) within the areas targeted by this assay, and inadequate number of viral copies (<250 copies / mL). A negative result must be combined with clinical observations, patient history, and epidemiological information.  Fact Sheet for Patients:   BoilerBrush.com.cy  Fact Sheet for Healthcare Providers: https://pope.com/  This test is not yet approved or  cleared by the [place] FDA and has been authorized for detection and/or diagnosis of SARS-CoV-2 by FDA under an Emergency Use Authorization (EUA).  This EUA will remain in effect (meaning this test can be used) for the duration of the COVID-19 declaration under Section 564(b)(1) of the Act, 21 U.S.C. section 360bbb-3(b)(1), unless the authorization is terminated or revoked sooner.  Performed at [facility], [address] [place], [place] [address]      Radiology  Studies: DG HIP Lucienne Capers OR W/O PELVIS 2-3 VIEWS RIGHT  Result Date: 12/29/2019 CLINICAL DATA:  Right hip arthroplasty. EXAM: DG HIP (WITH OR WITHOUT PELVIS) 2-3V RIGHT COMPARISON:  12/27/2019 FINDINGS: Expected changes post right hip arthroplasty with prosthesis intact and normally located. Remainder of the exam is unchanged. IMPRESSION: Expected changes post right hip arthroplasty. Electronically Signed   By: Elberta Fortis M.D.   On: 12/29/2019 11:48   DG HIP UNILAT W OR W/O PELVIS 2-3 VIEWS RIGHT  Final Result    DG Hip Unilat With Pelvis 2-3 Views Right  Final Result    DG Chest 1 View  Final Result      Scheduled Meds: . apixaban  5 mg Oral BID  . diltiazem  120 mg Oral Daily  . docusate sodium  100 mg Oral BID  . feeding supplement  237 mL Oral BID BM  . ferrous sulfate  325 mg Oral BID WC  . folic acid  500 mcg Oral Daily  . hydrocerin   Topical Daily  . melatonin  2.5 mg Oral QHS  . metoprolol succinate  50 mg Oral Daily  . multivitamin with minerals  1 tablet Oral Daily  . omega-3 acid ethyl esters  1 g Oral BID  . potassium chloride SA  20 mEq Oral BID  . simvastatin  40 mg Oral Daily  . tamsulosin  0.4 mg Oral QPC breakfast  . thiamine  100 mg Oral Daily  . torsemide  20 mg Oral BID  . traMADol  50 mg Oral Q6H  . vitamin B-12  500 mcg Oral Daily   PRN Meds: acetaminophen, bisacodyl, diphenhydrAMINE, magnesium hydroxide, methocarbamol **OR** methocarbamol (ROBAXIN) IV, metoCLOPramide **OR** metoCLOPramide (REGLAN) injection, morphine injection, ondansetron **OR** ondansetron (ZOFRAN) IV, oxyCODONE, polyethylene glycol, sodium phosphate Continuous Infusions: . sodium chloride 75 mL/hr at 12/30/19 0211  . methocarbamol (ROBAXIN) IV        LOS: 3 days  Time spent: Greater than 50% of the 35 minute visit was spent in counseling/coordination of care for the patient as laid out in the A&P.   Lewie Chamber, MD [facility] 12/30/2019, 1:41 PM  Contact via  secure chat.  To contact the attending provider between 7A-7P or the covering provider during after hours 7P-7A, please  log into the web site [email or web address] and access using universal [place] password for that web site. If you do not have the password, please call the hospital operator.

## 2019-12-30 NOTE — Progress Notes (Signed)
PT Cancellation Note  Patient Details Name: Carlos Roach MRN: [id number] DOB: 1940-10-17   Cancelled Treatment:    Reason Eval/Treat Not Completed: Other (comment). Pt refused PM session with PT, reported he had recently returned to bed with OT, requesting to rest and do more tomorrow. Posterior hip precautions reinforced verbally, all needs in reach. CNA notified of pt request to sleep.   Olga Coaster PT, DPT 4:18 PM,12/30/19

## 2019-12-31 LAB — BASIC METABOLIC PANEL
Anion gap: 9 (ref 5–15)
BUN: 39 mg/dL — ABNORMAL HIGH (ref 8–23)
CO2: 29 mmol/L (ref 22–32)
Calcium: 8.7 mg/dL — ABNORMAL LOW (ref 8.9–10.3)
Chloride: 98 mmol/L (ref 98–111)
Creatinine, Ser: 0.93 mg/dL (ref 0.61–1.24)
GFR calc Af Amer: >60 mL/min (ref >60)
GFR calc non Af Amer: >60 mL/min (ref >60)
Glucose, Bld: 111 mg/dL — ABNORMAL HIGH (ref 70–99)
Potassium: 3.9 mmol/L (ref 3.5–5.1)
Sodium: 136 mmol/L (ref 135–145)

## 2019-12-31 LAB — IRON AND TIBC
Iron: 55 ug/dL (ref 45–182)
Saturation Ratios: 29 % (ref 17.9–39.5)
TIBC: 190 ug/dL — ABNORMAL LOW (ref 250–450)
UIBC: 135 ug/dL

## 2019-12-31 LAB — CBC WITH DIFFERENTIAL/PLATELET
Abs Immature Granulocytes: 0.08 10*3/uL — ABNORMAL HIGH (ref 0.00–0.07)
Basophils Absolute: 0 10*3/uL (ref 0.0–0.1)
Basophils Relative: 0 %
Eosinophils Absolute: 0 10*3/uL (ref 0.0–0.5)
Eosinophils Relative: 0 %
HCT: 32.2 % — ABNORMAL LOW (ref 39.0–52.0)
Hemoglobin: 10.8 g/dL — ABNORMAL LOW (ref 13.0–17.0)
Immature Granulocytes: 1 %
Lymphocytes Relative: 11 %
Lymphs Abs: 1.1 10*3/uL (ref 0.7–4.0)
MCH: 28.9 pg (ref 26.0–34.0)
MCHC: 33.5 g/dL (ref 30.0–36.0)
MCV: 86.1 fL (ref 80.0–100.0)
Monocytes Absolute: 0.9 10*3/uL (ref 0.1–1.0)
Monocytes Relative: 9 %
Neutro Abs: 7.7 10*3/uL (ref 1.7–7.7)
Neutrophils Relative %: 79 %
Platelets: 245 10*3/uL (ref 150–400)
RBC: 3.74 MIL/uL — ABNORMAL LOW (ref 4.22–5.81)
RDW: 14.4 % (ref 11.5–15.5)
WBC: 9.8 10*3/uL (ref 4.0–10.5)
nRBC: 0 % (ref 0.0–0.2)

## 2019-12-31 LAB — RETICULOCYTES
Immature Retic Fract: 8.4 % (ref 2.3–15.9)
RBC.: 3.72 MIL/uL — ABNORMAL LOW (ref 4.22–5.81)
Retic Count, Absolute: 51.7 10*3/uL (ref 19.0–186.0)
Retic Ct Pct: 1.4 % (ref 0.4–3.1)

## 2019-12-31 LAB — FERRITIN: Ferritin: 573 ng/mL — ABNORMAL HIGH (ref 24–336)

## 2019-12-31 LAB — MAGNESIUM: Magnesium: 2.1 mg/dL (ref 1.7–2.4)

## 2019-12-31 LAB — FOLATE: Folate: 20.5 ng/mL (ref >5.9)

## 2019-12-31 LAB — VITAMIN B12: Vitamin B-12: 564 pg/mL (ref 180–914)

## 2019-12-31 MED ORDER — TRAMADOL HCL 50 MG PO TABS: 50.0000 mg | ORAL_TABLET | Freq: Four times a day (QID) | ORAL | 0 refills | Status: AC

## 2019-12-31 NOTE — NC FL2 (Signed)
[place] MEDICAID FL2 LEVEL OF CARE SCREENING TOOL     IDENTIFICATION  Patient Name: Carlos Roach Birthdate: [birth year] Sex: male Admission Date (Current Location): 12/27/2019  [place] and [place] Number:  Chiropodist and Address:  [facility], [address], [place], [place] [address]      Provider Number: [id number]  Attending Physician Name and Address:  Arnetha Courser, MD  Relative Name and Phone Number:  Yahia Bottger [phone]    Current Level of Care: Hospital Recommended Level of Care: Skilled Nursing Facility Prior Approval Number:    Date Approved/Denied:   PASRR Number: [id number]  Discharge Plan: SNF    Current Diagnoses: Patient Active Problem List   Diagnosis Date Noted  . Chronic cutaneous venous stasis ulcer (HCC) 12/28/2019  . Closed displaced fracture of right femoral neck (HCC) 12/27/2019  . Fall   . HTN (hypertension) 06/15/2017  . Atrial fibrillation (HCC) 06/15/2017  . CHF (congestive heart failure) (HCC) 06/04/2017    Orientation RESPIRATION BLADDER Height & Weight     Self, Time, Situation, Place  Normal Continent Weight: 101.6 kg Height:  6' (182.9 cm)  BEHAVIORAL SYMPTOMS/MOOD NEUROLOGICAL BOWEL NUTRITION STATUS      Continent Diet (Heart Healthy)  AMBULATORY STATUS COMMUNICATION OF NEEDS Skin   Limited Assist Verbally Other (Comment) (unna boots to lower extremities)                       Personal Care Assistance Level of Assistance  Feeding, Bathing, Dressing Bathing Assistance: Limited assistance Feeding assistance: Independent Dressing Assistance: Limited assistance     Functional Limitations Info  Sight, Hearing, Speech Sight Info: Adequate Hearing Info: Adequate Speech Info: Adequate    SPECIAL CARE FACTORS FREQUENCY  PT (By licensed PT), OT (By licensed OT)                    Contractures Contractures Info: Not present    Additional Factors Info  Code  Status, Allergies Code Status Info: DNR Allergies Info: Mucinex           Current Medications (12/31/2019):  This is the current hospital active medication list Current Facility-Administered Medications  Medication Dose Route Frequency Provider Last Rate Last Admin  . 0.9 %  sodium chloride infusion   Intravenous Continuous Poggi, Excell Seltzer, MD 75 mL/hr at 12/30/19 0211 New Bag at 12/30/19 0211  . acetaminophen (TYLENOL) tablet 650 mg  650 mg Oral Q4H PRN Lewie Chamber, MD      . apixaban Everlene Balls) tablet 5 mg  5 mg Oral BID Poggi, Excell Seltzer, MD   5 mg at 12/31/19 0855  . bisacodyl (DULCOLAX) suppository 10 mg  10 mg Rectal Daily PRN Poggi, Excell Seltzer, MD      . diltiazem (CARDIZEM CD) 24 hr capsule 120 mg  120 mg Oral Daily Poggi, Excell Seltzer, MD   120 mg at 12/30/19 1007  . diphenhydrAMINE (BENADRYL) 12.5 MG/5ML elixir 12.5-25 mg  12.5-25 mg Oral Q4H PRN Poggi, Excell Seltzer, MD      . docusate sodium (COLACE) capsule 100 mg  100 mg Oral BID Christena Flake, MD   100 mg at 12/31/19 0854  . feeding supplement (ENSURE SURGERY) liquid 237 mL  237 mL Oral BID BM Lewie Chamber, MD   237 mL at 12/31/19 0856  . ferrous sulfate tablet 325 mg  325 mg Oral BID WC Poggi, Excell Seltzer, MD   325 mg at 12/31/19 0854  .  folic acid (FOLVITE) tablet 0.5 mg  500 mcg Oral Daily Poggi, Excell Seltzer, MD   0.5 mg at 12/31/19 0854  . hydrocerin (EUCERIN) cream   Topical Daily Lewie Chamber, MD   Given at 12/31/19 [phone]  . magnesium hydroxide (MILK OF MAGNESIA) suspension 30 mL  30 mL Oral Daily PRN Poggi, Excell Seltzer, MD   30 mL at 12/30/19 0959  . melatonin tablet 2.5 mg  2.5 mg Oral QHS Poggi, Excell Seltzer, MD   2.5 mg at 12/30/19 2100  . methocarbamol (ROBAXIN) tablet 500 mg  500 mg Oral Q6H PRN Poggi, Excell Seltzer, MD   500 mg at 12/28/19 1833   Or  . methocarbamol (ROBAXIN) 500 mg in dextrose 5 % 50 mL IVPB  500 mg Intravenous Q6H PRN Poggi, Excell Seltzer, MD      . metoCLOPramide (REGLAN) tablet 5-10 mg  5-10 mg Oral Q8H PRN Poggi, Excell Seltzer, MD       Or  .  metoCLOPramide (REGLAN) injection 5-10 mg  5-10 mg Intravenous Q8H PRN Poggi, Excell Seltzer, MD      . metoprolol succinate (TOPROL-XL) 24 hr tablet 50 mg  50 mg Oral Daily Poggi, Excell Seltzer, MD   50 mg at 12/31/19 0854  . morphine 2 MG/ML injection 2-4 mg  2-4 mg Intravenous Q3H PRN Poggi, Excell Seltzer, MD   2 mg at 12/29/19 1431  . multivitamin with minerals tablet 1 tablet  1 tablet Oral Daily Poggi, Excell Seltzer, MD   1 tablet at 12/31/19 0853  . omega-3 acid ethyl esters (LOVAZA) capsule 1 g  1 g Oral BID Poggi, Excell Seltzer, MD   1 g at 12/31/19 0854  . ondansetron (ZOFRAN) tablet 4 mg  4 mg Oral Q6H PRN Poggi, Excell Seltzer, MD       Or  . ondansetron (ZOFRAN) injection 4 mg  4 mg Intravenous Q6H PRN Poggi, Excell Seltzer, MD      . oxyCODONE (Oxy IR/ROXICODONE) immediate release tablet 5 mg  5 mg Oral Q4H PRN Lewie Chamber, MD      . polyethylene glycol (MIRALAX / GLYCOLAX) packet 17 g  17 g Oral Daily PRN Poggi, Excell Seltzer, MD      . potassium chloride SA (KLOR-CON) CR tablet 20 mEq  20 mEq Oral BID Christena Flake, MD   20 mEq at 12/31/19 0855  . simvastatin (ZOCOR) tablet 40 mg  40 mg Oral Daily Poggi, Excell Seltzer, MD   40 mg at 12/31/19 0853  . sodium phosphate (FLEET) 7-19 GM/118ML enema 1 enema  1 enema Rectal Once PRN Poggi, Excell Seltzer, MD      . tamsulosin (FLOMAX) capsule 0.4 mg  0.4 mg Oral QPC breakfast Poggi, Excell Seltzer, MD   0.4 mg at 12/31/19 0856  . thiamine tablet 100 mg  100 mg Oral Daily Poggi, Excell Seltzer, MD   100 mg at 12/31/19 0854  . torsemide (DEMADEX) tablet 20 mg  20 mg Oral BID Poggi, Excell Seltzer, MD   20 mg at 12/31/19 0853  . traMADol (ULTRAM) tablet 50 mg  50 mg Oral Q6H Poggi, Excell Seltzer, MD   50 mg at 12/31/19 0713  . vitamin B-12 (CYANOCOBALAMIN) tablet 500 mcg  500 mcg Oral Daily Poggi, Excell Seltzer, MD   500 mcg at 12/31/19 7026     Discharge Medications: Please see discharge summary for a list of discharge medications.  Relevant Imaging Results:  Relevant Lab Results:   Additional Information SS#  [id number]  Trenton Founds, RN

## 2019-12-31 NOTE — TOC Initial Note (Signed)
Transition of Care [facility]) - Initial/Assessment Note    Patient Details  Name: Carlos Roach MRN: [id number] Date of Birth: 1940/04/13  [facility] CM/SW Contact:    Trenton Founds, RN Phone Number: 12/31/2019, 12:57 PM  Clinical Narrative:    RNCM assessed patient by telephone. Patient alert, verbally responsive and appropriate. Discussed with patient that it is recommended that he go to SNF when he leaves hospital for additional therapy, patient verbalizes understanding and that he agrees with this. He reports that he was just discharged from [facility] in [place] where he was for approx 6 days. Patient reports that he would prefer to stay locally if possible and is agreeable to bed requests being sent to all the local facilities. RNCM verified PASSR, completed FL-2 and initiated bed search. Will follow up with patient when bed offers are available to discuss.           Expected Discharge Plan: Skilled Nursing Facility Barriers to Discharge: No Barriers Identified   Patient Goals and CMS Choice     Choice offered to / list presented to : Patient  Expected Discharge Plan and Services Expected Discharge Plan: Skilled Nursing Facility   Discharge Planning Services: CM Consult Post Acute Care Choice: Skilled Nursing Facility Living arrangements for the past 2 months: Single Family Home                                      Prior Living Arrangements/Services Living arrangements for the past 2 months: Single Family Home Lives with:: Spouse Patient language and need for interpreter reviewed:: Yes Do you feel safe going back to the place where you live?: Yes      Need for Family Participation in Patient Care: Yes (Comment) Care giver support system in place?: Yes (comment)   Criminal Activity/Legal Involvement Pertinent to Current Situation/Hospitalization: No - Comment as needed  Activities of Daily Living Home Assistive Devices/Equipment: Walker (specify  type), Cane (specify quad or straight) ADL Screening (condition at time of admission) Patient's cognitive ability adequate to safely complete daily activities?: Yes Is the patient deaf or have difficulty hearing?: No Does the patient have difficulty seeing, even when wearing glasses/contacts?: No Does the patient have difficulty concentrating, remembering, or making decisions?: No Patient able to express need for assistance with ADLs?: Yes Does the patient have difficulty dressing or bathing?: Yes Independently performs ADLs?: No Communication: Independent Dressing (OT): Needs assistance Is this a change from baseline?: Change from baseline, expected to last >3 days Grooming: Independent Feeding: Independent Bathing: Needs assistance Is this a change from baseline?: Change from baseline, expected to last >3 days Toileting: Needs assistance Is this a change from baseline?: Change from baseline, expected to last >3days In/Out Bed: Dependent Is this a change from baseline?: Change from baseline, expected to last >3 days Walks in Home: Dependent Is this a change from baseline?: Change from baseline, expected to last >3 days Does the patient have difficulty walking or climbing stairs?: Yes Weakness of Legs: Right Weakness of Arms/Hands: None  Permission Sought/Granted                  Emotional Assessment   Attitude/Demeanor/Rapport: Engaged Affect (typically observed): Appropriate Orientation: : Oriented to Situation, Oriented to  Time, Oriented to Place, Oriented to Self Alcohol / Substance Use: Not Applicable Psych Involvement: No (comment)  Admission diagnosis:  Closed fracture of right hip, initial  encounter [facility]) [S72.001A] Closed displaced fracture of right femoral neck (HCC) [S72.001A] Longstanding persistent atrial fibrillation (HCC) [I48.11] Patient Active Problem List   Diagnosis Date Noted  . Chronic cutaneous venous stasis ulcer (HCC) 12/28/2019  . Closed  displaced fracture of right femoral neck (HCC) 12/27/2019  . Fall   . HTN (hypertension) 06/15/2017  . Atrial fibrillation (HCC) 06/15/2017  . CHF (congestive heart failure) (HCC) 06/04/2017   PCP:  Clent Jacks, PA-C Pharmacy:   [facility] [phone] [facility] [place] - [address] AT [facility] [address] [facility] [place] [address] Phone: [phone] Fax: [phone]     Social Determinants of Health (SDOH) Interventions    Readmission Risk Interventions No flowsheet data found.

## 2019-12-31 NOTE — Progress Notes (Signed)
Subjective: 2 Days Post-Op Procedure(s) (LRB): ARTHROPLASTY BIPOLAR HIP (HEMIARTHROPLASTY) (Right) Patient reports pain as mild.   Patient is well, and has had no acute complaints or problems Plan is to go Rehab after hospital stay. Negative for chest pain and shortness of breath Fever: no Gastrointestinal: Negative for nausea and vomiting  Objective: Vital signs in last 24 hours: Temp:  [97.5 F (36.4 C)-97.8 F (36.6 C)] 97.7 F (36.5 C) (09/20 2307) Pulse Rate:  [68-87] 68 (09/20 2307) Resp:  [15-18] 17 (09/20 2307) BP: (94-114)/(56-80) 98/62 (09/20 2307) SpO2:  [92 %-100 %] 94 % (09/20 2307)  Intake/Output from previous day:  Intake/Output Summary (Last 24 hours) at 12/31/2019 0737 Last data filed at 12/31/2019 0509 Gross per 24 hour  Intake 1770.73 ml  Output 1900 ml  Net -129.27 ml    Intake/Output this shift: No intake/output data recorded.  Labs: Recent Labs    12/29/19 0426 12/30/19 0534 12/31/19 0532  HGB 12.5* 11.6* 10.8*   Recent Labs    12/30/19 0534 12/31/19 0532  WBC 10.7* 9.8  RBC 3.94* 3.74*  HCT 33.9* 32.2*  PLT 245 245   Recent Labs    12/30/19 0534 12/31/19 0532  NA 136 136  K 4.1 3.9  CL 97* 98  CO2 29 29  BUN 35* 39*  CREATININE 1.15 0.93  GLUCOSE 139* 111*  CALCIUM 8.9 8.7*   No results for input(s): LABPT, INR in the last 72 hours.   EXAM General - Patient is Alert, Appropriate and Oriented Extremity - Neurovascular intact Sensation intact distally Dorsiflexion/Plantar flexion intact Dressing/Incision - Moderate blood tinged drainage to the right hip this AM. Motor Function - intact, moving foot and toes well on exam.  Unna boots intact to lower extremities.  Past Medical History:  Diagnosis Date  . Alcohol use   . Arrhythmia    atrial fibrillation  . Cellulitis   . Chronic atrial fibrillation (HCC)    a. CHADS2VASc 5 (CHF, HTN, age x 2, vascular disease)  . Chronic systolic CHF (congestive heart failure) (HCC)     a. TTE 1/19: EF 50%, mild LVH, trivial MR/PR/TR, small pericardial effusion  . Chronic venous stasis   . GI bleed   . Hypertension   . Obstructive sleep apnea     Assessment/Plan: 2 Days Post-Op Procedure(s) (LRB): ARTHROPLASTY BIPOLAR HIP (HEMIARTHROPLASTY) (Right) Principal Problem:   Closed displaced fracture of right femoral neck (HCC) Active Problems:   HTN (hypertension)   Atrial fibrillation (HCC)   Fall   Chronic cutaneous venous stasis ulcer (HCC)  Estimated body mass index is 30.38 kg/m as calculated from the following:   Height as of this encounter: 6' (1.829 m).   Weight as of this encounter: 101.6 kg. Advance diet Up with therapy D/C IV fluids   Labs reviewed this AM. Patient has had a BM. Plan is for discharge to SNF. Continue with PT today.  DVT Prophylaxis - TED hose and Eliquis Weight-Bearing as tolerated to right leg  J. Horris Latino, PA-C Orthopaedic Surgery 12/31/2019, 7:37 AM

## 2019-12-31 NOTE — Progress Notes (Addendum)
PROGRESS NOTE    Carlos Roach  VQM:[id number] DOB: [birth year] DOA: 12/27/2019 PCP: Clent Jacks, PA-C   Brief Narrative:  Carlos Roach is a 79 year old Caucasian male with PMH OSA, hypertension, chronic systolic CHF, A. fib on Eliquis, alcohol use who presented to the ER after referral from [facility].  He had a mechanical fall at home on 12/21/2019 landing on his right side. He was trying to recover at home but was mostly bedbound and only getting to the chair or bathroom by pivoting and not putting much weight on his right lower extremity. He had an x-ray at the orthopedic clinic which revealed a right femoral neck fracture and was sent to the ER. Repeat x-ray performed on admission confirmed a comminuted displaced subcapital right hip fracture. Orthopedic surgery evaluated the patient and after 48 hours of holding Eliquis, patient underwent right hip hemiarthroplasty on 12/29/2019. Eliquis was resumed next day. PT is recommending rehab.  Subjective: Patient was sitting in chair comfortably when seen today. He was having some difficulty with ambulation. No other complaints.  Assessment & Plan:   Principal Problem:   Closed displaced fracture of right femoral neck (HCC) Active Problems:   HTN (hypertension)   Atrial fibrillation (HCC)   Fall   Chronic cutaneous venous stasis ulcer (HCC)  Closed displaced fracture of right femoral neck (HCC)  s/p mechanical fall at home on 9/11; has not been able to ambulate and ultimately presented on 12/27/2019 for evaluation and was found to have fracture as noted Orthopedic surgery following, appreciate assistance.   s/p right hip hemiarthroplasty 12/29/19 -Continue pain control - await bed placement at SNF  Postoperative anemia. Hemoglobin dropped to 9.8 today. Most likely secondary to recent surgery. -We will obtain anemia panel.  HTN (hypertension) blood pressure within goal. - continue cardizem  Atrial fibrillation (HCC) -Eliquis  resumed 9/20 - continue cardizem.  Alcohol abuse. No current concern. -Continue with CIWA protocol without Ativan.  Chronic cutaneous venous stasis ulcer (HCC) - Bilateral LE  -Follows with outpatient wound center. Last note reviewed from 11/29/2019 - evaluated by WOC, appreciate recommendations while inpatient, see below: "cleansing, moisturizing (with Eucerin) and dressing with xeroform gauze topped with dry gauze prior to rapping from toes to knee with Kerlix roll gauze topped with ACE bandage. Heels are to be floated and LEs elevated."  Objective: Vitals:   12/30/19 1217 12/30/19 1514 12/30/19 2307 12/31/19 0813  BP: (!) 99/56 114/80 98/62 96/60   Pulse: 78 87 68 73  Resp: 15 16 17 17   Temp: (!) 97.5 F (36.4 C) (!) 97.5 F (36.4 C) 97.7 F (36.5 C) 97.9 F (36.6 C)  TempSrc: Oral Oral Oral Oral  SpO2: 99% 100% 94% 94%  Weight:      Height:        Intake/Output Summary (Last 24 hours) at 12/31/2019 1337 Last data filed at 12/31/2019 1005 Gross per 24 hour  Intake 600 ml  Output 1900 ml  Net -1300 ml   Filed Weights   12/27/19 1518  Weight: 101.6 kg    Examination:  General exam: Appears calm and comfortable, sitting in chair. Respiratory system: Clear to auscultation. Respiratory effort normal. Cardiovascular system: S1 & S2 heard, RRR. No JVD, murmurs, Gastrointestinal system: Soft, nontender, nondistended, bowel sounds positive. Central nervous system: Alert and oriented. No focal neurological deficits. Extremities: Bilateral lower extremities with Ace wrap. Psychiatry: Judgement and insight appear normal.   DVT prophylaxis: Eliquis Code Status: DNR Family Communication: Talk with son on phone. Disposition Plan:  Status is: Inpatient  Remains inpatient appropriate because:Inpatient level of care appropriate due to severity of illness   Dispo: The patient is from: Home              Anticipated d/c is to: SNF              Anticipated d/c date is: 1 day               Patient currently is medically stable to d/c.    Consultants:   Orthopedic surgery  Procedures:  Antimicrobials:   Data Reviewed: I have personally reviewed following labs and imaging studies  CBC: Recent Labs  Lab 12/27/19 1610 12/28/19 0525 12/29/19 0426 12/30/19 0534 12/31/19 0532  WBC 8.7 6.6 7.2 10.7* 9.8  NEUTROABS 6.6  --  5.1 9.2* 7.7  HGB 13.7 12.4* 12.5* 11.6* 10.8*  HCT 40.0 36.8* 38.8* 33.9* 32.2*  MCV 85.3 86.8 89.0 86.0 86.1  PLT 241 222 251 245 245   Basic Metabolic Panel: Recent Labs  Lab 12/27/19 1610 12/28/19 0525 12/29/19 0426 12/30/19 0534 12/31/19 0532  NA 136 136 136 136 136  K 3.6 3.8 4.3 4.1 3.9  CL 96* 98 97* 97* 98  CO2 29 28 31 29 29   GLUCOSE 117* 100* 106* 139* 111*  BUN 29* 28* 32* 35* 39*  CREATININE 0.86 0.75 0.95 1.15 0.93  CALCIUM 9.4 9.1 9.2 8.9 8.7*  MG  --   --  2.2 2.1 2.1   GFR: Estimated Creatinine Clearance: 82 mL/min (by C-G formula based on SCr of 0.93 mg/dL). Liver Function Tests: Recent Labs  Lab 12/27/19 1610  AST 29  ALT 24  ALKPHOS 96  BILITOT 1.9*  PROT 8.2*  ALBUMIN 3.4*   No results for input(s): LIPASE, AMYLASE in the last 168 hours. No results for input(s): AMMONIA in the last 168 hours. Coagulation Profile: Recent Labs  Lab 12/27/19 1610  INR 1.3*   Cardiac Enzymes: No results for input(s): CKTOTAL, CKMB, CKMBINDEX, TROPONINI in the last 168 hours. BNP (last 3 results) No results for input(s): PROBNP in the last 8760 hours. HbA1C: No results for input(s): HGBA1C in the last 72 hours. CBG: No results for input(s): GLUCAP in the last 168 hours. Lipid Profile: No results for input(s): CHOL, HDL, LDLCALC, TRIG, CHOLHDL, LDLDIRECT in the last 72 hours. Thyroid Function Tests: No results for input(s): TSH, T4TOTAL, FREET4, T3FREE, THYROIDAB in the last 72 hours. Anemia Panel: Recent Labs    12/31/19 0532  VITAMINB12 564  FOLATE 20.5  FERRITIN 573*  TIBC 190*  IRON 55   RETICCTPCT 1.4   Sepsis Labs: No results for input(s): PROCALCITON, LATICACIDVEN in the last 168 hours.  Recent Results (from the past 240 hour(s))  SARS Coronavirus 2 by RT PCR (hospital order, performed in [facility] hospital lab) Nasopharyngeal Nasopharyngeal Swab     Status: None   Collection Time: 12/27/19  4:10 PM   Specimen: Nasopharyngeal Swab  Result Value Ref Range Status   SARS Coronavirus 2 NEGATIVE NEGATIVE Final    Comment: (NOTE) SARS-CoV-2 target nucleic acids are NOT DETECTED.  The SARS-CoV-2 RNA is generally detectable in upper and lower respiratory specimens during the acute phase of infection. The lowest concentration of SARS-CoV-2 viral copies this assay can detect is 250 copies / mL. A negative result does not preclude SARS-CoV-2 infection and should not be used as the sole basis for treatment or other patient management decisions.  A negative result may occur with improper  specimen collection / handling, submission of specimen other than nasopharyngeal swab, presence of viral mutation(s) within the areas targeted by this assay, and inadequate number of viral copies (<250 copies / mL). A negative result must be combined with clinical observations, patient history, and epidemiological information.  Fact Sheet for Patients:   BoilerBrush.com.cy  Fact Sheet for Healthcare Providers: https://pope.com/  This test is not yet approved or  cleared by the [place] FDA and has been authorized for detection and/or diagnosis of SARS-CoV-2 by FDA under an Emergency Use Authorization (EUA).  This EUA will remain in effect (meaning this test can be used) for the duration of the COVID-19 declaration under Section 564(b)(1) of the Act, 21 U.S.C. section 360bbb-3(b)(1), unless the authorization is terminated or revoked sooner.  Performed at [facility], [address], [place], [place] [address]       Radiology Studies: No results found.  Scheduled Meds: . apixaban  5 mg Oral BID  . diltiazem  120 mg Oral Daily  . docusate sodium  100 mg Oral BID  . feeding supplement  237 mL Oral BID BM  . ferrous sulfate  325 mg Oral BID WC  . folic acid  500 mcg Oral Daily  . hydrocerin   Topical Daily  . melatonin  2.5 mg Oral QHS  . metoprolol succinate  50 mg Oral Daily  . multivitamin with minerals  1 tablet Oral Daily  . omega-3 acid ethyl esters  1 g Oral BID  . potassium chloride SA  20 mEq Oral BID  . simvastatin  40 mg Oral Daily  . tamsulosin  0.4 mg Oral QPC breakfast  . thiamine  100 mg Oral Daily  . torsemide  20 mg Oral BID  . traMADol  50 mg Oral Q6H  . vitamin B-12  500 mcg Oral Daily   Continuous Infusions: . sodium chloride 75 mL/hr at 12/30/19 0211  . methocarbamol (ROBAXIN) IV       LOS: 4 days   Time spent: 34 minutes.  Arnetha Courser, MD [facility]  If 7PM-7AM, please contact night-coverage [email or web address]  12/31/2019, 1:37 PM   This record has been created using Conservation officer, historic buildings. Errors have been sought and corrected,but may not always be located. Such creation errors do not reflect on the standard of care.

## 2019-12-31 NOTE — Discharge Instructions (Signed)
Instructions after Total Hip Replacement     J. Jeffrey Poggi, M.D.  J. Lance Leslea Vowles, PA-C     Dept. of Orthopaedics & Sports Medicine  [facility]  [address]  [place], [place]  [address]  Phone: [phone]   Fax: [phone]    DIET: . Drink plenty of non-alcoholic fluids. . Resume your normal diet. Include foods high in fiber.  ACTIVITY:  . You may use crutches or a walker with weight-bearing as tolerated, unless instructed otherwise. . You may be weaned off of the walker or crutches by your Physical Therapist.  . Do NOT reach below the level of your knees or cross your legs until allowed.    . Continue doing gentle exercises. Exercising will reduce the pain and swelling, increase motion, and prevent muscle weakness.   . Please continue to use the TED compression stockings for 6 weeks. You may remove the stockings at night, but should reapply them in the morning. . Do not drive or operate any equipment until instructed.  WOUND CARE:  . Continue to use ice packs periodically to reduce pain and swelling. . Keep the incision clean and dry. . You may bathe or shower after the staples are removed at the first office visit following surgery.  MEDICATIONS: . You may resume your regular medications. . Please take the pain medication as prescribed on the medication. . Do not take pain medication on an empty stomach. . You have been given a prescription for a blood thinner to prevent blood clots. Please take the medication as instructed. (NOTE: After completing a 2 week course of Lovenox, take one Enteric-coated aspirin once a day.) . Pain medications and iron supplements can cause constipation. Use a stool softener (Senokot or Colace) on a daily basis and a laxative (dulcolax or miralax) as needed. . Do not drive or drink alcoholic beverages when taking pain medications.  CALL THE OFFICE FOR: . Temperature above 101 degrees . Excessive bleeding or drainage on the  dressing. . Excessive swelling, coldness, or paleness of the toes. . Persistent nausea and vomiting.  FOLLOW-UP:  . You should have an appointment to return to the office in 2 weeks after surgery. . Arrangements have been made for continuation of Physical Therapy (either home therapy or outpatient therapy).  

## 2019-12-31 NOTE — Progress Notes (Signed)
Physical Therapy Treatment Patient Details Name: Carlos Roach MRN: [id number] DOB: Jul 23, 1940 Today's Date: 12/31/2019    History of Present Illness Pt is a 79 yo male s/p R hip hemiarthroplasty due to a fall at home on 12/21/2019. PMH of HTN, CHF, afib, etoh abuse.    PT Comments    Pt alert, agreeable to PT, denied pain. Pt was able to perform supine to sit with minA, improved independence compared to last session and able to scoot to sit in midline without physical assist today. Sit <> stand with modA and cueing for hand placement twice today, and pt pivoted from EOB to [facility] and from [facility] to recliner. Pt still exhibited significant difficulty with initial standing balance, and required modA for safe transfers. Decreased RLE weight bearing and TKE noted, and pt tended to sit too early, improved with transfer to recliner with cueing. maxA for pericare after BSC. The patient was up in the chair, all needs in reach with RN at bedside. The patient would benefit from further skilled PT intervention to continue to progress towards goals. Recommendation remains appropriate.     Follow Up Recommendations  SNF     Equipment Recommendations  None recommended by PT;Other (comment)    Recommendations for Other Services OT consult     Precautions / Restrictions Precautions Precautions: Fall Restrictions Weight Bearing Restrictions: Yes RLE Weight Bearing: Weight bearing as tolerated    Mobility  Bed Mobility Overal bed mobility: Needs Assistance Bed Mobility: Supine to Sit     Supine to sit: HOB elevated;Min assist     General bed mobility comments: improved independence from last session  Transfers Overall transfer level: Needs assistance Equipment used: Rolling walker (2 wheeled) Transfers: Sit to/from [employer] Sit to Stand: Mod assist         General transfer comment: reliant on UE, not full weight bearing on RLE  Ambulation/Gait              General Gait Details: deferred   Stairs             Wheelchair Mobility    Modified Rankin (Stroke Patients Only)       Balance Overall balance assessment: Needs assistance Sitting-balance support: Feet supported Sitting balance-Leahy Scale: Fair       Standing balance-Leahy Scale: Poor Standing balance comment: modA to maintain standing                            Cognition Arousal/Alertness: Awake/alert Behavior During Therapy: WFL for tasks assessed/performed Overall Cognitive Status: Within Functional Limits for tasks assessed                                 General Comments: slow processing      Exercises General Exercises - Lower Extremity Ankle Circles/Pumps: AROM;Both;10 reps Long Arc Quad: AROM;Strengthening;Right;10 reps    General Comments        Pertinent Vitals/Pain Pain Assessment: No/denies pain    Home Living                      Prior Function            PT Goals (current goals can now be found in the care plan section) Progress towards PT goals: Progressing toward goals    Frequency    BID      PT Plan Current  plan remains appropriate    Co-evaluation              AM-PAC PT "6 Clicks" Mobility   Outcome Measure  Help needed turning from your back to your side while in a flat bed without using bedrails?: A Lot Help needed moving from lying on your back to sitting on the side of a flat bed without using bedrails?: A Lot Help needed moving to and from a bed to a chair (including a wheelchair)?: A Lot Help needed standing up from a chair using your arms (e.g., wheelchair or bedside chair)?: A Lot Help needed to walk in hospital room?: A Lot Help needed climbing 3-5 steps with a railing? : Total 6 Click Score: 11    End of Session Equipment Utilized During Treatment: Gait belt Activity Tolerance: Patient tolerated treatment well;Patient limited by fatigue Patient left: in  chair;with chair alarm set;with call bell/phone within reach Nurse Communication: Mobility status PT Visit Diagnosis: Muscle weakness (generalized) (M62.81);Difficulty in walking, not elsewhere classified (R26.2);History of falling (Z91.81);Pain Pain - Right/Left: Right Pain - part of body: Hip     Time: 7342-8768 PT Time Calculation (min) (ACUTE ONLY): 34 min  Charges:  $Therapeutic Activity: 23-37 mins                    Olga Coaster PT, DPT 9:28 AM,12/31/19

## 2019-12-31 NOTE — Progress Notes (Signed)
Physical Therapy Treatment Patient Details Name: Carlos Roach MRN: [id number] DOB: Oct 14, 1940 Today's Date: 12/31/2019    History of Present Illness Pt is a 79 yo male s/p R hip hemiarthroplasty due to a fall at home on 12/21/2019. PMH of HTN, CHF, afib, etoh abuse.    PT Comments    Pt tolerated treatment well today but continues to be limited secondary to fatigue and weakness. Pt continues to required increased assistance for sit<>stand and supine>sit of mod - min assist. Pt continues to demonstrate step to gait pattern with decreased step length, foot clearance, and TKE of RLE. Increased multimodal cueing required for safety with functional mobility. Pt able to recall WB precautions and 3/3 posterior hip precautions with min visual cueing for "no hip IR". Pt will continue to benefit from acute skilled PT services to address deficits and improve overall safety with functional mobility prior to DC.    Follow Up Recommendations  SNF     Equipment Recommendations  None recommended by PT;Other (comment) (TBD at next venue of care)    Recommendations for Other Services OT consult     Precautions / Restrictions Precautions Precautions: Fall;Posterior Hip Precaution Booklet Issued: No Restrictions Weight Bearing Restrictions: Yes RLE Weight Bearing: Weight bearing as tolerated    Mobility  Bed Mobility Overal bed mobility: Needs Assistance Bed Mobility: Sit to Supine       Sit to supine: Min assist   General bed mobility comments: Verbal/tactile cues for sequencing, requiring increased time/effort to perform mobility  Transfers Overall transfer level: Needs assistance Equipment used: Rolling walker (2 wheeled) Transfers: Sit to/from [employer] Sit to Stand: Mod assist Stand pivot transfers: Min assist       General transfer comment: Increased UE on RW; pt with improved TKE on RLE  Ambulation/Gait             General Gait Details:  deferred   Stairs             Wheelchair Mobility    Modified Rankin (Stroke Patients Only)       Balance Overall balance assessment: Needs assistance Sitting-balance support: Feet supported Sitting balance-Leahy Scale: Good Sitting balance - Comments: Mod I with BUE support while seated at EOB and in recliner   Standing balance support: Bilateral upper extremity supported;During functional activity Standing balance-Leahy Scale: Poor Standing balance comment: Min A for maintainance of upright balance during standing/ambulation                            Cognition Arousal/Alertness: Awake/alert Behavior During Therapy: WFL for tasks assessed/performed Overall Cognitive Status: Within Functional Limits for tasks assessed                                 General Comments: slow processing      Exercises General Exercises - Lower Extremity Ankle Circles/Pumps: AROM;Both;10 reps Gluteal Sets: AROM;Both;10 reps Long Arc Quad: AROM;AAROM;Both;10 reps;Other (comment) (Min A to achieve last 25% of R AROM) Hip ABduction/ADduction: AROM;10 reps;Both    General Comments        Pertinent Vitals/Pain Pain Assessment: No/denies pain    Home Living                      Prior Function            PT Goals (current goals can now be found  in the care plan section) Progress towards PT goals: Progressing toward goals    Frequency    BID      PT Plan Current plan remains appropriate    Co-evaluation              AM-PAC PT "6 Clicks" Mobility   Outcome Measure  Help needed turning from your back to your side while in a flat bed without using bedrails?: A Little Help needed moving from lying on your back to sitting on the side of a flat bed without using bedrails?: A Lot Help needed moving to and from a bed to a chair (including a wheelchair)?: A Lot Help needed standing up from a chair using your arms (e.g., wheelchair or  bedside chair)?: A Lot Help needed to walk in hospital room?: A Little Help needed climbing 3-5 steps with a railing? : Total 6 Click Score: 13    End of Session Equipment Utilized During Treatment: Gait belt Activity Tolerance: Patient tolerated treatment well;Patient limited by fatigue Patient left: in bed;with call bell/phone within reach;with bed alarm set Nurse Communication: Mobility status PT Visit Diagnosis: Muscle weakness (generalized) (M62.81);Difficulty in walking, not elsewhere classified (R26.2);History of falling (Z91.81);Pain Pain - Right/Left: Right Pain - part of body: Hip     Time: 8676-7209 PT Time Calculation (min) (ACUTE ONLY): 17 min  Charges:  $Therapeutic Exercise: 8-22 mins                     Olga Coaster PT, DPT 3:06 PM,12/31/19

## 2020-01-01 ENCOUNTER — Inpatient Hospital Stay: Payer: Medicare PPO

## 2020-01-01 LAB — CBC WITH DIFFERENTIAL/PLATELET
Abs Immature Granulocytes: 0.11 10*3/uL — ABNORMAL HIGH (ref 0.00–0.07)
Basophils Absolute: 0 10*3/uL (ref 0.0–0.1)
Basophils Relative: 1 %
Eosinophils Absolute: 0.2 10*3/uL (ref 0.0–0.5)
Eosinophils Relative: 2 %
HCT: 31.1 % — ABNORMAL LOW (ref 39.0–52.0)
Hemoglobin: 10.2 g/dL — ABNORMAL LOW (ref 13.0–17.0)
Immature Granulocytes: 1 %
Lymphocytes Relative: 14 %
Lymphs Abs: 1.2 10*3/uL (ref 0.7–4.0)
MCH: 29.1 pg (ref 26.0–34.0)
MCHC: 32.8 g/dL (ref 30.0–36.0)
MCV: 88.6 fL (ref 80.0–100.0)
Monocytes Absolute: 1 10*3/uL (ref 0.1–1.0)
Monocytes Relative: 11 %
Neutro Abs: 6.3 10*3/uL (ref 1.7–7.7)
Neutrophils Relative %: 71 %
Platelets: 234 10*3/uL (ref 150–400)
RBC: 3.51 MIL/uL — ABNORMAL LOW (ref 4.22–5.81)
RDW: 14.5 % (ref 11.5–15.5)
WBC: 8.8 10*3/uL (ref 4.0–10.5)
nRBC: 0 % (ref 0.0–0.2)

## 2020-01-01 LAB — BASIC METABOLIC PANEL
Anion gap: 8 (ref 5–15)
BUN: 38 mg/dL — ABNORMAL HIGH (ref 8–23)
CO2: 32 mmol/L (ref 22–32)
Calcium: 8.9 mg/dL (ref 8.9–10.3)
Chloride: 97 mmol/L — ABNORMAL LOW (ref 98–111)
Creatinine, Ser: 0.91 mg/dL (ref 0.61–1.24)
GFR calc Af Amer: >60 mL/min (ref >60)
GFR calc non Af Amer: >60 mL/min (ref >60)
Glucose, Bld: 103 mg/dL — ABNORMAL HIGH (ref 70–99)
Potassium: 3.8 mmol/L (ref 3.5–5.1)
Sodium: 137 mmol/L (ref 135–145)

## 2020-01-01 LAB — SURGICAL PATHOLOGY

## 2020-01-01 LAB — MAGNESIUM: Magnesium: 2 mg/dL (ref 1.7–2.4)

## 2020-01-01 MED ORDER — POLYETHYLENE GLYCOL 3350 17 G PO PACK
17.0000 g | PACK | Freq: Every day | ORAL | Status: DC
  Administered 2020-01-01: 17 g via ORAL
  Filled 2020-01-01 (×2): qty 1

## 2020-01-01 NOTE — Progress Notes (Signed)
PT Cancellation Note  Patient Details Name: Carlos Roach MRN: [id number] DOB: 28-May-1940   Cancelled Treatment:    Reason Eval/Treat Not Completed: Other (Pt sleeping upon PT entry, but woke to voice. Politely requested PT attempt therapy treatment later as he's had a busy morning. Will follow up with BID treat as able.)  Vira Blanco, PT, DPT 1:23 PM,01/01/20

## 2020-01-01 NOTE — Progress Notes (Signed)
Physical Therapy Treatment Patient Details Name: Carlos Roach MRN: [id number] DOB: 08/09/1940 Today's Date: 01/01/2020    History of Present Illness Pt is a 79 yo male s/p R hip hemiarthroplasty due to a fall at home on 12/21/2019. PMH of HTN, CHF, afib, etoh abuse.    PT Comments    Patient alert, agreeable to PT, denied pain initially. With RLE movement, pt did endorse tenderness of calf, no increased pain with DF but diffuse RLE swelling noted (pt does have chronic edema at baseline). Discussed findings with MD after pt mobility upon her entrance to the room, MD plans on ordering [place] to rule out DVT. Pt performed supine to sit with minA, increased assistance for trunk elevation needed. Sit <> stand variable during session, min-modA. Pt reported being unable to stand any longer to safely complete pivot to chair, lowered to chair maxA by PT and then repositioned as able. Orthostatic vitals assessed and positive results relayed to MD and RN. Pt in chair in NAD with all needs in reach. The patient would benefit from further skilled PT intervention to continue to progress towards goals. Recommendation remains appropriate.   Orthostatic vitals: Sitting: 110/70 HR 84 Standing at 0 minutes: 84/56 HR 67. Upon returning to chair and resting prior to PT exiting room BP 117/82.      Follow Up Recommendations  SNF     Equipment Recommendations  None recommended by PT;Other (comment) (TBD at next venue of care)    Recommendations for Other Services OT consult     Precautions / Restrictions Precautions Precautions: Fall;Posterior Hip Precaution Booklet Issued: No (exercises given, needs precaution packet) Restrictions Weight Bearing Restrictions: Yes RLE Weight Bearing: Weight bearing as tolerated    Mobility  Bed Mobility Overal bed mobility: Needs Assistance Bed Mobility: Sit to Supine     Supine to sit: HOB elevated;Min assist     General bed mobility comments: pt needed more  physical assist for trunk elevation than previous session  Transfers Overall transfer level: Needs assistance Equipment used: Rolling walker (2 wheeled) Transfers: Sit to/from Stand Sit to Stand: Mod assist;Min assist Stand pivot transfers: Mod assist       General transfer comment: Pt reported being unable to stand any longer to safely complete pivot to chair, lowered to chair maxA by PT and then repositioned as able. sit <> stand performed twice more this session, minA from recliner, unable to stand >30seconds.  Ambulation/Gait                 Stairs             Wheelchair Mobility    Modified Rankin (Stroke Patients Only)       Balance Overall balance assessment: Needs assistance Sitting-balance support: Feet supported Sitting balance-Leahy Scale: Good Sitting balance - Comments: able to scoot to edge of recliner and edge of bed with supervision, use of UEs   Standing balance support: Bilateral upper extremity supported;During functional activity Standing balance-Leahy Scale: Poor Standing balance comment: Min A for maintainance of upright balance during standing/ambulation                            Cognition Arousal/Alertness: Awake/alert Behavior During Therapy: WFL for tasks assessed/performed Overall Cognitive Status: Within Functional Limits for tasks assessed  General Comments: slow processing      Exercises General Exercises - Lower Extremity Ankle Circles/Pumps: AROM;Both;10 reps Quad Sets: AROM;Strengthening;Both;10 reps Short Arc Quad: AROM;Strengthening;Right;10 reps Heel Slides: AAROM;Strengthening;Right;10 reps Other Exercises Other Exercises: Orthostatic vitals assessed due to pt complaint of fatigue with upright positioning, and poor upright tolerance. see vitals flowsheets for details    General Comments        Pertinent Vitals/Pain Pain Assessment: No/denies pain     Home Living                      Prior Function            PT Goals (current goals can now be found in the care plan section) Progress towards PT goals: Progressing toward goals    Frequency    BID      PT Plan Current plan remains appropriate    Co-evaluation              AM-PAC PT "6 Clicks" Mobility   Outcome Measure  Help needed turning from your back to your side while in a flat bed without using bedrails?: A Little Help needed moving from lying on your back to sitting on the side of a flat bed without using bedrails?: A Lot Help needed moving to and from a bed to a chair (including a wheelchair)?: A Lot Help needed standing up from a chair using your arms (e.g., wheelchair or bedside chair)?: A Lot Help needed to walk in hospital room?: A Lot Help needed climbing 3-5 steps with a railing? : Total 6 Click Score: 12    End of Session Equipment Utilized During Treatment: Gait belt Activity Tolerance: Patient tolerated treatment well;Patient limited by fatigue Patient left: in bed;with call bell/phone within reach;with bed alarm set Nurse Communication: Mobility status PT Visit Diagnosis: Muscle weakness (generalized) (M62.81);Difficulty in walking, not elsewhere classified (R26.2);History of falling (Z91.81);Pain Pain - Right/Left: Right Pain - part of body: Hip     Time: 8921-1941 PT Time Calculation (min) (ACUTE ONLY): 31 min  Charges:  $Therapeutic Exercise: 23-37 mins                     Olga Coaster PT, DPT 10:12 AM,01/01/20

## 2020-01-01 NOTE — Progress Notes (Signed)
PROGRESS NOTE    Carlos Roach  YQM:[id number] DOB: 1940-05-29 DOA: 12/27/2019 PCP: Clent Jacks, PA-C   Brief Narrative:  Carlos Roach is a 79 year old Caucasian male with PMH OSA, hypertension, chronic systolic CHF, A. fib on Eliquis, alcohol use who presented to the ER after referral from [facility].  He had a mechanical fall at home on 12/21/2019 landing on his right side. He was trying to recover at home but was mostly bedbound and only getting to the chair or bathroom by pivoting and not putting much weight on his right lower extremity. He had an x-ray at the orthopedic clinic which revealed a right femoral neck fracture and was sent to the ER. Repeat x-ray performed on admission confirmed a comminuted displaced subcapital right hip fracture. Orthopedic surgery evaluated the patient and after 48 hours of holding Eliquis, patient underwent right hip hemiarthroplasty on 12/29/2019. Eliquis was resumed next day. PT is recommending rehab.  Subjective: Patient was having right leg swelling with some discomfort around calf.  No other complaint.  He just finished working with PT and appears little tired.  Assessment & Plan:   Principal Problem:   Closed displaced fracture of right femoral neck (HCC) Active Problems:   HTN (hypertension)   Atrial fibrillation (HCC)   Fall   Chronic cutaneous venous stasis ulcer (HCC)  Closed displaced fracture of right femoral neck (HCC)  s/p mechanical fall at home on 9/11; has not been able to ambulate and ultimately presented on 12/27/2019 for evaluation and was found to have fracture as noted Orthopedic surgery following, appreciate assistance.   s/p right hip hemiarthroplasty 12/29/19 -Continue pain control -PT recommending SNF placement.  Patient has a bed offer-pending insurance authorization.  Right leg edema.  Patient had more right leg edema as compared to left with mild calf tenderness.  Venous Doppler studies were obtained and they were  negative for DVT.  Postoperative anemia. Hemoglobin with slight improvement, 10.2 today. Most likely secondary to recent surgery. - anemia panel-consistent with anemia of chronic disease.  HTN (hypertension) blood pressure within goal. - continue cardizem  Atrial fibrillation (HCC) -Eliquis resumed 9/20 - continue cardizem.  Alcohol abuse. No current concern. -Continue with CIWA protocol without Ativan.  Chronic cutaneous venous stasis ulcer (HCC) - Bilateral LE  -Follows with outpatient wound center. Last note reviewed from 11/29/2019 - evaluated by WOC, appreciate recommendations while inpatient, see below: "cleansing, moisturizing (with Eucerin) and dressing with xeroform gauze topped with dry gauze prior to rapping from toes to knee with Kerlix roll gauze topped with ACE bandage. Heels are to be floated and LEs elevated."  Objective: Vitals:   01/01/20 0721 01/01/20 0857 01/01/20 0944 01/01/20 1200  BP: 95/60 (!) 105/58 117/71 (!) 94/58  Pulse: 66 81 82 87  Resp: 15 16    Temp: 98.1 F (36.7 C)     TempSrc: Oral     SpO2: 94%     Weight:      Height:        Intake/Output Summary (Last 24 hours) at 01/01/2020 1648 Last data filed at 01/01/2020 1345 Gross per 24 hour  Intake 720 ml  Output 2000 ml  Net -1280 ml   Filed Weights   12/27/19 1518  Weight: 101.6 kg    Examination:  General.  Well-developed elderly man, in no acute distress. Pulmonary.  Lungs clear bilaterally, normal respiratory effort. CV.  Regular rate and rhythm, no JVD, rub or murmur. Abdomen.  Soft, nontender, nondistended, BS positive. CNS.  Alert  and oriented x3.  No focal neurologic deficit. Extremities.  Bilateral lower extremity edema, Ace wrap in place, right worse than left with mild calf tenderness. Psychiatry.  Judgment and insight appears normal.  DVT prophylaxis: Eliquis Code Status: DNR Family Communication: Talk with son on phone. Disposition Plan:  Status is:  Inpatient  Remains inpatient appropriate because:Inpatient level of care appropriate due to severity of illness   Dispo: The patient is from: Home              Anticipated d/c is to: SNF              Anticipated d/c date is: 1 day              Patient currently is medically stable to d/c.  Patient has a bed offer, pending insurance authorization.   Consultants:   Orthopedic surgery  Procedures:  Antimicrobials:   Data Reviewed: I have personally reviewed following labs and imaging studies  CBC: Recent Labs  Lab 12/27/19 1610 12/27/19 1610 12/28/19 0525 12/29/19 0426 12/30/19 0534 12/31/19 0532 01/01/20 0418  WBC 8.7   < > 6.6 7.2 10.7* 9.8 8.8  NEUTROABS 6.6  --   --  5.1 9.2* 7.7 6.3  HGB 13.7   < > 12.4* 12.5* 11.6* 10.8* 10.2*  HCT 40.0   < > 36.8* 38.8* 33.9* 32.2* 31.1*  MCV 85.3   < > 86.8 89.0 86.0 86.1 88.6  PLT 241   < > 222 251 245 245 234   < > = values in this interval not displayed.   Basic Metabolic Panel: Recent Labs  Lab 12/28/19 0525 12/29/19 0426 12/30/19 0534 12/31/19 0532 01/01/20 0418  NA 136 136 136 136 137  K 3.8 4.3 4.1 3.9 3.8  CL 98 97* 97* 98 97*  CO2 28 31 29 29  32  GLUCOSE 100* 106* 139* 111* 103*  BUN 28* 32* 35* 39* 38*  CREATININE 0.75 0.95 1.15 0.93 0.91  CALCIUM 9.1 9.2 8.9 8.7* 8.9  MG  --  2.2 2.1 2.1 2.0   GFR: Estimated Creatinine Clearance: 83.8 mL/min (by C-G formula based on SCr of 0.91 mg/dL). Liver Function Tests: Recent Labs  Lab 12/27/19 1610  AST 29  ALT 24  ALKPHOS 96  BILITOT 1.9*  PROT 8.2*  ALBUMIN 3.4*   No results for input(s): LIPASE, AMYLASE in the last 168 hours. No results for input(s): AMMONIA in the last 168 hours. Coagulation Profile: Recent Labs  Lab 12/27/19 1610  INR 1.3*   Cardiac Enzymes: No results for input(s): CKTOTAL, CKMB, CKMBINDEX, TROPONINI in the last 168 hours. BNP (last 3 results) No results for input(s): PROBNP in the last 8760 hours. HbA1C: No results for  input(s): HGBA1C in the last 72 hours. CBG: No results for input(s): GLUCAP in the last 168 hours. Lipid Profile: No results for input(s): CHOL, HDL, LDLCALC, TRIG, CHOLHDL, LDLDIRECT in the last 72 hours. Thyroid Function Tests: No results for input(s): TSH, T4TOTAL, FREET4, T3FREE, THYROIDAB in the last 72 hours. Anemia Panel: Recent Labs    12/31/19 0532  VITAMINB12 564  FOLATE 20.5  FERRITIN 573*  TIBC 190*  IRON 55  RETICCTPCT 1.4   Sepsis Labs: No results for input(s): PROCALCITON, LATICACIDVEN in the last 168 hours.  Recent Results (from the past 240 hour(s))  SARS Coronavirus 2 by RT PCR (hospital order, performed in [facility] hospital lab) Nasopharyngeal Nasopharyngeal Swab     Status: None   Collection Time: 12/27/19  4:10 PM   Specimen: Nasopharyngeal Swab  Result Value Ref Range Status   SARS Coronavirus 2 NEGATIVE NEGATIVE Final    Comment: (NOTE) SARS-CoV-2 target nucleic acids are NOT DETECTED.  The SARS-CoV-2 RNA is generally detectable in upper and lower respiratory specimens during the acute phase of infection. The lowest concentration of SARS-CoV-2 viral copies this assay can detect is 250 copies / mL. A negative result does not preclude SARS-CoV-2 infection and should not be used as the sole basis for treatment or other patient management decisions.  A negative result may occur with improper specimen collection / handling, submission of specimen other than nasopharyngeal swab, presence of viral mutation(s) within the areas targeted by this assay, and inadequate number of viral copies (<250 copies / mL). A negative result must be combined with clinical observations, patient history, and epidemiological information.  Fact Sheet for Patients:   BoilerBrush.com.cy  Fact Sheet for Healthcare Providers: https://pope.com/  This test is not yet approved or  cleared by the [place] FDA and has been  authorized for detection and/or diagnosis of SARS-CoV-2 by FDA under an Emergency Use Authorization (EUA).  This EUA will remain in effect (meaning this test can be used) for the duration of the COVID-19 declaration under Section 564(b)(1) of the Act, 21 U.S.C. section 360bbb-3(b)(1), unless the authorization is terminated or revoked sooner.  Performed at [facility], [address] [place], [place] [address]      Radiology Studies: US Venous Img Lower Unilateral Right (DVT)  Result Date: 01/01/2020 CLINICAL DATA:  Right lower extremity edema, recent fall, status post right hip arthroplasty 12/29/2019 EXAM: RIGHT LOWER EXTREMITY VENOUS DOPPLER ULTRASOUND TECHNIQUE: Gray-scale sonography with graded compression, as well as color Doppler and duplex ultrasound were performed to evaluate the lower extremity deep venous systems from the level of the common femoral vein and including the common femoral, femoral, profunda femoral, popliteal and calf veins including the posterior tibial, peroneal and gastrocnemius veins when visible. The superficial great saphenous vein was also interrogated. Spectral Doppler was utilized to evaluate flow at rest and with distal augmentation maneuvers in the common femoral, femoral and popliteal veins. COMPARISON:  None. FINDINGS: Contralateral Common Femoral Vein: Respiratory phasicity is normal and symmetric with the symptomatic side. No evidence of thrombus. Normal compressibility. Common Femoral Vein: No evidence of thrombus. Normal compressibility, respiratory phasicity and response to augmentation. Saphenofemoral Junction: No evidence of thrombus. Normal compressibility and flow on color Doppler imaging. Profunda Femoral Vein: No evidence of thrombus. Normal compressibility and flow on color Doppler imaging. Femoral Vein: No evidence of thrombus. Normal compressibility, respiratory phasicity and response to augmentation. Popliteal Vein: No evidence of  thrombus. Normal compressibility, respiratory phasicity and response to augmentation. Calf Veins: No evidence of thrombus. Normal compressibility and flow on color Doppler imaging. IMPRESSION: No evidence of deep venous thrombosis. Electronically Signed   By: Judie Petit.  Shick M.D.   On: 01/01/2020 10:56    Scheduled Meds: . apixaban  5 mg Oral BID  . diltiazem  120 mg Oral Daily  . docusate sodium  100 mg Oral BID  . feeding supplement  237 mL Oral BID BM  . ferrous sulfate  325 mg Oral BID WC  . folic acid  500 mcg Oral Daily  . hydrocerin   Topical Daily  . melatonin  2.5 mg Oral QHS  . metoprolol succinate  50 mg Oral Daily  . multivitamin with minerals  1 tablet Oral Daily  . omega-3 acid ethyl esters  1 g Oral BID  . polyethylene glycol  17 g Oral Daily  . potassium chloride SA  20 mEq Oral BID  . simvastatin  40 mg Oral Daily  . tamsulosin  0.4 mg Oral QPC breakfast  . thiamine  100 mg Oral Daily  . torsemide  20 mg Oral BID  . traMADol  50 mg Oral Q6H  . vitamin B-12  500 mcg Oral Daily   Continuous Infusions: . sodium chloride 75 mL/hr at 12/30/19 0211  . methocarbamol (ROBAXIN) IV       LOS: 5 days   Time spent: 30 minutes.  Arnetha CourserSumayya Emani Morad, MD [facility]  If 7PM-7AM, please contact night-coverage [email or web address]  01/01/2020, 4:48 PM   This record has been created using Conservation officer, historic buildingsDragon voice recognition software. Errors have been sought and corrected,but may not always be located. Such creation errors do not reflect on the standard of care.

## 2020-01-01 NOTE — Progress Notes (Signed)
Physical Therapy Treatment Patient Details Name: Carlos Roach MRN: [id number] DOB: December 21, 1940 Today's Date: 01/01/2020    History of Present Illness Pt is a 79 yo male s/p R hip hemiarthroplasty due to a fall at home on 12/21/2019. PMH of HTN, CHF, afib, etoh abuse.    PT Comments    Pt seated in chair upon PT entry. Pt tolerated treatment well today, but further mobility continues to be limited secondary to weakness and orthostatic hypotension (please see vitals below). While pt continues to require increased assistance for STS transfers with RW, he was able to improve upright tolerance/activity tolerance since morning session for BP testing. Pt required mod-max assist to perform RLE exercises including SLR and heel slides secondary to pain and weakness. Pt able to recall 3/3 posterior hip precautions without cueing. Pt will continue to benefit from skilled acute PT services to address deficits for return to baseline function. Will continue to recommend SNF at DC.  BP Vitals: Seated: 96/55 mmHg Standing: 80/41 mmHg Standing (after ): 80/51 mmHg Reclined in chair: 101/74 mmHg   Follow Up Recommendations  SNF     Equipment Recommendations  None recommended by PT;Other (comment) (TBD at next venue of care)    Recommendations for Other Services       Precautions / Restrictions Precautions Precautions: Fall;Posterior Hip Precaution Booklet Issued: Yes (comment) Restrictions Weight Bearing Restrictions: Yes RLE Weight Bearing: Weight bearing as tolerated    Mobility  Bed Mobility                  Transfers Overall transfer level: Needs assistance Equipment used: Rolling walker (2 wheeled) Transfers: Sit to/from Stand;Anterior-Posterior Transfer Sit to Stand: Mod assist     Anterior-Posterior transfers: Supervision   General transfer comment: Pt required mod assist for power to stand from recliner with RW. Verbal cues provided for hand placement and RLE  placement for pain management and safety with transfer. Pt supervision to scoot ant/post in recliner with use of BUE for support.  Ambulation/Gait             General Gait Details: deffered secondary to pt [facility]   Stairs             Wheelchair Mobility    Modified Rankin (Stroke Patients Only)       Balance Overall balance assessment: Needs assistance Sitting-balance support: Feet supported Sitting balance-Leahy Scale: Good Sitting balance - Comments: able to scoot to edge of recliner and edge of bed with supervision, use of UEs   Standing balance support: Bilateral upper extremity supported;During functional activity Standing balance-Leahy Scale: Poor Standing balance comment: Fluctuating assistance levels of min assist - CGA for maintenance of upright balance during standing. Increased assistance required with unilateral UE support on RW during BP testing.                            Cognition Arousal/Alertness: Awake/alert Behavior During Therapy: WFL for tasks assessed/performed Overall Cognitive Status: Within Functional Limits for tasks assessed                                 General Comments: slow processing      Exercises Other Exercises Other Exercises: Pt able to tolerate ~49min of static standing with RW for orthostatic BP vitals. Other Exercises: Pt able to complete x15 reps on BLE of the following exercises: hip ABD/ADD, glute sets,  ankle pumps, heel slides, and SLR. Pt required mod-max assist for RLE SLR and heel slides secondary to weakness and increased pain.    General Comments        Pertinent Vitals/Pain Pain Assessment: No/denies pain    Home Living                      Prior Function            PT Goals (current goals can now be found in the care plan section) Progress towards PT goals: Progressing toward goals    Frequency    BID      PT Plan Current plan remains appropriate     Co-evaluation              AM-PAC PT "6 Clicks" Mobility   Outcome Measure  Help needed turning from your back to your side while in a flat bed without using bedrails?: A Little Help needed moving from lying on your back to sitting on the side of a flat bed without using bedrails?: A Lot Help needed moving to and from a bed to a chair (including a wheelchair)?: A Lot Help needed standing up from a chair using your arms (e.g., wheelchair or bedside chair)?: A Lot Help needed to walk in hospital room?: A Lot Help needed climbing 3-5 steps with a railing? : Total 6 Click Score: 12    End of Session Equipment Utilized During Treatment: Gait belt Activity Tolerance: Patient tolerated treatment well;Patient limited by fatigue Patient left: with call bell/phone within reach;in chair;with chair alarm set Nurse Communication: Mobility status;Other (comment) ([place]) PT Visit Diagnosis: Muscle weakness (generalized) (M62.81);Difficulty in walking, not elsewhere classified (R26.2);History of falling (Z91.81);Pain Pain - Right/Left: Right Pain - part of body: Hip     Time: 0017-4944 PT Time Calculation (min) (ACUTE ONLY): 28 min  Charges:  $Therapeutic Exercise: 8-22 mins $Therapeutic Activity: 8-22 mins                     Vira Blanco, PT, DPT 3:42 PM,01/01/20

## 2020-01-01 NOTE — TOC Progression Note (Signed)
Transition of Care [facility]) - Progression Note    Patient Details  Name: Carlos Roach MRN: [id number] Date of Birth: 28-Nov-1940  Transition of Care [facility]) CM/SW [place], RN Phone Number: 01/01/2020, 4:21 PM  Clinical Narrative:   RNCM met with patient at bedside to present bed offers. Patient reports that he is not familiar with any of the facilities. Medicare.gov rating provided for his review and after discussion he was agreeable to placement at [facility]. RNCM accepted bed in the hub and started insurance auth through the [employer] portal.     Expected Discharge Plan: [place] Barriers to Discharge: No Barriers Identified  Expected Discharge Plan and Services Expected Discharge Plan: [place]   Discharge Planning Services: CM Consult Post Acute Care Choice: [place] Living arrangements for the past 2 months: Single Family Home                                       Social Determinants of Health (SDOH) Interventions    Readmission Risk Interventions No flowsheet data found.

## 2020-01-01 NOTE — Progress Notes (Signed)
Subjective: 3 Days Post-Op Procedure(s) (LRB): ARTHROPLASTY BIPOLAR HIP (HEMIARTHROPLASTY) (Right) Patient reports pain as mild.   Patient is well, and has had no acute complaints or problems Plan is to go Rehab after hospital stay. Negative for chest pain and shortness of breath Fever: no Gastrointestinal: Negative for nausea and vomiting  Objective: Vital signs in last 24 hours: Temp:  [97.6 F (36.4 C)-98.1 F (36.7 C)] 98.1 F (36.7 C) (09/22 0721) Pulse Rate:  [66-88] 66 (09/22 0721) Resp:  [15-17] 15 (09/22 0721) BP: (94-111)/(59-65) 95/60 (09/22 0721) SpO2:  [93 %-97 %] 94 % (09/22 0721)  Intake/Output from previous day:  Intake/Output Summary (Last 24 hours) at 01/01/2020 0821 Last data filed at 01/01/2020 1601 Gross per 24 hour  Intake 720 ml  Output 2000 ml  Net -1280 ml    Intake/Output this shift: No intake/output data recorded.  Labs: Recent Labs    12/30/19 0534 12/31/19 0532 01/01/20 0418  HGB 11.6* 10.8* 10.2*   Recent Labs    12/31/19 0532 01/01/20 0418  WBC 9.8 8.8  RBC 3.74*  3.72* 3.51*  HCT 32.2* 31.1*  PLT 245 234   Recent Labs    12/31/19 0532 01/01/20 0418  NA 136 137  K 3.9 3.8  CL 98 97*  CO2 29 32  BUN 39* 38*  CREATININE 0.93 0.91  GLUCOSE 111* 103*  CALCIUM 8.7* 8.9   No results for input(s): LABPT, INR in the last 72 hours.   EXAM General - Patient is Alert, Appropriate and Oriented Extremity - Neurovascular intact Sensation intact distally Dorsiflexion/Plantar flexion intact Dressing/Incision - Moderate blood tinged drainage to the right hip this AM. Motor Function - intact, moving foot and toes well on exam.  Unna boots intact to lower extremities.  Past Medical History:  Diagnosis Date  . Alcohol use   . Arrhythmia    atrial fibrillation  . Cellulitis   . Chronic atrial fibrillation (HCC)    a. CHADS2VASc 5 (CHF, HTN, age x 2, vascular disease)  . Chronic systolic CHF (congestive heart failure) (HCC)     a. TTE 1/19: EF 50%, mild LVH, trivial MR/PR/TR, small pericardial effusion  . Chronic venous stasis   . GI bleed   . Hypertension   . Obstructive sleep apnea     Assessment/Plan: 3 Days Post-Op Procedure(s) (LRB): ARTHROPLASTY BIPOLAR HIP (HEMIARTHROPLASTY) (Right) Principal Problem:   Closed displaced fracture of right femoral neck (HCC) Active Problems:   HTN (hypertension)   Atrial fibrillation (HCC)   Fall   Chronic cutaneous venous stasis ulcer (HCC)  Estimated body mass index is 30.38 kg/m as calculated from the following:   Height as of this encounter: 6' (1.829 m).   Weight as of this encounter: 101.6 kg. Advance diet Up with therapy D/C IV fluids   Labs reviewed this AM. Patient has had a BM. Plan is for discharge to SNF. Continue with PT today patient has not been able to walk a significant distance with PT.  Upon discharge, patient will need to follow-up with [facility] in 10-14 days for staple removal.  DVT Prophylaxis - TED hose and Eliquis Weight-Bearing as tolerated to right leg  J. Horris Latino, PA-C Orthopaedic Surgery 01/01/2020, 8:21 AM

## 2020-01-02 LAB — CBC WITH DIFFERENTIAL/PLATELET
Abs Immature Granulocytes: 0.08 10*3/uL — ABNORMAL HIGH (ref 0.00–0.07)
Basophils Absolute: 0.1 10*3/uL (ref 0.0–0.1)
Basophils Relative: 1 %
Eosinophils Absolute: 0.3 10*3/uL (ref 0.0–0.5)
Eosinophils Relative: 3 %
HCT: 30.1 % — ABNORMAL LOW (ref 39.0–52.0)
Hemoglobin: 10 g/dL — ABNORMAL LOW (ref 13.0–17.0)
Immature Granulocytes: 1 %
Lymphocytes Relative: 14 %
Lymphs Abs: 1.2 10*3/uL (ref 0.7–4.0)
MCH: 29.6 pg (ref 26.0–34.0)
MCHC: 33.2 g/dL (ref 30.0–36.0)
MCV: 89.1 fL (ref 80.0–100.0)
Monocytes Absolute: 0.8 10*3/uL (ref 0.1–1.0)
Monocytes Relative: 10 %
Neutro Abs: 5.8 10*3/uL (ref 1.7–7.7)
Neutrophils Relative %: 71 %
Platelets: 242 10*3/uL (ref 150–400)
RBC: 3.38 MIL/uL — ABNORMAL LOW (ref 4.22–5.81)
RDW: 14.6 % (ref 11.5–15.5)
WBC: 8.1 10*3/uL (ref 4.0–10.5)
nRBC: 0 % (ref 0.0–0.2)

## 2020-01-02 LAB — RESPIRATORY PANEL BY RT PCR (FLU A&B, COVID)
Influenza A by PCR: NEGATIVE
Influenza B by PCR: NEGATIVE
SARS Coronavirus 2 by RT PCR: NEGATIVE

## 2020-01-02 LAB — BASIC METABOLIC PANEL
Anion gap: 7 (ref 5–15)
BUN: 32 mg/dL — ABNORMAL HIGH (ref 8–23)
CO2: 34 mmol/L — ABNORMAL HIGH (ref 22–32)
Calcium: 8.8 mg/dL — ABNORMAL LOW (ref 8.9–10.3)
Chloride: 96 mmol/L — ABNORMAL LOW (ref 98–111)
Creatinine, Ser: 0.8 mg/dL (ref 0.61–1.24)
GFR calc Af Amer: >60 mL/min (ref >60)
GFR calc non Af Amer: >60 mL/min (ref >60)
Glucose, Bld: 97 mg/dL (ref 70–99)
Potassium: 3.5 mmol/L (ref 3.5–5.1)
Sodium: 137 mmol/L (ref 135–145)

## 2020-01-02 LAB — MAGNESIUM: Magnesium: 2.1 mg/dL (ref 1.7–2.4)

## 2020-01-02 MED ORDER — POLYETHYLENE GLYCOL 3350 17 G PO PACK: 17.0000 g | PACK | Freq: Every day | ORAL | 0 refills | Status: AC | PRN

## 2020-01-02 MED ORDER — THIAMINE HCL 100 MG PO TABS: 100.0000 mg | ORAL_TABLET | Freq: Every day | ORAL | Status: AC

## 2020-01-02 MED ORDER — HYDROCERIN EX CREA: 1.0000 "application " | TOPICAL_CREAM | Freq: Every day | CUTANEOUS | 0 refills | Status: AC

## 2020-01-02 MED ORDER — ENSURE SURGERY PO LIQD: 237.0000 mL | Freq: Two times a day (BID) | ORAL | Status: AC

## 2020-01-02 MED ORDER — MELATONIN 3 MG PO TBDP: 1.0000 | ORAL_TABLET | Freq: Every day | ORAL | Status: AC

## 2020-01-02 MED ORDER — METHOCARBAMOL 500 MG PO TABS: 500.0000 mg | ORAL_TABLET | Freq: Four times a day (QID) | ORAL | Status: AC | PRN

## 2020-01-02 MED ORDER — TAMSULOSIN HCL 0.4 MG PO CAPS: 0.4000 mg | ORAL_CAPSULE | Freq: Every day | ORAL | Status: AC

## 2020-01-02 NOTE — Plan of Care (Signed)
  Problem: Education: Goal: Knowledge of General Education information will improve Description: Including pain rating scale, medication(s)/side effects and non-pharmacologic comfort measures Outcome: Progressing   Problem: Health Behavior/Discharge Planning: Goal: Ability to manage health-related needs will improve Outcome: Progressing   Problem: Clinical Measurements: Goal: Ability to maintain clinical measurements within normal limits will improve Outcome: Progressing Goal: Will remain free from infection Outcome: Progressing Goal: Respiratory complications will improve Outcome: Progressing   Problem: Activity: Goal: Risk for activity intolerance will decrease Outcome: Progressing   Problem: Nutrition: Goal: Adequate nutrition will be maintained Outcome: Progressing   Problem: Coping: Goal: Level of anxiety will decrease Outcome: Progressing   Problem: Elimination: Goal: Will not experience complications related to bowel motility Outcome: Progressing Goal: Will not experience complications related to urinary retention Outcome: Progressing   Problem: Pain Managment: Goal: General experience of comfort will improve Outcome: Progressing   Problem: Safety: Goal: Ability to remain free from injury will improve Outcome: Progressing   Problem: Skin Integrity: Goal: Risk for impaired skin integrity will decrease Outcome: Progressing   

## 2020-01-02 NOTE — Care Management Important Message (Signed)
Important Message  Patient Details  Name: Carlos Roach MRN: [id number] Date of Birth: 11/24/1940   Medicare Important Message Given:  Other (see comment)  Patient discharged prior to arrival to unit to deliver concurrent Medicare IM.    [name] 01/02/2020, 1:56 PM

## 2020-01-02 NOTE — Progress Notes (Signed)
Report called to [facility] at this time to [place] -

## 2020-01-02 NOTE — Progress Notes (Addendum)
Physical Therapy Treatment Patient Details Name: Carlos Roach MRN: [id number] DOB: 01/17/1941 Today's Date: 01/02/2020    History of Present Illness Pt is a 79 yo male s/p R hip hemiarthroplasty due to a fall at home on 12/21/2019. PMH of HTN, CHF, afib, etoh abuse.    PT Comments    Pt tolerated treatment well today. Pt is making appropriate progress towards goals but continues to be limited due to fatigue, generalized weakness, and orthostasis (please see vitals below). While pt has made improvements in activity tolerance and assistance levels with functional mobility since last therapy session, he continues to require mod assist for bed mobility, mod assist for STS transfers with RW, and CGA for short distance ambulation with RW. Pt demonstrates slowed cadence, decreased step length bil, and foot clearance R>L during gait with RW, which increases his risk of falls. Pt able to perform BLE exercises without additional assistance from PT. Pt able to independently recall 3/3 posterior hip precautions. Pt will continue to benefit from acute skilled PT services to address deficits for return to baseline function.  Orthostatics: Supine: 103/65 mmHg Seated: 101/50 mmHg Standing: 78/50 mmHg Seated: 90/56 mmHg Reclined (after ): 114/79 mmHg    Follow Up Recommendations  SNF     Equipment Recommendations  None recommended by PT;Other (comment) (will defer to postacute facility)    Recommendations for Other Services       Precautions / Restrictions Precautions Precautions: Fall;Posterior Hip Precaution Booklet Issued: Yes (comment) Restrictions Weight Bearing Restrictions: Yes RLE Weight Bearing: Weight bearing as tolerated    Mobility  Bed Mobility Overal bed mobility: Needs Assistance Bed Mobility: Sit to Supine     Supine to sit: HOB elevated;Mod assist     General bed mobility comments: Mod assist for trunk facilitation to sit EOB; verbal/tactile cues provided for BLE  facilitation, hand placement, and sequencing. Pt required increased time/effort to perform mobility.  Transfers Overall transfer level: Needs assistance Equipment used: Rolling walker (2 wheeled) Transfers: Sit to/from Stand;Anterior-Posterior Transfer Sit to Stand: Mod assist     Anterior-Posterior transfers: Supervision   General transfer comment: Pt required mod assist for power to stand from EOB with RW. Verbal cues provided for hand placement and RLE placement for pain management and safety with transfer. Pt supervision to scoot post in recliner with use of BUE for support.  Ambulation/Gait Ambulation/Gait assistance: Min guard Gait Distance (Feet): 2 Feet Assistive device: Rolling walker (2 wheeled)       General Gait Details: Pt able to take ~5-6 steps from EOB>recliner with RW. Pt demonstrated step to gait pattern, decreased step length bil, decreased foot clearance R>L, slowed cadence and forward flexed posture.   Stairs             Wheelchair Mobility    Modified Rankin (Stroke Patients Only)       Balance Overall balance assessment: Needs assistance Sitting-balance support: Feet supported Sitting balance-Leahy Scale: Good Sitting balance - Comments: able to scoot anteriorly to EOB and posteriorly in recliner with supervision, use of UEs   Standing balance support: Bilateral upper extremity supported;During functional activity Standing balance-Leahy Scale: Poor Standing balance comment: Pt requires CGA for safety for static/dynamic standing in RW and BUE support.                            Cognition Arousal/Alertness: Awake/alert Behavior During Therapy: WFL for tasks assessed/performed Overall Cognitive Status: Within Functional Limits for tasks assessed  General Comments: slow processing      Exercises General Exercises - Lower Extremity Ankle Circles/Pumps: AROM;Both;10 reps Quad Sets:  AROM;Strengthening;Both;10 reps Gluteal Sets: AROM;Both;10 reps Heel Slides: AAROM;Strengthening;10 reps;Both Hip ABduction/ADduction: AROM;10 reps;Both Other Exercises Other Exercises: Orthostatic vitals assessed; pt able to maintain static standing in RW for ~46min with CGA for safety    General Comments        Pertinent Vitals/Pain Pain Assessment: Faces Faces Pain Scale: Hurts even more Pain Location: R hip with activity    Home Living                      Prior Function            PT Goals (current goals can now be found in the care plan section) Progress towards PT goals: Progressing toward goals    Frequency    BID      PT Plan Current plan remains appropriate    Co-evaluation              AM-PAC PT "6 Clicks" Mobility   Outcome Measure  Help needed turning from your back to your side while in a flat bed without using bedrails?: A Little Help needed moving from lying on your back to sitting on the side of a flat bed without using bedrails?: A Lot Help needed moving to and from a bed to a chair (including a wheelchair)?: A Little Help needed standing up from a chair using your arms (e.g., wheelchair or bedside chair)?: A Lot Help needed to walk in hospital room?: A Little Help needed climbing 3-5 steps with a railing? : Total 6 Click Score: 14    End of Session Equipment Utilized During Treatment: Gait belt Activity Tolerance: Patient tolerated treatment well;Patient limited by fatigue Patient left: with call bell/phone within reach;in chair;with chair alarm set Nurse Communication: Mobility status PT Visit Diagnosis: Muscle weakness (generalized) (M62.81);Difficulty in walking, not elsewhere classified (R26.2);History of falling (Z91.81);Pain Pain - Right/Left: Right Pain - part of body: Hip     Time: 9480-1655 PT Time Calculation (min) (ACUTE ONLY): 25 min  Charges:  $Therapeutic Exercise: 8-22 mins $Therapeutic Activity: 8-22  mins                    Vira Blanco, PT, DPT 10:43 AM,01/02/20

## 2020-01-02 NOTE — Discharge Summary (Signed)
Physician Discharge Summary  Carlos Roach UXN:[id number] DOB: Mar 15, 1941 DOA: 12/27/2019  PCP: Clent Jacks, PA-C  Admit date: 12/27/2019 Discharge date: 01/02/2020  Admitted From: Home Disposition:  SNF  Recommendations for Outpatient Follow-up:  1. Follow up with PCP in 1-2 weeks 2. Follow-up with orthopedic surgery. 3. Please obtain BMP/CBC in one week Please follow up on the following pending results: None  Home Health: No Equipment/Devices: Rolling walker Discharge Condition: Stable CODE STATUS: DNR Diet recommendation: Heart Healthy / Carb Modified   Brief/Interim Summary: Carlos Roach is a 79 year old Caucasian male with PMH OSA, hypertension, chronic systolic CHF, A. fib on Eliquis, alcohol use who presented to the ER after referral from [facility].  He had a mechanical fall at home on 12/21/2019 landing on his right side. He was trying to recover at home but was mostly bedbound and only getting to the chair or bathroom by pivoting and not putting much weight on his right lower extremity. He had an x-ray at the orthopedic clinic which revealed a right femoral neck fracture and was sent to the ER. Repeat x-ray performed on admission confirmed a comminuted displaced subcapital right hip fracture. Orthopedic surgery evaluated the patient and after 48 hours of holding Eliquis, patient underwent right hip hemiarthroplasty on 12/29/2019. Eliquis was resumed next day. PT is recommending rehab.  And patient is being discharged to rehab to regain his strength before returning home.  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  Please follow the instructions for wound care and continue with Ace bandage.  Patient can resume his wound care on discharge. "cleansing, moisturizing (with Eucerin) and dressing with xeroform gauze topped with dry gauze prior to rapping from toes to knee with Kerlix roll gauze topped with ACE bandage. Heels are to be floated and  LEs elevated."  Patient did develop right leg edema more than left along with mild calf tenderness.  Venous Doppler studies were obtained and they were negative for DVT.  Patient has a drop in his hemoglobin postoperatively.  Anemia panel was consistent with anemia of chronic disease.  Patient was already on supplements which includes iron, folic acid and B12 and he will continue with them.  Patient has an history of atrial fibrillation and will continue with Cardizem, metoprolol and Eliquis.  He also takes torsemide for lower extremity edema.  One-time dose of torsemide and Cardizem was held due to softer blood pressure.  He will continue with his home meds.  He will need a blood pressure check before getting his meds.  If it is soft please hold for a day.  Patient will follow up with his orthopedic surgeon and primary care provider for further management.  Discharge Diagnoses:  Principal Problem:   Closed displaced fracture of right femoral neck (HCC) Active Problems:   HTN (hypertension)   Atrial fibrillation (HCC)   Fall   Chronic cutaneous venous stasis ulcer (HCC)   Discharge Instructions  Discharge Instructions    Diet - low sodium heart healthy   Complete by: As directed    Discharge wound care:   Complete by: As directed    Wound care to bilateral LEs:  Wash LEs with soap and water, rinse and pat dry. Apply thin, layer of Eucerin cream to legs and feet, but not between toes. Cover partial thickness wounds to the right lateral knee and left anterior pretibial area just below knee with xeroform gauze Hart Rochester # [id number])  Top with dry gauze. Wrap legs from  just below toes to just below knees with kerlix roll gauze. Top with ACE bandage wrapped in a similar manner. Change daily. May reuse ACE bandages for 3 days unless soiled. Float heels.   Increase activity slowly   Complete by: As directed      Allergies as of 01/02/2020      Reactions   Mucinex [guaifenesin Er] Rash       Medication List    TAKE these medications   diltiazem 120 MG 24 hr capsule Commonly known as: CARDIZEM CD Take 1 capsule (120 mg total) by mouth daily.   Eliquis 5 MG Tabs tablet Generic drug: apixaban Take 5 mg by mouth 2 (two) times daily.   feeding supplement Liqd Take 237 mLs by mouth 2 (two) times daily between meals.   FeroSul 325 (65 FE) MG tablet Generic drug: ferrous sulfate Take 325 mg by mouth daily.   Fish Oil 1000 MG Caps Take 1,000-2,000 mg by mouth See admin instructions. Take 2 capsules (2000mg ) by mouth every morning and and take 1 capsule (1000mg ) by mouth every night   folic acid 400 MCG tablet Commonly known as: FOLVITE Take 400 mcg by mouth daily.   hydrocerin Crea Apply 1 application topically daily.   Melatonin 3 MG Tbdp Take 1 tablet by mouth at bedtime.   methocarbamol 500 MG tablet Commonly known as: ROBAXIN Take 1 tablet (500 mg total) by mouth every 6 (six) hours as needed for muscle spasms.   metoprolol succinate 50 MG 24 hr tablet Commonly known as: TOPROL-XL Take 50 mg by mouth every evening.   multivitamin tablet Take 1 tablet by mouth daily.   polyethylene glycol 17 g packet Commonly known as: MIRALAX / GLYCOLAX Take 17 g by mouth daily as needed for mild constipation.   potassium chloride SA 20 MEQ tablet Commonly known as: KLOR-CON Take 40 mEq by mouth daily.   simvastatin 40 MG tablet Commonly known as: ZOCOR Take 40 mg by mouth at bedtime.   tamsulosin 0.4 MG Caps capsule Commonly known as: FLOMAX Take 1 capsule (0.4 mg total) by mouth daily after breakfast.   thiamine 100 MG tablet Take 1 tablet (100 mg total) by mouth daily.   torsemide 20 MG tablet Commonly known as: DEMADEX Take 60 mg by mouth daily.   traMADol 50 MG tablet Commonly known as: ULTRAM Take 1-2 tablets (50-100 mg total) by mouth every 6 (six) hours.   vitamin B-12 500 MCG tablet Commonly known as: CYANOCOBALAMIN Take 500 mcg by mouth  daily.            Discharge Care Instructions  (From admission, onward)         Start     Ordered   01/02/20 0000  Discharge wound care:       Comments: Wound care to bilateral LEs:  Wash LEs with soap and water, rinse and pat dry. Apply thin, layer of Eucerin cream to legs and feet, but not between toes. Cover partial thickness wounds to the right lateral knee and left anterior pretibial area just below knee with xeroform gauze # 294)  Top with dry gauze. Wrap legs from just below toes to just below knees with kerlix roll gauze. Top with ACE bandage wrapped in a similar manner. Change daily. May reuse ACE bandages for 3 days unless soiled. Float heels.   01/02/20 1041          Contact information for follow-up providers    Hart Rochester,  PA-C Follow up in 14 day(s).   Specialty: Physician Assistant Why: Mindi SlickerStaple Removal. Contact information: [address] [facility] [place] [address] [phone]        Clent Jacksovington, Sarah, PA-C. Schedule an appointment as soon as possible for a visit.   Specialty: Physician Assistant Contact information: [address] [facility] [place] [address] [phone]            Contact information for after-discharge care    Destination    [facility] .   Service: Skilled Nursing Contact information: [address] [place] [id number] [phone]                 Allergies  Allergen Reactions   Mucinex [Guaifenesin Er] Rash    Consultations:  Orthopedic surgery  Procedures/Studies: DG Chest 1 View  Result Date: 12/27/2019 CLINICAL DATA:  Right hip fracture. EXAM: CHEST  1 VIEW COMPARISON:  December 13, 2018 FINDINGS: Opacity in left retrocardiac region is unchanged since December 13, 2018. There is suggestion of a small left pleural effusion versus pleural thickening. Stable cardiomegaly. No other changes. IMPRESSION: Persistent opacity and small  effusion in the left base, unchanged since December 13, 2018. No acute interval changes. Electronically Signed   By: Gerome Samavid  Williams III M.D   On: 12/27/2019 17:24   US Venous Img Lower Unilateral Right (DVT)  Result Date: 01/01/2020 CLINICAL DATA:  Right lower extremity edema, recent fall, status post right hip arthroplasty 12/29/2019 EXAM: RIGHT LOWER EXTREMITY VENOUS DOPPLER ULTRASOUND TECHNIQUE: Gray-scale sonography with graded compression, as well as color Doppler and duplex ultrasound were performed to evaluate the lower extremity deep venous systems from the level of the common femoral vein and including the common femoral, femoral, profunda femoral, popliteal and calf veins including the posterior tibial, peroneal and gastrocnemius veins when visible. The superficial great saphenous vein was also interrogated. Spectral Doppler was utilized to evaluate flow at rest and with distal augmentation maneuvers in the common femoral, femoral and popliteal veins. COMPARISON:  None. FINDINGS: Contralateral Common Femoral Vein: Respiratory phasicity is normal and symmetric with the symptomatic side. No evidence of thrombus. Normal compressibility. Common Femoral Vein: No evidence of thrombus. Normal compressibility, respiratory phasicity and response to augmentation. Saphenofemoral Junction: No evidence of thrombus. Normal compressibility and flow on color Doppler imaging. Profunda Femoral Vein: No evidence of thrombus. Normal compressibility and flow on color Doppler imaging. Femoral Vein: No evidence of thrombus. Normal compressibility, respiratory phasicity and response to augmentation. Popliteal Vein: No evidence of thrombus. Normal compressibility, respiratory phasicity and response to augmentation. Calf Veins: No evidence of thrombus. Normal compressibility and flow on color Doppler imaging. IMPRESSION: No evidence of deep venous thrombosis. Electronically Signed   By: Judie PetitM.  Shick M.D.   On: 01/01/2020 10:56    DG HIP UNILAT W OR W/O PELVIS 2-3 VIEWS RIGHT  Result Date: 12/29/2019 CLINICAL DATA:  Right hip arthroplasty. EXAM: DG HIP (WITH OR WITHOUT PELVIS) 2-3V RIGHT COMPARISON:  12/27/2019 FINDINGS: Expected changes post right hip arthroplasty with prosthesis intact and normally located. Remainder of the exam is unchanged. IMPRESSION: Expected changes post right hip arthroplasty. Electronically Signed   By: Elberta Fortisaniel  Boyle M.D.   On: 12/29/2019 11:48   DG Hip Unilat With Pelvis 2-3 Views Right  Result Date: 12/27/2019 CLINICAL DATA:  Fall 6 days ago. EXAM: DG HIP (WITH OR WITHOUT PELVIS) 2-3V RIGHT COMPARISON:  None FINDINGS: There is a comminuted displaced subcapital right hip fracture. No dislocation of the  femoral head is noted. The femoral head appears to be rotated within the acetabulum. No other bony abnormalities are noted. IMPRESSION: Comminuted displaced subcapital right hip fracture with no identified dislocation. The femoral head does appear to be rotated within the acetabulum. Electronically Signed   By: Gerome Sam III M.D   On: 12/27/2019 17:23    Subjective: Patient was feeling better when seen today.  No new complaint.  Ready to go to rehab.  Discharge Exam: Vitals:   01/02/20 0757 01/02/20 0920  BP: 99/63 114/79  Pulse: 86   Resp: 18   Temp: 98 F (36.7 C)   SpO2: 93%    Vitals:   01/01/20 1200 01/01/20 2339 01/02/20 0757 01/02/20 0920  BP: (!) 94/58 (!) 92/59 99/63 114/79  Pulse: 87 74 86   Resp:  20 18   Temp:  98.2 F (36.8 C) 98 F (36.7 C)   TempSrc:  Oral    SpO2:  93% 93%   Weight:      Height:        General: Pt is alert, awake, not in acute distress Cardiovascular: RRR, S1/S2 +, no rubs, no gallops Respiratory: CTA bilaterally, no wheezing, no rhonchi Abdominal: Soft, NT, ND, bowel sounds + Extremities: Bilateral lower extremity wrapped in Ace wrap.   The results of significant diagnostics from this hospitalization (including imaging,  microbiology, ancillary and laboratory) are listed below for reference.    Microbiology: Recent Results (from the past 240 hour(s))  SARS Coronavirus 2 by RT PCR (hospital order, performed in [facility] hospital lab) Nasopharyngeal Nasopharyngeal Swab     Status: None   Collection Time: 12/27/19  4:10 PM   Specimen: Nasopharyngeal Swab  Result Value Ref Range Status   SARS Coronavirus 2 NEGATIVE NEGATIVE Final    Comment: (NOTE) SARS-CoV-2 target nucleic acids are NOT DETECTED.  The SARS-CoV-2 RNA is generally detectable in upper and lower respiratory specimens during the acute phase of infection. The lowest concentration of SARS-CoV-2 viral copies this assay can detect is 250 copies / mL. A negative result does not preclude SARS-CoV-2 infection and should not be used as the sole basis for treatment or other patient management decisions.  A negative result may occur with improper specimen collection / handling, submission of specimen other than nasopharyngeal swab, presence of viral mutation(s) within the areas targeted by this assay, and inadequate number of viral copies (<250 copies / mL). A negative result must be combined with clinical observations, patient history, and epidemiological information.  Fact Sheet for Patients:   BoilerBrush.com.cy  Fact Sheet for Healthcare Providers: https://pope.com/  This test is not yet approved or  cleared by the [place] FDA and has been authorized for detection and/or diagnosis of SARS-CoV-2 by FDA under an Emergency Use Authorization (EUA).  This EUA will remain in effect (meaning this test can be used) for the duration of the COVID-19 declaration under Section 564(b)(1) of the Act, 21 U.S.C. section 360bbb-3(b)(1), unless the authorization is terminated or revoked sooner.  Performed at [facility], [address] [place], [place] [address]      Labs: BNP (last 3  results) No results for input(s): BNP in the last 8760 hours. Basic Metabolic Panel: Recent Labs  Lab 12/29/19 0426 12/30/19 0534 12/31/19 0532 01/01/20 0418 01/02/20 0612  NA 136 136 136 137 137  K 4.3 4.1 3.9 3.8 3.5  CL 97* 97* 98 97* 96*  CO2 32 34*  GLUCOSE 106* 139*  111* 103* 97  BUN 32* 35* 39* 38* 32*  CREATININE 0.95 1.15 0.93 0.91 0.80  CALCIUM 9.2 8.9 8.7* 8.9 8.8*  MG 2.2 2.1 2.1 2.0 2.1   Liver Function Tests: Recent Labs  Lab 12/27/19 1610  AST 29  ALT 24  ALKPHOS 96  BILITOT 1.9*  PROT 8.2*  ALBUMIN 3.4*   No results for input(s): LIPASE, AMYLASE in the last 168 hours. No results for input(s): AMMONIA in the last 168 hours. CBC: Recent Labs  Lab 12/29/19 0426 12/30/19 0534 12/31/19 0532 01/01/20 0418 01/02/20 0612  WBC 7.2 10.7* 9.8 8.8 8.1  NEUTROABS 5.1 9.2* 7.7 6.3 5.8  HGB 12.5* 11.6* 10.8* 10.2* 10.0*  HCT 38.8* 33.9* 32.2* 31.1* 30.1*  MCV 89.0 86.0 86.1 88.6 89.1  PLT 251 245 245 234 242   Cardiac Enzymes: No results for input(s): CKTOTAL, CKMB, CKMBINDEX, TROPONINI in the last 168 hours. BNP: Invalid input(s): POCBNP CBG: No results for input(s): GLUCAP in the last 168 hours. D-Dimer No results for input(s): DDIMER in the last 72 hours. Hgb A1c No results for input(s): HGBA1C in the last 72 hours. Lipid Profile No results for input(s): CHOL, HDL, LDLCALC, TRIG, CHOLHDL, LDLDIRECT in the last 72 hours. Thyroid function studies No results for input(s): TSH, T4TOTAL, T3FREE, THYROIDAB in the last 72 hours.  Invalid input(s): FREET3 Anemia work up Recent Labs    12/31/19 0532  VITAMINB12 564  FOLATE 20.5  FERRITIN 573*  TIBC 190*  IRON 55  RETICCTPCT 1.4   Urinalysis    Component Value Date/Time   COLORURINE AMBER (A) 06/04/2017 1135   APPEARANCEUR CLOUDY (A) 06/04/2017 1135   LABSPEC 1.016 06/04/2017 1135   PHURINE 5.0 06/04/2017 1135   GLUCOSEU NEGATIVE 06/04/2017 1135   HGBUR MODERATE (A) 06/04/2017 1135    BILIRUBINUR NEGATIVE 06/04/2017 1135   KETONESUR NEGATIVE 06/04/2017 1135   PROTEINUR 30 (A) 06/04/2017 1135   NITRITE NEGATIVE 06/04/2017 1135   LEUKOCYTESUR TRACE (A) 06/04/2017 1135   Sepsis Labs Invalid input(s): PROCALCITONIN,  WBC,  LACTICIDVEN Microbiology Recent Results (from the past 240 hour(s))  SARS Coronavirus 2 by RT PCR (hospital order, performed in [facility] hospital lab) Nasopharyngeal Nasopharyngeal Swab     Status: None   Collection Time: 12/27/19  4:10 PM   Specimen: Nasopharyngeal Swab  Result Value Ref Range Status   SARS Coronavirus 2 NEGATIVE NEGATIVE Final    Comment: (NOTE) SARS-CoV-2 target nucleic acids are NOT DETECTED.  The SARS-CoV-2 RNA is generally detectable in upper and lower respiratory specimens during the acute phase of infection. The lowest concentration of SARS-CoV-2 viral copies this assay can detect is 250 copies / mL. A negative result does not preclude SARS-CoV-2 infection and should not be used as the sole basis for treatment or other patient management decisions.  A negative result may occur with improper specimen collection / handling, submission of specimen other than nasopharyngeal swab, presence of viral mutation(s) within the areas targeted by this assay, and inadequate number of viral copies (<250 copies / mL). A negative result must be combined with clinical observations, patient history, and epidemiological information.  Fact Sheet for Patients:   BoilerBrush.com.cy  Fact Sheet for Healthcare Providers: https://pope.com/  This test is not yet approved or  cleared by the [place] FDA and has been authorized for detection and/or diagnosis of SARS-CoV-2 by FDA under an Emergency Use Authorization (EUA).  This EUA will remain in effect (meaning this test can be used) for the duration  of the COVID-19 declaration under Section 564(b)(1) of the Act, 21 U.S.C. section  360bbb-3(b)(1), unless the authorization is terminated or revoked sooner.  Performed at [facility], [address] [place], [place] [address]     Time coordinating discharge: Over 30 minutes  SIGNED:  Arnetha Courser, MD  [facility] 01/02/2020, 10:42 AM  If 7PM-7AM, please contact night-coverage [email or web address]  This record has been created using Conservation officer, historic buildings. Errors have been sought and corrected,but may not always be located. Such creation errors do not reflect on the standard of care.

## 2020-01-02 NOTE — Progress Notes (Signed)
Subjective: 4 Days Post-Op Procedure(s) (LRB): ARTHROPLASTY BIPOLAR HIP (HEMIARTHROPLASTY) (Right) Patient reports pain as mild.   Patient is well, and has had no acute complaints or problems Plan is to go Rehab after hospital stay. Care management has sent paperwork for approval to discharge to the [facility] in [place]. Negative for chest pain and shortness of breath Fever: no Gastrointestinal: Negative for nausea and vomiting  Objective: Vital signs in last 24 hours: Temp:  [98.2 F (36.8 C)] 98.2 F (36.8 C) (09/22 2339) Pulse Rate:  [74-87] 74 (09/22 2339) Resp:  [16-20] 20 (09/22 2339) BP: (92-117)/(58-71) 92/59 (09/22 2339) SpO2:  [93 %] 93 % (09/22 2339)  Intake/Output from previous day:  Intake/Output Summary (Last 24 hours) at 01/02/2020 0754 Last data filed at 01/01/2020 1857 Gross per 24 hour  Intake 600 ml  Output 750 ml  Net -150 ml    Intake/Output this shift: No intake/output data recorded.  Labs: Recent Labs    12/31/19 0532 01/01/20 0418 01/02/20 0612  HGB 10.8* 10.2* 10.0*   Recent Labs    01/01/20 0418 01/02/20 0612  WBC 8.8 8.1  RBC 3.51* 3.38*  HCT 31.1* 30.1*  PLT 234 242   Recent Labs    01/01/20 0418 01/02/20 0612  NA 137 137  K 3.8 3.5  CL 97* 96*  CO2 32 34*  BUN 38* 32*  CREATININE 0.91 0.80  GLUCOSE 103* 97  CALCIUM 8.9 8.8*   No results for input(s): LABPT, INR in the last 72 hours.   EXAM General - Patient is Alert, Appropriate and Oriented Extremity - Neurovascular intact Sensation intact distally Dorsiflexion/Plantar flexion intact Dressing/Incision - Moderate blood tinged drainage to the right hip this AM. Motor Function - intact, moving foot and toes well on exam.  Unna boots intact to lower extremities.  Past Medical History:  Diagnosis Date  . Alcohol use   . Arrhythmia    atrial fibrillation  . Cellulitis   . Chronic atrial fibrillation (HCC)    a. CHADS2VASc 5 (CHF, HTN, age x 2, vascular  disease)  . Chronic systolic CHF (congestive heart failure) (HCC)    a. TTE 1/19: EF 50%, mild LVH, trivial MR/PR/TR, small pericardial effusion  . Chronic venous stasis   . GI bleed   . Hypertension   . Obstructive sleep apnea     Assessment/Plan: 4 Days Post-Op Procedure(s) (LRB): ARTHROPLASTY BIPOLAR HIP (HEMIARTHROPLASTY) (Right) Principal Problem:   Closed displaced fracture of right femoral neck (HCC) Active Problems:   HTN (hypertension)   Atrial fibrillation (HCC)   Fall   Chronic cutaneous venous stasis ulcer (HCC)  Estimated body mass index is 30.38 kg/m as calculated from the following:   Height as of this encounter: 6' (1.829 m).   Weight as of this encounter: 101.6 kg. Advance diet Up with therapy D/C IV fluids   Labs reviewed this AM. Patient has had a BM. Plan is for discharge to SNF. Continue with PT today.  Upon discharge, patient will need to follow-up with [facility] in 10-14 days for staple removal. Continue Eliquis on discharge.  DVT Prophylaxis - TED hose and Eliquis Weight-Bearing as tolerated to right leg  J. Horris Latino, PA-C Orthopaedic Surgery 01/02/2020, 7:54 AM

## 2021-03-11 DEATH — deceased

## 2021-08-24 IMAGING — CR DG CHEST 2V
2 series · 2 of 2 positions shown · non-contrast
Comparison: 06/06/2017

CLINICAL DATA: Cough and congestion

EXAM:
CHEST - 2 VIEW

[chest pa]
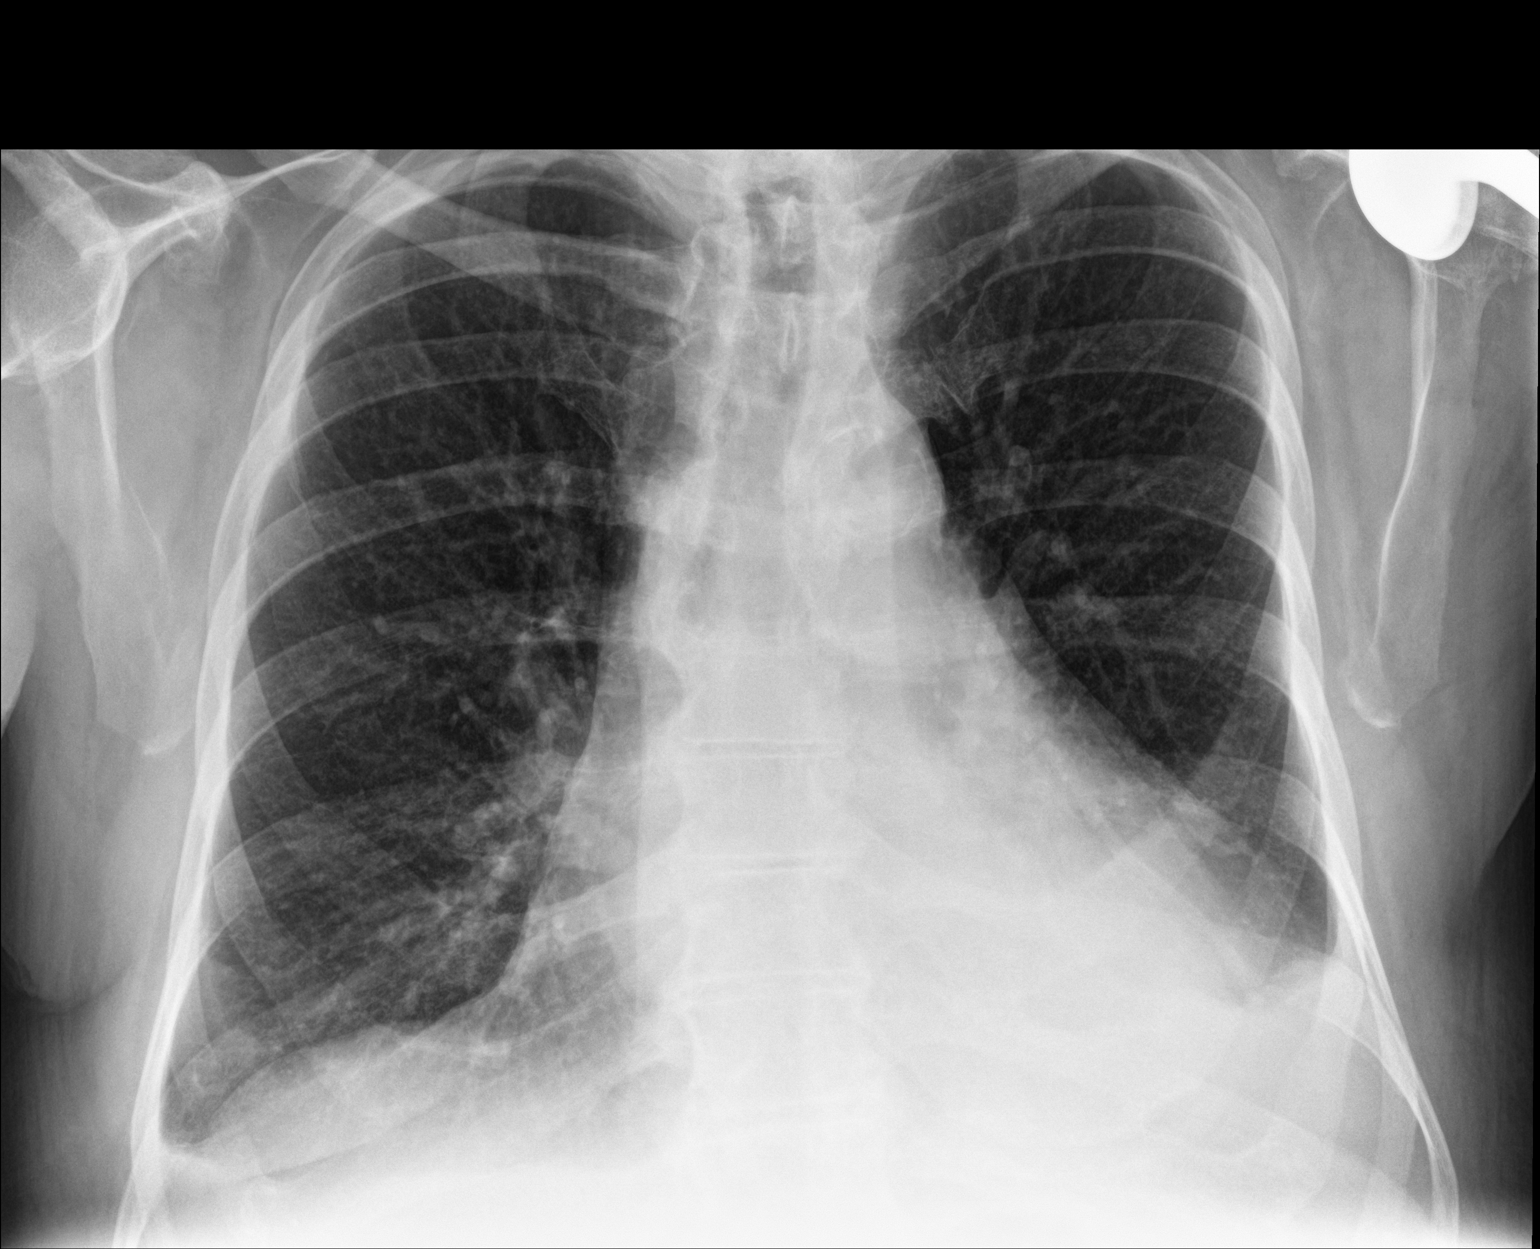

[chest lat]
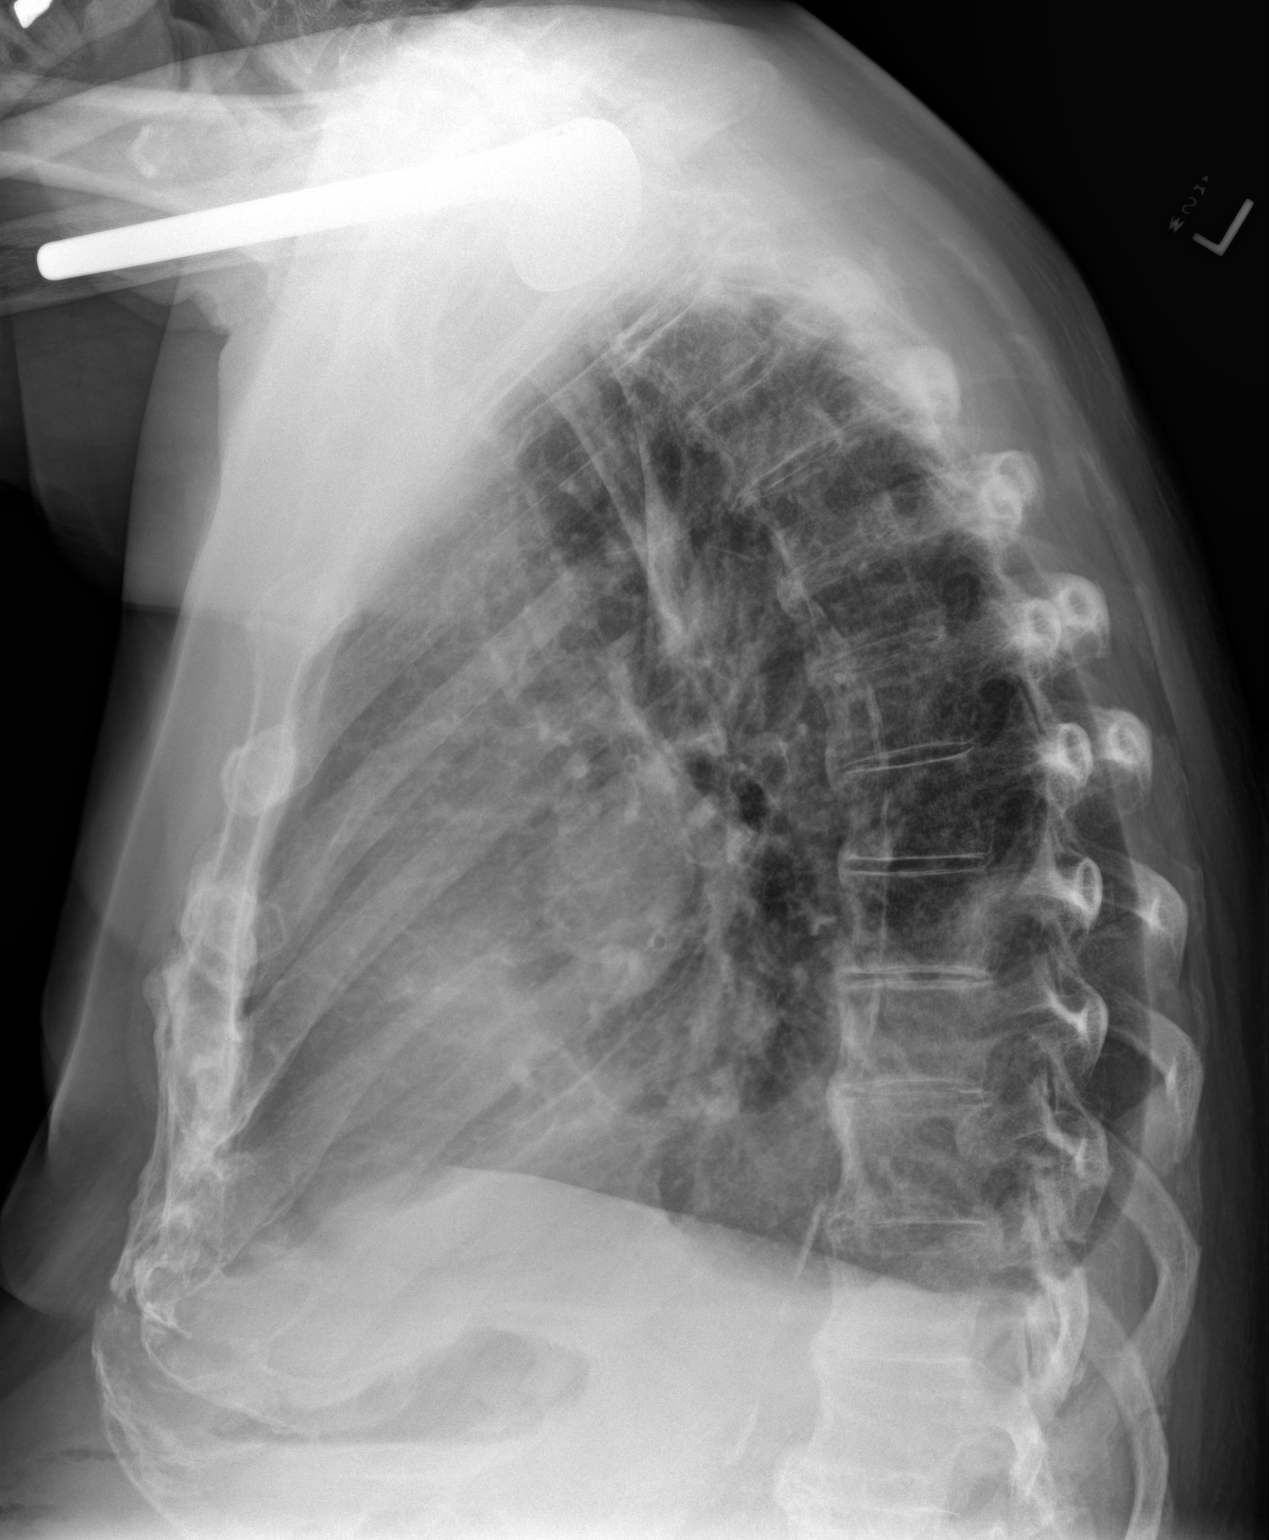

[2 of 2 positions shown; findings below may reference images not displayed]

FINDINGS: Left shoulder replacement. Small left greater than right pleural
effusions. Airspace disease at the left base. Cardiomegaly. Aortic
atherosclerosis. No pneumothorax.
IMPRESSION: 1. Small left greater than right pleural effusions. Airspace disease
at the left lung base, suspect pneumonia
2. Mild cardiomegaly

## 2022-09-09 IMAGING — DX DG HIP (WITH OR WITHOUT PELVIS) 2-3V*R*
2 series · 3 of 3 positions shown · non-contrast
Comparison: 12/27/2019

CLINICAL DATA: Right hip arthroplasty.

EXAM:
DG HIP (WITH OR WITHOUT PELVIS) 2-3V RIGHT

[Series 1: pelvis ap · 0.14mm/px · 2 of 2 slices shown]
[im 1/2]
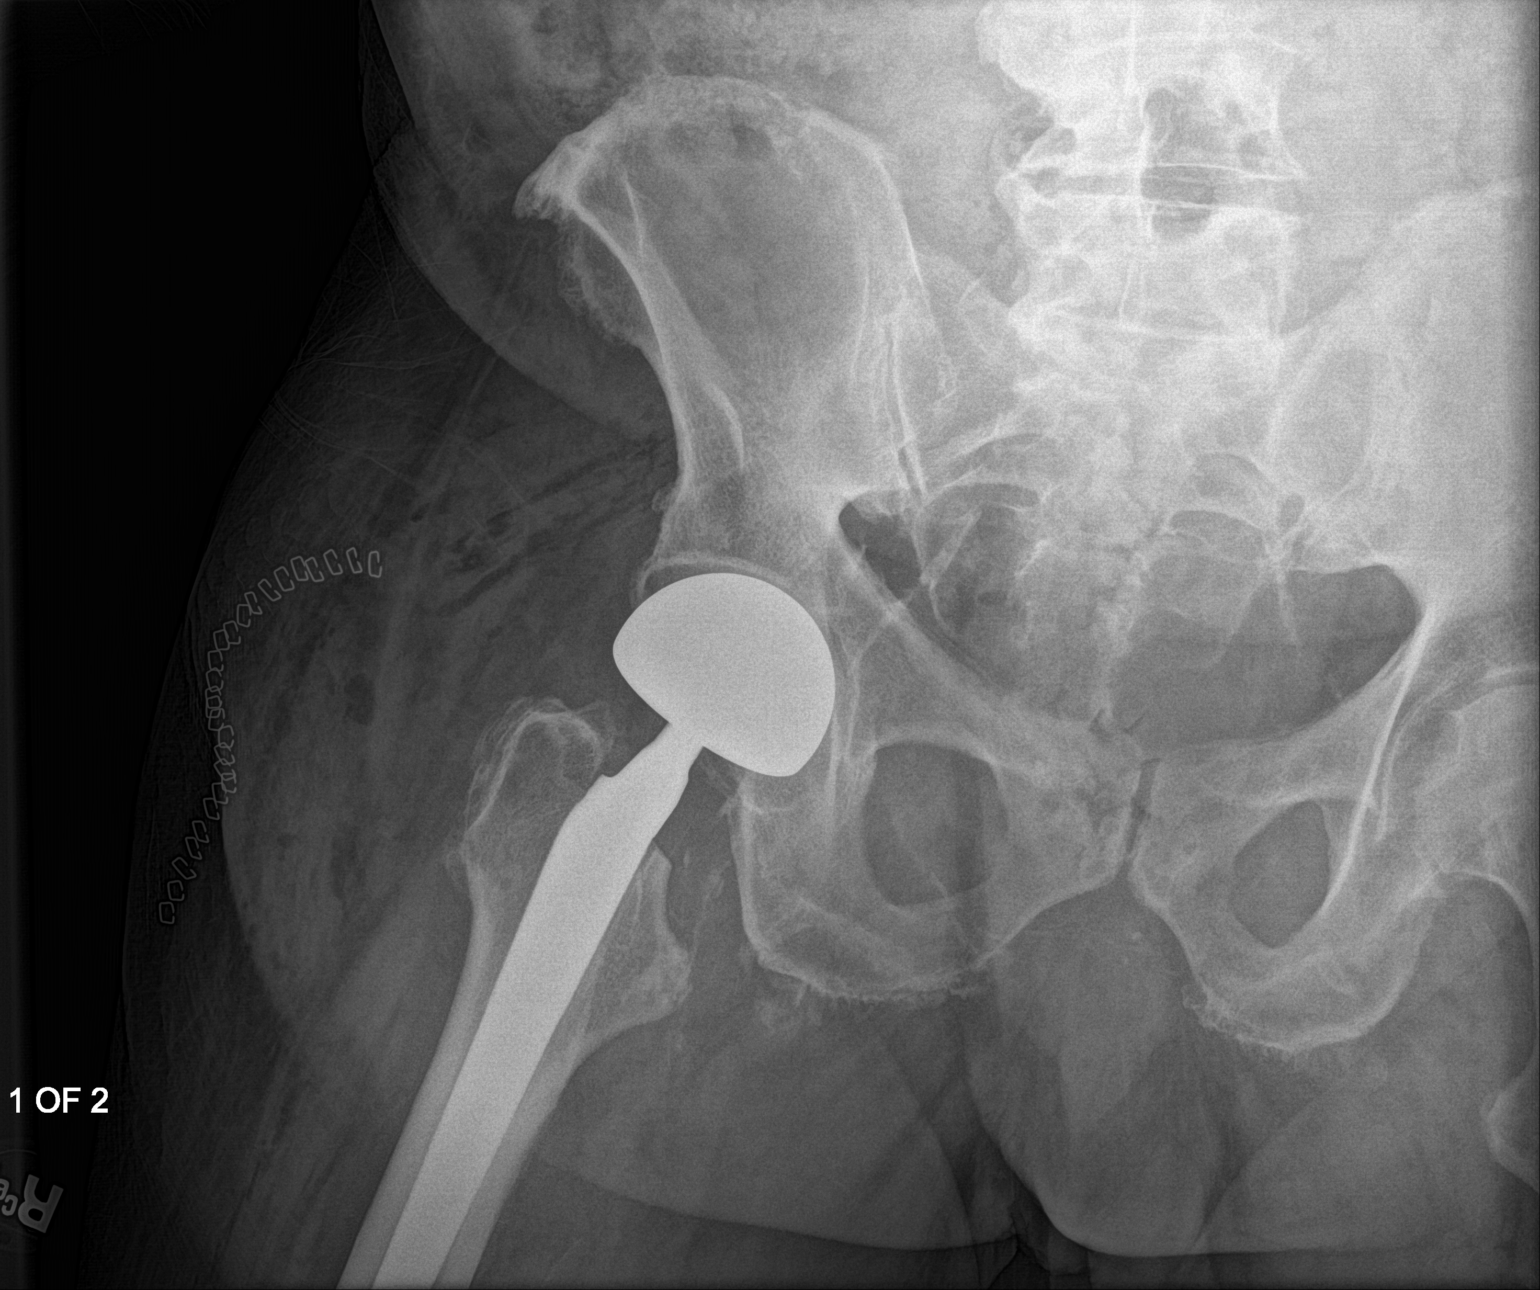
[im 2/2]
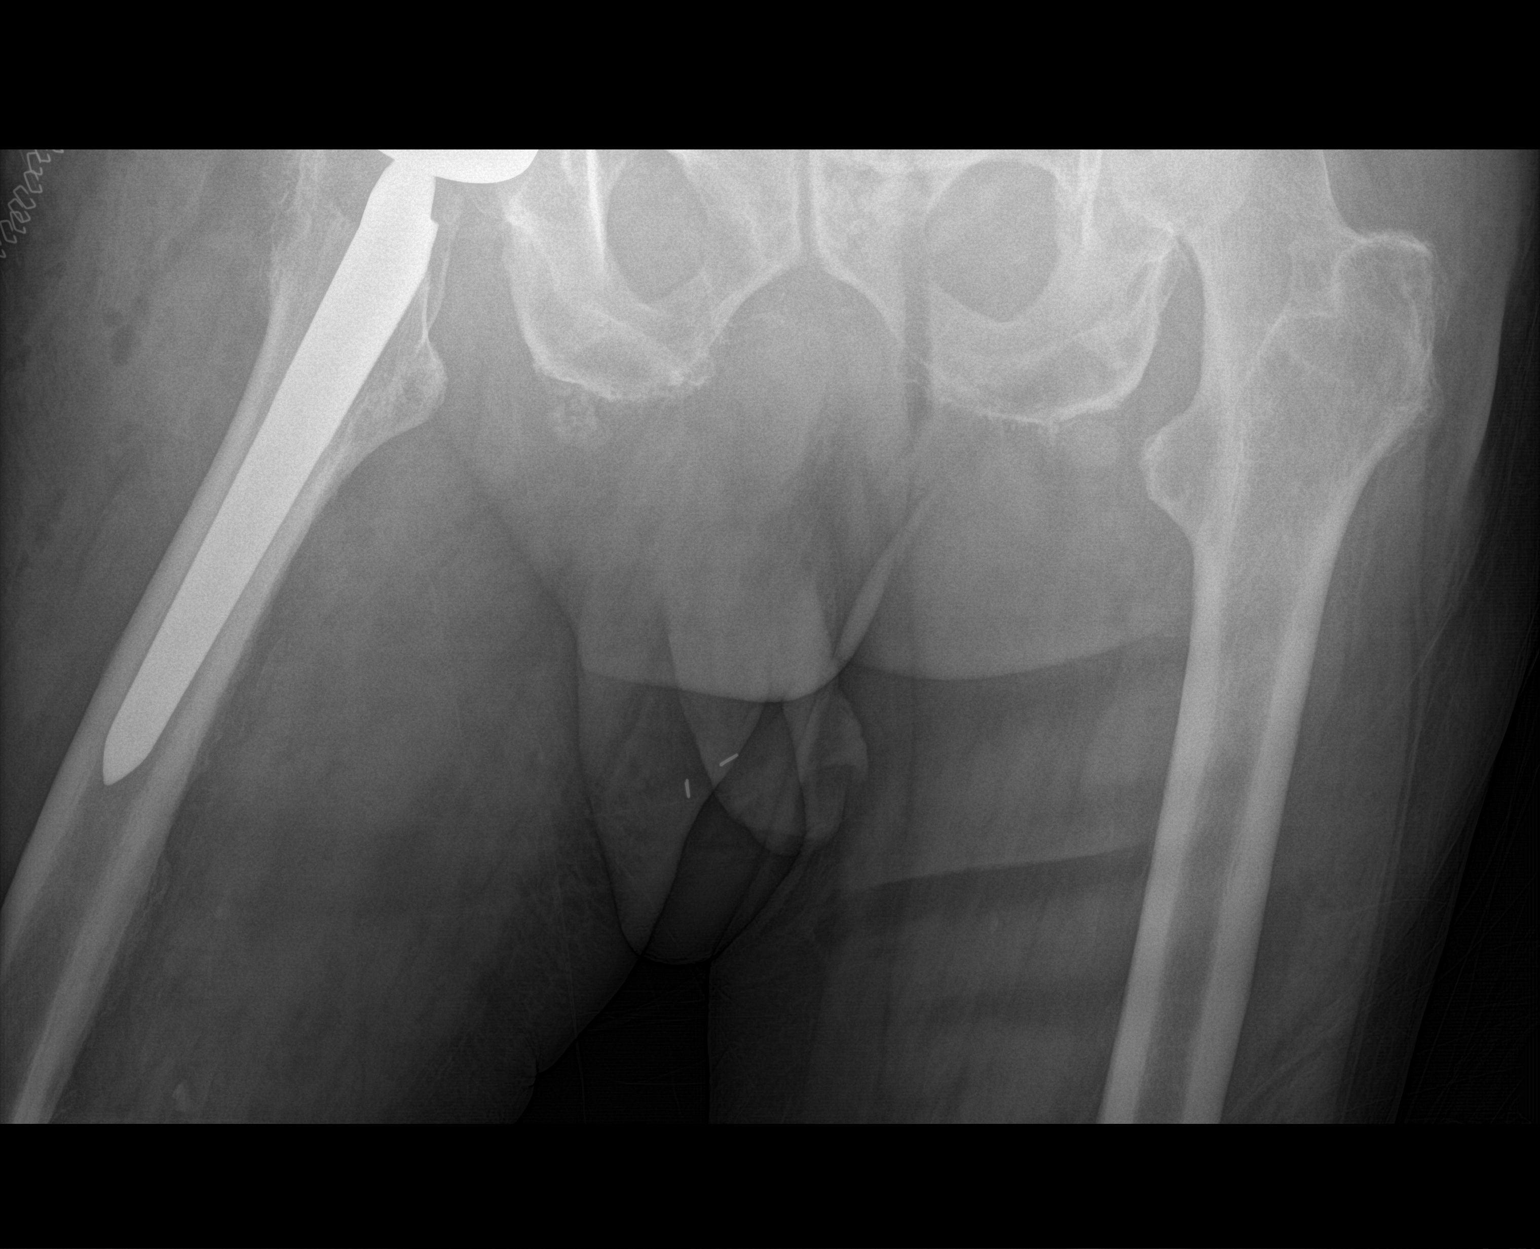

[hip lat]
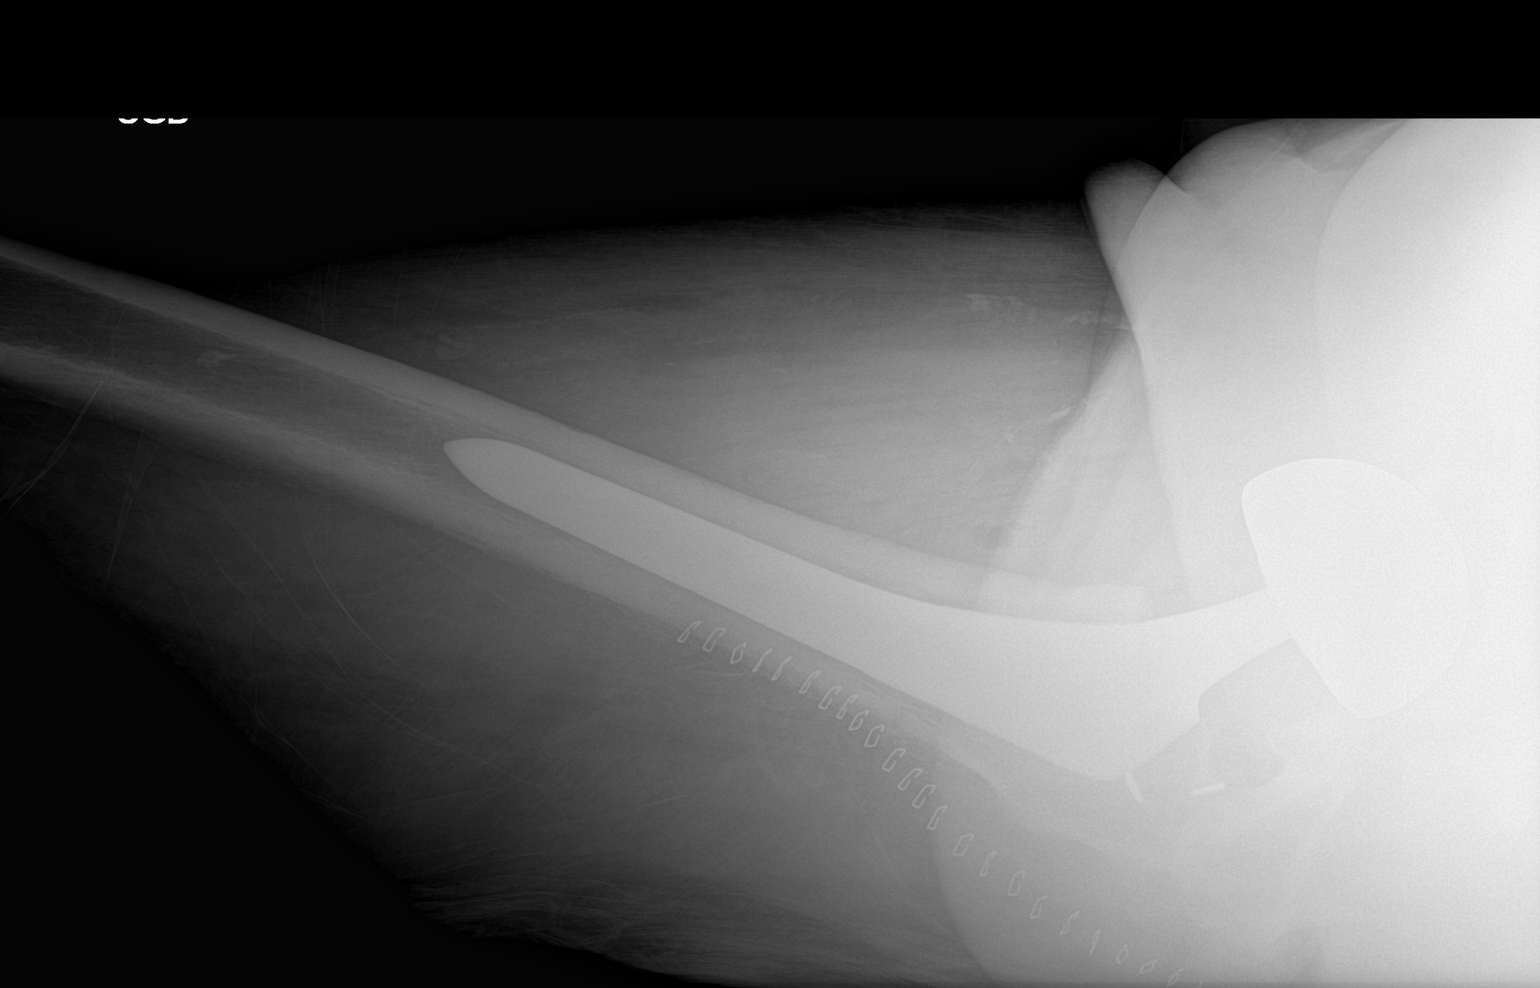

[3 of 3 positions shown; findings below may reference images not displayed]

FINDINGS: Expected changes post right hip arthroplasty with prosthesis intact
and normally located. Remainder of the exam is unchanged.
IMPRESSION: Expected changes post right hip arthroplasty.

## 2022-09-12 IMAGING — US US EXTREM LOW VENOUS*R*
1 series · 13 of 24 positions shown · non-contrast
Comparison: None.

CLINICAL DATA: Right lower extremity edema, recent fall, status
post right hip arthroplasty 12/29/2019



[Series 1: us venous img lower uni right (dvt) · portal-venous · 13 of 33 slices shown]
[im 1/33]
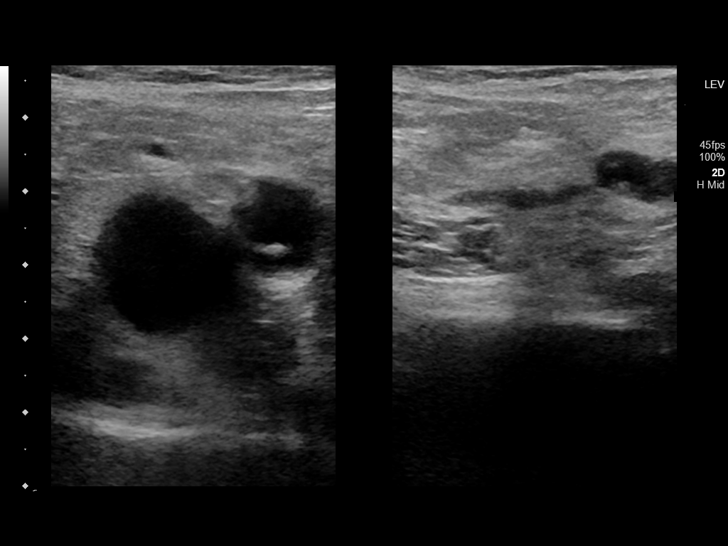
[im 3/33]
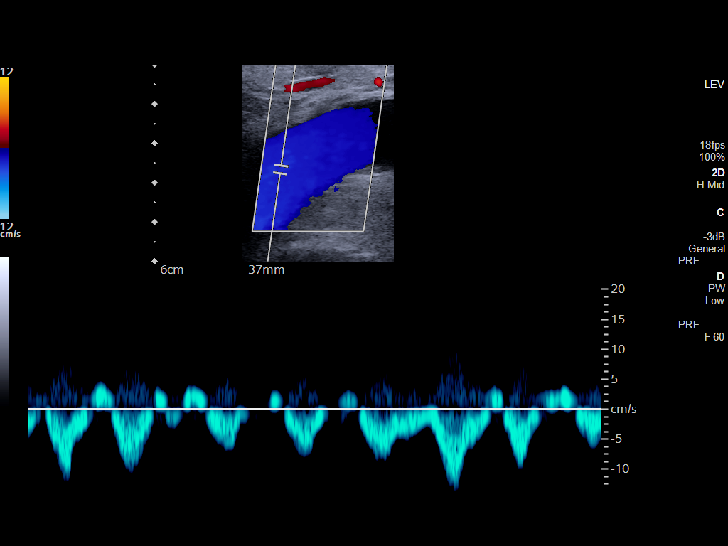
[im 6/33]
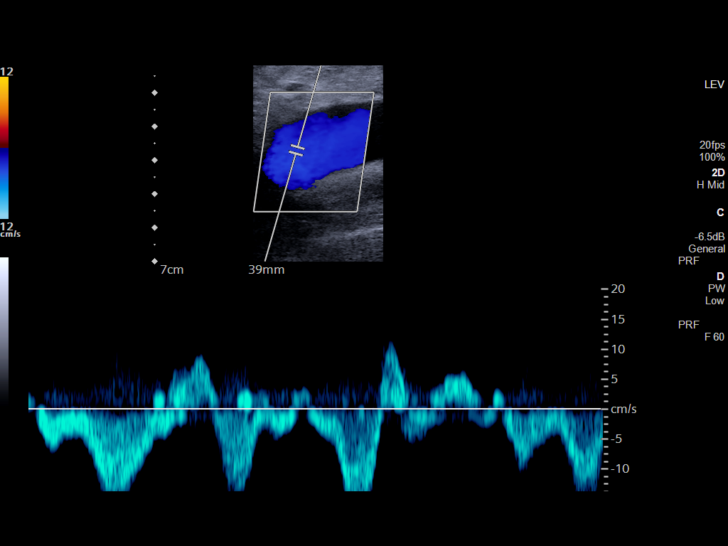
[im 9/33]
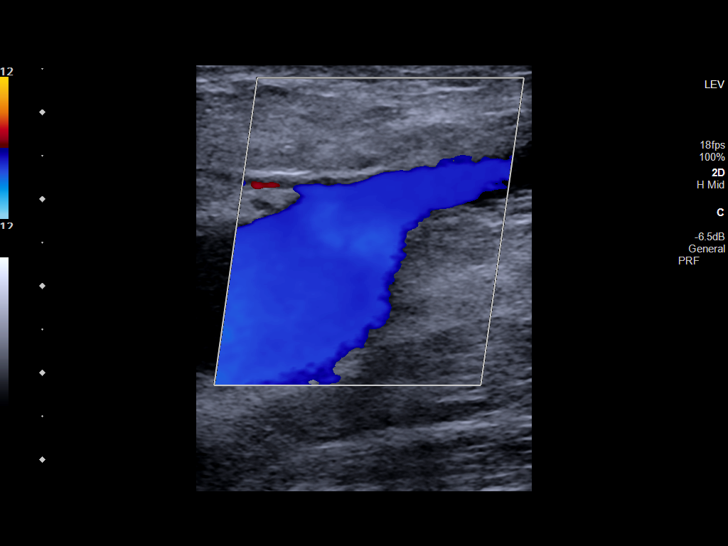
[im 12/33]
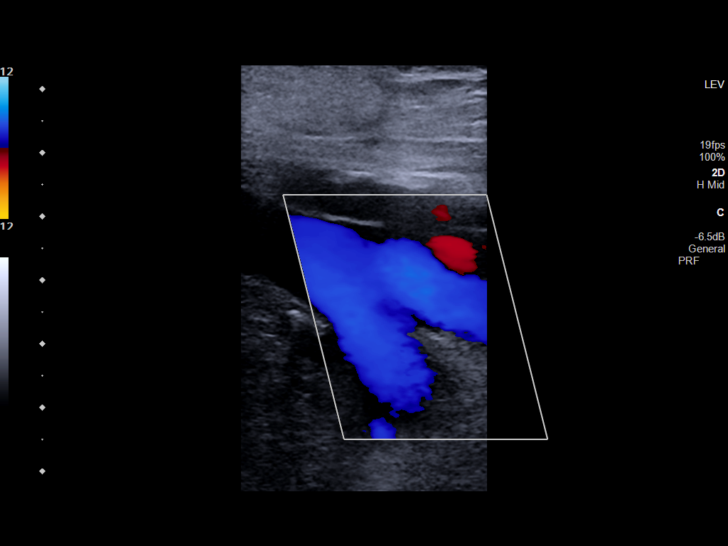
[im 14/33]
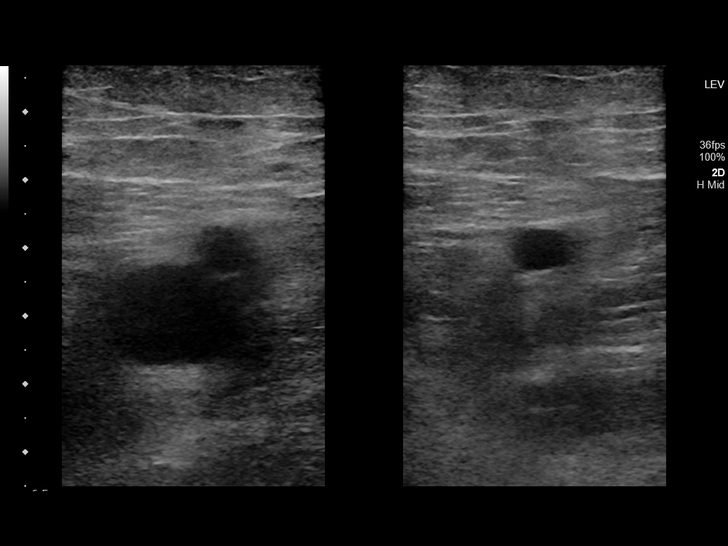
[im 17/33]
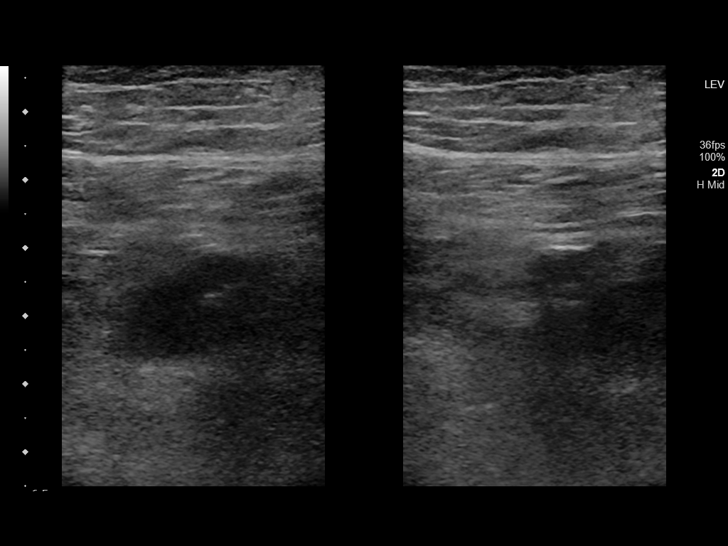
[im 19/33]
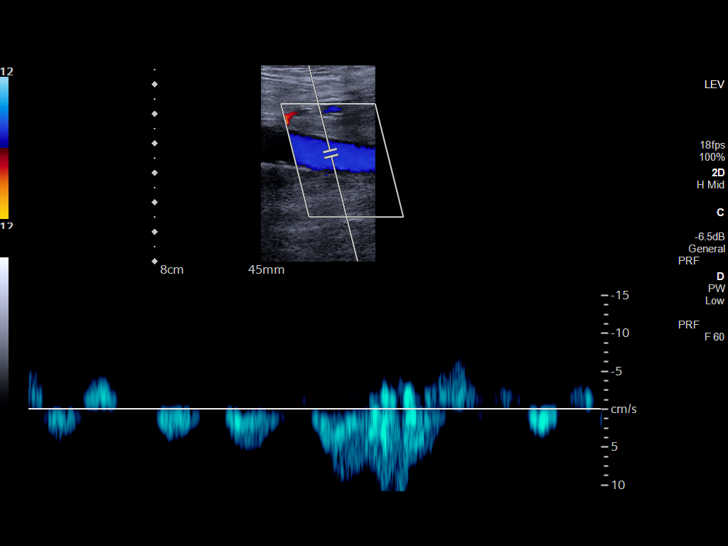
[im 21/33]
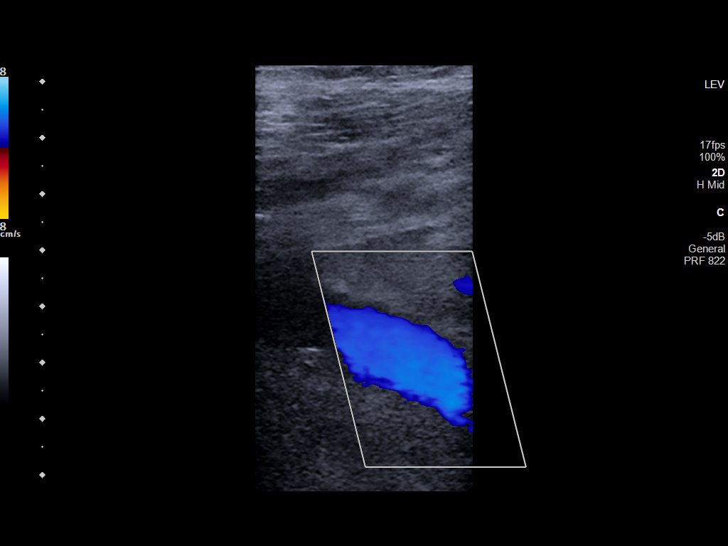
[im 24/33]
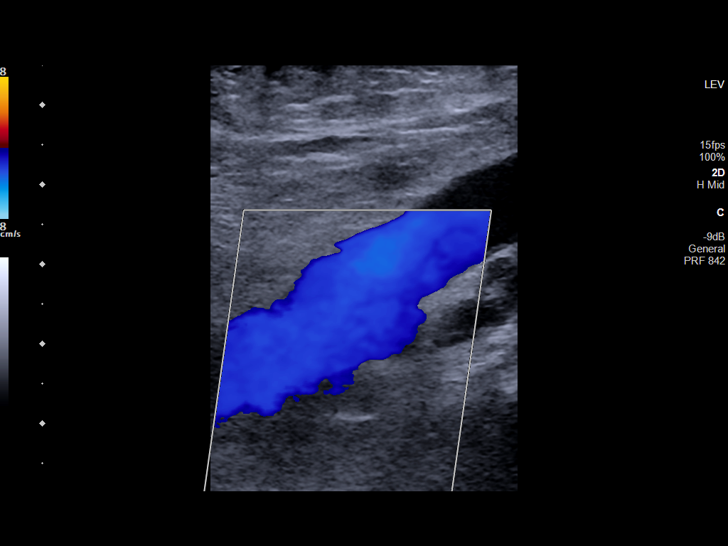
[im 27/33]
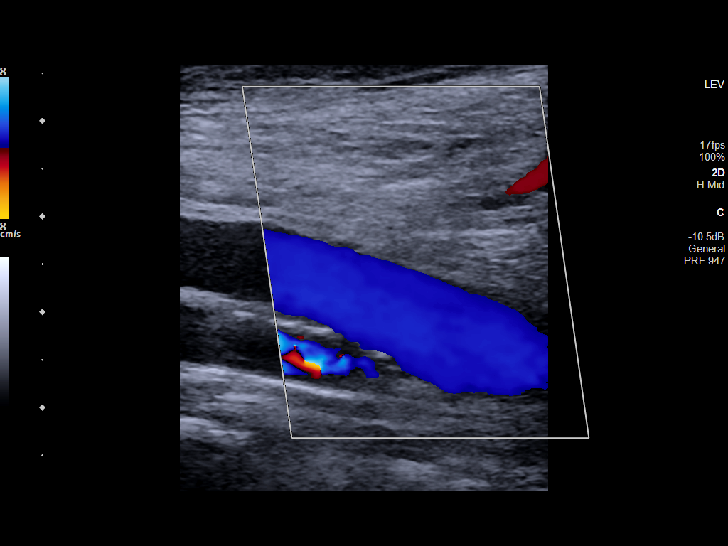
[im 30/33]
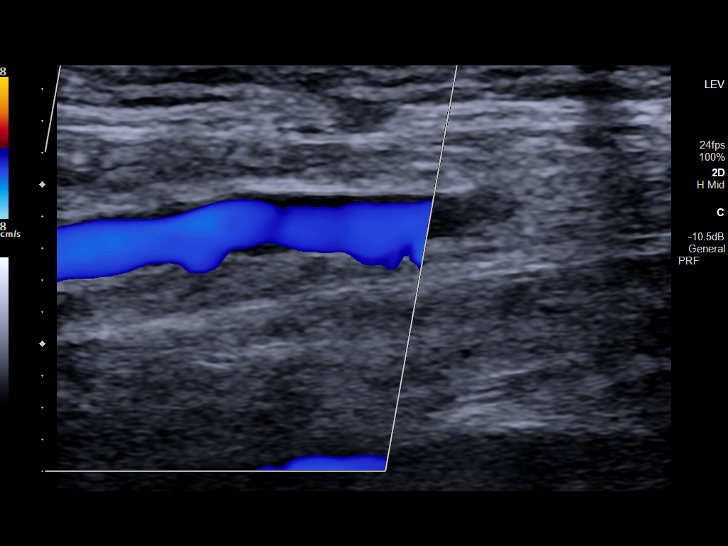
[im 33/33]
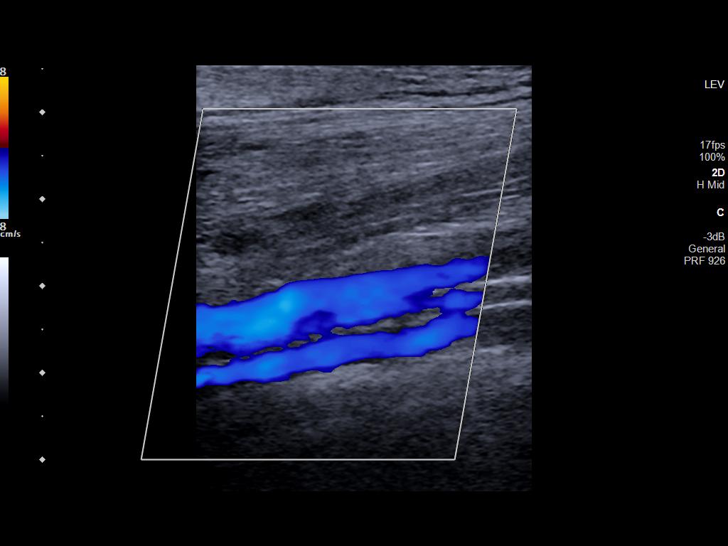

[13 of 24 positions shown; findings below may reference images not displayed]

FINDINGS: Contralateral Common Femoral Vein: Respiratory phasicity is normal
and symmetric with the symptomatic side. No evidence of thrombus.
Normal compressibility.

Common Femoral Vein: No evidence of thrombus. Normal
compressibility, respiratory phasicity and response to augmentation.

Saphenofemoral Junction: No evidence of thrombus. Normal
compressibility and flow on color Doppler imaging.

Profunda Femoral Vein: No evidence of thrombus. Normal
compressibility and flow on color Doppler imaging.

Femoral Vein: No evidence of thrombus. Normal compressibility,
respiratory phasicity and response to augmentation.

Popliteal Vein: No evidence of thrombus. Normal compressibility,
respiratory phasicity and response to augmentation.

Calf Veins: No evidence of thrombus. Normal compressibility and flow
on color Doppler imaging.
IMPRESSION: No evidence of deep venous thrombosis.
